# Patient Record
Sex: Male | Born: 1940
Health system: Southern US, Community
[De-identification: ages and names within clinical notes are randomized; demographics above are authoritative.]

## PROBLEM LIST (undated history)

## (undated) DIAGNOSIS — E1151 Type 2 diabetes mellitus with diabetic peripheral angiopathy without gangrene: Secondary | ICD-10-CM

## (undated) DIAGNOSIS — N529 Male erectile dysfunction, unspecified: Secondary | ICD-10-CM

## (undated) DIAGNOSIS — K7581 Nonalcoholic steatohepatitis (NASH): Secondary | ICD-10-CM

## (undated) DIAGNOSIS — G4730 Sleep apnea, unspecified: Secondary | ICD-10-CM

## (undated) DIAGNOSIS — M1990 Unspecified osteoarthritis, unspecified site: Secondary | ICD-10-CM

## (undated) DIAGNOSIS — D649 Anemia, unspecified: Secondary | ICD-10-CM

## (undated) DIAGNOSIS — E11319 Type 2 diabetes mellitus with unspecified diabetic retinopathy without macular edema: Secondary | ICD-10-CM

## (undated) DIAGNOSIS — E785 Hyperlipidemia, unspecified: Secondary | ICD-10-CM

## (undated) DIAGNOSIS — D126 Benign neoplasm of colon, unspecified: Secondary | ICD-10-CM

## (undated) DIAGNOSIS — K529 Noninfective gastroenteritis and colitis, unspecified: Secondary | ICD-10-CM

## (undated) DIAGNOSIS — E042 Nontoxic multinodular goiter: Secondary | ICD-10-CM

## (undated) HISTORY — PX: APPENDECTOMY: SHX54

## (undated) HISTORY — DX: Noninfective gastroenteritis and colitis, unspecified: K52.9

## (undated) HISTORY — DX: Benign neoplasm of colon, unspecified: D12.6

## (undated) HISTORY — DX: Unspecified osteoarthritis, unspecified site: M19.90

## (undated) HISTORY — DX: Sleep apnea, unspecified: G47.30

## (undated) HISTORY — DX: Nontoxic multinodular goiter: E04.2

## (undated) HISTORY — DX: Nonalcoholic steatohepatitis (NASH): K75.81

## (undated) HISTORY — DX: Hyperlipidemia, unspecified: E78.5

## (undated) HISTORY — DX: Male erectile dysfunction, unspecified: N52.9

## (undated) HISTORY — DX: Type 2 diabetes mellitus with unspecified diabetic retinopathy without macular edema: E11.319

## (undated) HISTORY — DX: Type 2 diabetes mellitus with diabetic peripheral angiopathy without gangrene: E11.51

## (undated) HISTORY — DX: Anemia, unspecified: D64.9

---
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
## 1995-01-31 DIAGNOSIS — K529 Noninfective gastroenteritis and colitis, unspecified: Secondary | ICD-10-CM

## 1995-01-31 HISTORY — DX: Noninfective gastroenteritis and colitis, unspecified: K52.9

## 1998-07-27 ENCOUNTER — Ambulatory Visit (HOSPITAL_COMMUNITY): Admission: RE | Admit: 1998-07-27 | Discharge: 1998-07-27 | Payer: Self-pay | Admitting: Internal Medicine

## 1998-07-27 ENCOUNTER — Encounter: Payer: Self-pay | Admitting: Internal Medicine

## 1999-11-17 ENCOUNTER — Encounter (INDEPENDENT_AMBULATORY_CARE_PROVIDER_SITE_OTHER): Payer: Self-pay | Admitting: Specialist

## 1999-11-17 ENCOUNTER — Other Ambulatory Visit: Admission: RE | Admit: 1999-11-17 | Discharge: 1999-11-17 | Payer: Self-pay | Admitting: Gastroenterology

## 1999-11-17 ENCOUNTER — Encounter: Payer: Self-pay | Admitting: Gastroenterology

## 2002-08-14 ENCOUNTER — Ambulatory Visit (HOSPITAL_BASED_OUTPATIENT_CLINIC_OR_DEPARTMENT_OTHER): Admission: RE | Admit: 2002-08-14 | Discharge: 2002-08-14 | Payer: Self-pay | Admitting: Internal Medicine

## 2002-12-28 ENCOUNTER — Inpatient Hospital Stay (HOSPITAL_COMMUNITY): Admission: EM | Admit: 2002-12-28 | Discharge: 2002-12-31 | Payer: Self-pay | Admitting: Emergency Medicine

## 2002-12-30 ENCOUNTER — Encounter (INDEPENDENT_AMBULATORY_CARE_PROVIDER_SITE_OTHER): Payer: Self-pay | Admitting: *Deleted

## 2002-12-31 ENCOUNTER — Encounter (INDEPENDENT_AMBULATORY_CARE_PROVIDER_SITE_OTHER): Payer: Self-pay | Admitting: *Deleted

## 2003-01-12 ENCOUNTER — Ambulatory Visit (HOSPITAL_COMMUNITY): Admission: RE | Admit: 2003-01-12 | Discharge: 2003-01-12 | Payer: Self-pay | Admitting: Endocrinology

## 2003-01-12 ENCOUNTER — Encounter: Payer: Self-pay | Admitting: Gastroenterology

## 2003-01-27 ENCOUNTER — Encounter: Payer: Self-pay | Admitting: Gastroenterology

## 2003-01-27 ENCOUNTER — Ambulatory Visit (HOSPITAL_COMMUNITY): Admission: RE | Admit: 2003-01-27 | Discharge: 2003-01-27 | Payer: Self-pay | Admitting: Gastroenterology

## 2003-01-31 HISTORY — PX: THORACOTOMY: SUR1349

## 2003-02-04 ENCOUNTER — Encounter: Payer: Self-pay | Admitting: Gastroenterology

## 2003-02-04 HISTORY — PX: ESOPHAGOGASTRODUODENOSCOPY: SHX1529

## 2003-02-16 ENCOUNTER — Inpatient Hospital Stay (HOSPITAL_COMMUNITY): Admission: EM | Admit: 2003-02-16 | Discharge: 2003-03-09 | Payer: Self-pay | Admitting: Emergency Medicine

## 2003-02-16 ENCOUNTER — Encounter (INDEPENDENT_AMBULATORY_CARE_PROVIDER_SITE_OTHER): Payer: Self-pay | Admitting: *Deleted

## 2003-02-17 ENCOUNTER — Encounter: Payer: Self-pay | Admitting: Gastroenterology

## 2003-02-17 ENCOUNTER — Encounter: Payer: Self-pay | Admitting: Cardiology

## 2003-02-18 ENCOUNTER — Encounter (INDEPENDENT_AMBULATORY_CARE_PROVIDER_SITE_OTHER): Payer: Self-pay | Admitting: *Deleted

## 2003-02-19 ENCOUNTER — Encounter (INDEPENDENT_AMBULATORY_CARE_PROVIDER_SITE_OTHER): Payer: Self-pay | Admitting: *Deleted

## 2003-02-19 ENCOUNTER — Encounter (INDEPENDENT_AMBULATORY_CARE_PROVIDER_SITE_OTHER): Payer: Self-pay | Admitting: Specialist

## 2003-02-24 ENCOUNTER — Encounter (INDEPENDENT_AMBULATORY_CARE_PROVIDER_SITE_OTHER): Payer: Self-pay | Admitting: Specialist

## 2003-03-09 ENCOUNTER — Encounter (INDEPENDENT_AMBULATORY_CARE_PROVIDER_SITE_OTHER): Payer: Self-pay | Admitting: *Deleted

## 2003-03-17 ENCOUNTER — Encounter: Admission: RE | Admit: 2003-03-17 | Discharge: 2003-03-17 | Payer: Self-pay | Admitting: Thoracic Surgery

## 2003-04-01 ENCOUNTER — Encounter: Admission: RE | Admit: 2003-04-01 | Discharge: 2003-04-01 | Payer: Self-pay | Admitting: Thoracic Surgery

## 2003-04-29 ENCOUNTER — Encounter: Admission: RE | Admit: 2003-04-29 | Discharge: 2003-04-29 | Payer: Self-pay | Admitting: Thoracic Surgery

## 2003-06-10 ENCOUNTER — Encounter: Admission: RE | Admit: 2003-06-10 | Discharge: 2003-06-10 | Payer: Self-pay | Admitting: Thoracic Surgery

## 2003-09-09 ENCOUNTER — Encounter: Admission: RE | Admit: 2003-09-09 | Discharge: 2003-09-09 | Payer: Self-pay | Admitting: Thoracic Surgery

## 2003-12-18 ENCOUNTER — Ambulatory Visit: Payer: Self-pay | Admitting: Endocrinology

## 2004-01-15 ENCOUNTER — Ambulatory Visit: Payer: Self-pay | Admitting: Endocrinology

## 2004-01-19 ENCOUNTER — Ambulatory Visit: Payer: Self-pay | Admitting: Endocrinology

## 2004-02-24 ENCOUNTER — Ambulatory Visit: Payer: Self-pay | Admitting: Endocrinology

## 2004-04-06 ENCOUNTER — Ambulatory Visit: Payer: Self-pay | Admitting: Endocrinology

## 2004-05-19 ENCOUNTER — Ambulatory Visit: Payer: Self-pay | Admitting: Endocrinology

## 2004-05-26 ENCOUNTER — Ambulatory Visit: Payer: Self-pay | Admitting: Endocrinology

## 2004-06-01 ENCOUNTER — Ambulatory Visit (HOSPITAL_COMMUNITY): Admission: RE | Admit: 2004-06-01 | Discharge: 2004-06-01 | Payer: Self-pay | Admitting: Endocrinology

## 2004-06-22 ENCOUNTER — Ambulatory Visit: Payer: Self-pay | Admitting: Pulmonary Disease

## 2004-08-18 ENCOUNTER — Ambulatory Visit: Payer: Self-pay | Admitting: Endocrinology

## 2004-08-25 ENCOUNTER — Ambulatory Visit: Payer: Self-pay | Admitting: Endocrinology

## 2004-12-08 ENCOUNTER — Ambulatory Visit: Payer: Self-pay | Admitting: Endocrinology

## 2004-12-14 ENCOUNTER — Ambulatory Visit: Payer: Self-pay | Admitting: Endocrinology

## 2005-01-04 ENCOUNTER — Ambulatory Visit: Payer: Self-pay | Admitting: Endocrinology

## 2005-01-30 HISTORY — PX: CORONARY ARTERY BYPASS GRAFT: SHX141

## 2005-02-14 ENCOUNTER — Ambulatory Visit: Payer: Self-pay | Admitting: Endocrinology

## 2005-02-23 ENCOUNTER — Ambulatory Visit: Payer: Self-pay | Admitting: Endocrinology

## 2005-05-04 ENCOUNTER — Ambulatory Visit: Payer: Self-pay | Admitting: Endocrinology

## 2005-05-09 ENCOUNTER — Ambulatory Visit: Payer: Self-pay | Admitting: Endocrinology

## 2005-08-01 ENCOUNTER — Ambulatory Visit: Payer: Self-pay | Admitting: Endocrinology

## 2005-08-08 ENCOUNTER — Ambulatory Visit: Payer: Self-pay | Admitting: Endocrinology

## 2005-10-18 IMAGING — NM NM HEPATO W/GB/PHARM/[PERSON_NAME]
1 series · 6 of 6 positions shown · non-contrast
Comparison: No prior nuclear medicine studies.  Abdominal ultrasound of 12/30/02 from [HOSPITAL] is correlated.

CLINICAL DATA: 62 year old with right upper quadrant abdominal pain, nausea/vomiting, and weight loss.

NUCLEAR MEDICINE HEPATOBILIARY IMAGING WITH GALLBLADDER EJECTION FRACTION DETERMINATION ? 01/27/03
TECHNIQUE: 8.4 millicuries of technetium ZZm-Sholetec were administered intravenously and static imaging over the right upper quadrant of the abdomen was performed at 5 minute intervals [DATE] minutes.  Imaging was also performed at 45 minutes and 1 hour.  At this point, the patient received an infusion of 2.0 micrograms of cholecystokinin and gallbladder ejection fraction was measured at 30 minutes and 60 minutes.

[hepatobiliary · 1.20mm/px · 6 of 8 frames shown]
[frame 1/8]
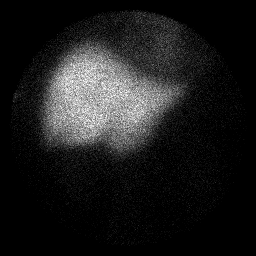
[frame 2/8]
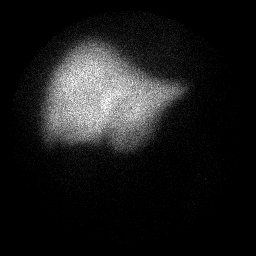
[frame 4/8]
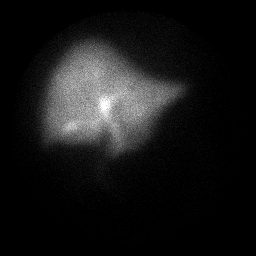
[frame 5/8]
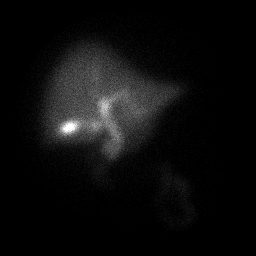
[frame 6/8]
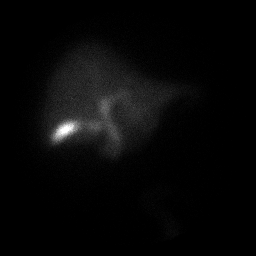
[frame 8/8]
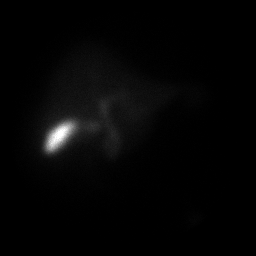

[6 of 6 positions shown; findings below may reference images not displayed]

FINDINGS: Hepatic uptake of the Choletec is normal.  Intrahepatic bile ducts are visualized by 10 minutes.  The common duct and small bowel activity is present by 20 minutes.  Gallbladder activity is noted by 20 minutes as well.  There is then progressive filling of the gallbladder throughout the remainder of the imaging sequence.

After cholecystokinin infusion, there is no excretion of the radiopharmaceutical from the gallbladder.  In fact, there continues to be progressive filling of the gallbladder, with the ejection fraction measured at -14.4 percent at one hour.

IMPRESSION
1.  Severe biliary dyskinesia, with absolutely no gallbladder response to cholecystokinin infusion.
2.  Patent common bile duct and cystic duct.

## 2005-10-31 ENCOUNTER — Ambulatory Visit: Payer: Self-pay | Admitting: Endocrinology

## 2005-11-07 ENCOUNTER — Ambulatory Visit: Payer: Self-pay | Admitting: Endocrinology

## 2006-01-09 ENCOUNTER — Ambulatory Visit: Payer: Self-pay | Admitting: Internal Medicine

## 2006-02-09 ENCOUNTER — Ambulatory Visit: Payer: Self-pay | Admitting: Endocrinology

## 2006-02-09 LAB — CONVERTED CEMR LAB
BUN: 23 mg/dL (ref 6–23)
CO2: 29 meq/L (ref 19–32)
Calcium: 8.7 mg/dL (ref 8.4–10.5)
Chloride: 108 meq/L (ref 96–112)
Creatinine, Ser: 1.1 mg/dL (ref 0.4–1.5)
Hgb A1c MFr Bld: 7.7 % — ABNORMAL HIGH (ref 4.6–6.0)
Potassium: 4.7 meq/L (ref 3.5–5.1)
Uric Acid, Serum: 10.1 mg/dL — ABNORMAL HIGH (ref 2.4–7.0)

## 2006-05-21 ENCOUNTER — Ambulatory Visit: Payer: Self-pay | Admitting: Endocrinology

## 2006-05-30 ENCOUNTER — Ambulatory Visit: Payer: Self-pay | Admitting: Pulmonary Disease

## 2006-06-05 ENCOUNTER — Ambulatory Visit: Payer: Self-pay | Admitting: Pulmonary Disease

## 2006-06-21 ENCOUNTER — Ambulatory Visit: Payer: Self-pay | Admitting: Endocrinology

## 2006-06-29 ENCOUNTER — Ambulatory Visit: Payer: Self-pay | Admitting: Internal Medicine

## 2006-06-29 LAB — CONVERTED CEMR LAB: TSH: 2.19 microintl units/mL (ref 0.35–5.50)

## 2006-07-05 ENCOUNTER — Ambulatory Visit: Payer: Self-pay

## 2006-07-11 ENCOUNTER — Ambulatory Visit: Payer: Self-pay | Admitting: Cardiology

## 2006-07-12 ENCOUNTER — Encounter (HOSPITAL_COMMUNITY): Admission: RE | Admit: 2006-07-12 | Discharge: 2006-07-13 | Payer: Self-pay | Admitting: Internal Medicine

## 2006-08-15 ENCOUNTER — Encounter: Payer: Self-pay | Admitting: Endocrinology

## 2006-08-15 DIAGNOSIS — E785 Hyperlipidemia, unspecified: Secondary | ICD-10-CM | POA: Insufficient documentation

## 2006-08-15 DIAGNOSIS — M1990 Unspecified osteoarthritis, unspecified site: Secondary | ICD-10-CM | POA: Insufficient documentation

## 2006-08-16 ENCOUNTER — Ambulatory Visit: Payer: Self-pay | Admitting: Endocrinology

## 2006-08-16 LAB — CONVERTED CEMR LAB
Basophils Relative: 1.2 % — ABNORMAL HIGH (ref 0.0–1.0)
Bilirubin Urine: NEGATIVE
Bilirubin, Direct: 0.1 mg/dL (ref 0.0–0.3)
CO2: 28 meq/L (ref 19–32)
Creatinine, Ser: 1.2 mg/dL (ref 0.4–1.5)
GFR calc Af Amer: 78 mL/min
HCT: 36.9 % — ABNORMAL LOW (ref 39.0–52.0)
HDL: 29.7 mg/dL — ABNORMAL LOW (ref >39.0)
Hemoglobin, Urine: NEGATIVE
Hemoglobin: 12.7 g/dL — ABNORMAL LOW (ref 13.0–17.0)
Hgb A1c MFr Bld: 7.9 % — ABNORMAL HIGH (ref 4.6–6.0)
Leukocytes, UA: NEGATIVE
Lymphocytes Relative: 20.8 % (ref 12.0–46.0)
MCHC: 34.4 g/dL (ref 30.0–36.0)
Monocytes Absolute: 1.1 10*3/uL — ABNORMAL HIGH (ref 0.2–0.7)
Neutro Abs: 3.7 10*3/uL (ref 1.4–7.7)
Neutrophils Relative %: 56.7 % (ref 43.0–77.0)
Potassium: 4.8 meq/L (ref 3.5–5.1)
RDW: 11.7 % (ref 11.5–14.6)
Sodium: 140 meq/L (ref 135–145)
Specific Gravity, Urine: 1.025 (ref 1.000–1.03)
Total Bilirubin: 0.7 mg/dL (ref 0.3–1.2)
Total Protein: 6.8 g/dL (ref 6.0–8.3)
Triglycerides: 169 mg/dL — ABNORMAL HIGH (ref 0–149)
Uric Acid, Serum: 11.2 mg/dL — ABNORMAL HIGH (ref 2.4–7.0)
Urobilinogen, UA: 0.2 (ref 0.0–1.0)
VLDL: 34 mg/dL (ref 0–40)
pH: 6 (ref 5.0–8.0)

## 2006-08-22 ENCOUNTER — Ambulatory Visit: Payer: Self-pay | Admitting: Endocrinology

## 2006-09-04 ENCOUNTER — Ambulatory Visit: Payer: Self-pay | Admitting: Internal Medicine

## 2006-09-04 LAB — CONVERTED CEMR LAB
BUN: 25 mg/dL — ABNORMAL HIGH (ref 6–23)
Basophils Absolute: 0 10*3/uL (ref 0.0–0.1)
Basophils Relative: 0.5 % (ref 0.0–1.0)
CO2: 28 meq/L (ref 19–32)
Eosinophils Absolute: 0.4 10*3/uL (ref 0.0–0.6)
GFR calc Af Amer: 78 mL/min
Hemoglobin: 12.9 g/dL — ABNORMAL LOW (ref 13.0–17.0)
Lymphocytes Relative: 16.6 % (ref 12.0–46.0)
MCHC: 34.1 g/dL (ref 30.0–36.0)
MCV: 90.7 fL (ref 78.0–100.0)
Monocytes Absolute: 1 10*3/uL — ABNORMAL HIGH (ref 0.2–0.7)
Monocytes Relative: 17 % — ABNORMAL HIGH (ref 3.0–11.0)
Neutro Abs: 3.5 10*3/uL (ref 1.4–7.7)
Potassium: 4.3 meq/L (ref 3.5–5.1)

## 2006-09-06 ENCOUNTER — Ambulatory Visit: Payer: Self-pay | Admitting: Cardiology

## 2006-09-06 ENCOUNTER — Inpatient Hospital Stay (HOSPITAL_BASED_OUTPATIENT_CLINIC_OR_DEPARTMENT_OTHER): Admission: RE | Admit: 2006-09-06 | Discharge: 2006-09-06 | Payer: Self-pay | Admitting: Cardiology

## 2006-09-07 ENCOUNTER — Ambulatory Visit: Payer: Self-pay | Admitting: Cardiology

## 2006-09-07 ENCOUNTER — Encounter: Payer: Self-pay | Admitting: Thoracic Surgery (Cardiothoracic Vascular Surgery)

## 2006-09-07 ENCOUNTER — Ambulatory Visit: Payer: Self-pay | Admitting: Thoracic Surgery (Cardiothoracic Vascular Surgery)

## 2006-09-07 ENCOUNTER — Ambulatory Visit (HOSPITAL_COMMUNITY): Admission: RE | Admit: 2006-09-07 | Discharge: 2006-09-08 | Payer: Self-pay | Admitting: Cardiology

## 2006-09-17 ENCOUNTER — Inpatient Hospital Stay (HOSPITAL_COMMUNITY)
Admission: RE | Admit: 2006-09-17 | Discharge: 2006-09-26 | Payer: Self-pay | Admitting: Thoracic Surgery (Cardiothoracic Vascular Surgery)

## 2006-09-24 ENCOUNTER — Ambulatory Visit: Payer: Self-pay | Admitting: Physical Medicine & Rehabilitation

## 2006-10-10 ENCOUNTER — Ambulatory Visit: Payer: Self-pay | Admitting: Internal Medicine

## 2006-10-12 ENCOUNTER — Encounter
Admission: RE | Admit: 2006-10-12 | Discharge: 2006-10-12 | Payer: Self-pay | Admitting: Thoracic Surgery (Cardiothoracic Vascular Surgery)

## 2006-10-12 ENCOUNTER — Ambulatory Visit: Payer: Self-pay | Admitting: Thoracic Surgery (Cardiothoracic Vascular Surgery)

## 2006-10-18 ENCOUNTER — Encounter (HOSPITAL_COMMUNITY): Admission: RE | Admit: 2006-10-18 | Discharge: 2007-01-16 | Payer: Self-pay | Admitting: Internal Medicine

## 2006-11-12 ENCOUNTER — Ambulatory Visit: Payer: Self-pay | Admitting: Thoracic Surgery (Cardiothoracic Vascular Surgery)

## 2006-12-10 ENCOUNTER — Telehealth (INDEPENDENT_AMBULATORY_CARE_PROVIDER_SITE_OTHER): Payer: Self-pay | Admitting: *Deleted

## 2006-12-12 ENCOUNTER — Ambulatory Visit: Payer: Self-pay | Admitting: Endocrinology

## 2006-12-12 LAB — CONVERTED CEMR LAB
AST: 23 units/L (ref 0–37)
Basophils Relative: 1.4 % — ABNORMAL HIGH (ref 0.0–1.0)
Bilirubin, Direct: 0.1 mg/dL (ref 0.0–0.3)
Chloride: 108 meq/L (ref 96–112)
Cholesterol: 124 mg/dL (ref 0–200)
Creatinine, Ser: 1.1 mg/dL (ref 0.4–1.5)
Eosinophils Relative: 3.7 % (ref 0.0–5.0)
Glucose, Bld: 183 mg/dL — ABNORMAL HIGH (ref 70–99)
HCT: 37.9 % — ABNORMAL LOW (ref 39.0–52.0)
Hgb A1c MFr Bld: 7.7 % — ABNORMAL HIGH (ref 4.6–6.0)
Ketones, ur: NEGATIVE mg/dL
LDL Cholesterol: 72 mg/dL (ref 0–99)
Neutrophils Relative %: 66.8 % (ref 43.0–77.0)
Nitrite: NEGATIVE
PSA: 0.25 ng/mL (ref 0.10–4.00)
RBC: 4.25 M/uL (ref 4.22–5.81)
RDW: 14.3 % (ref 11.5–14.6)
Sodium: 142 meq/L (ref 135–145)
Total Bilirubin: 0.7 mg/dL (ref 0.3–1.2)
Urobilinogen, UA: 0.2 (ref 0.0–1.0)
VLDL: 26 mg/dL (ref 0–40)
WBC: 7.3 10*3/uL (ref 4.5–10.5)
pH: 5.5 (ref 5.0–8.0)

## 2006-12-17 ENCOUNTER — Ambulatory Visit: Payer: Self-pay | Admitting: Endocrinology

## 2006-12-31 ENCOUNTER — Encounter: Payer: Self-pay | Admitting: Endocrinology

## 2007-01-15 ENCOUNTER — Ambulatory Visit: Payer: Self-pay | Admitting: Internal Medicine

## 2007-01-23 ENCOUNTER — Ambulatory Visit: Payer: Self-pay | Admitting: Endocrinology

## 2007-01-23 ENCOUNTER — Inpatient Hospital Stay (HOSPITAL_COMMUNITY): Admission: AD | Admit: 2007-01-23 | Discharge: 2007-01-27 | Payer: Self-pay | Admitting: Endocrinology

## 2007-01-23 DIAGNOSIS — N139 Obstructive and reflux uropathy, unspecified: Secondary | ICD-10-CM | POA: Insufficient documentation

## 2007-01-24 ENCOUNTER — Encounter (INDEPENDENT_AMBULATORY_CARE_PROVIDER_SITE_OTHER): Payer: Self-pay | Admitting: *Deleted

## 2007-02-11 ENCOUNTER — Ambulatory Visit: Payer: Self-pay | Admitting: Endocrinology

## 2007-02-11 LAB — CONVERTED CEMR LAB
Basophils Relative: 0 % (ref 0.0–1.0)
Crystals: NEGATIVE
Eosinophils Absolute: 0.3 10*3/uL (ref 0.0–0.6)
Eosinophils Relative: 4.9 % (ref 0.0–5.0)
Ferritin: 260.8 ng/mL (ref 22.0–322.0)
Folate: 10.6 ng/mL
HCT: 37.1 % — ABNORMAL LOW (ref 39.0–52.0)
Hemoglobin, Urine: NEGATIVE
Iron: 70 ug/dL (ref 42–165)
Leukocytes, UA: NEGATIVE
Lymphocytes Relative: 18.9 % (ref 12.0–46.0)
MCV: 88.9 fL (ref 78.0–100.0)
Mucus, UA: NEGATIVE
Neutrophils Relative %: 63.4 % (ref 43.0–77.0)
RBC: 4.17 M/uL — ABNORMAL LOW (ref 4.22–5.81)
Total Protein, Urine: NEGATIVE mg/dL
Vitamin B-12: 615 pg/mL (ref 211–911)
WBC: 6.3 10*3/uL (ref 4.5–10.5)

## 2007-02-12 LAB — CONVERTED CEMR LAB
Calcium, Total (PTH): 8.8 mg/dL (ref 8.4–10.5)
PTH: 53.5 pg/mL (ref 14.0–72.0)

## 2007-07-19 ENCOUNTER — Telehealth: Payer: Self-pay | Admitting: Pulmonary Disease

## 2007-07-19 ENCOUNTER — Telehealth: Payer: Self-pay | Admitting: Endocrinology

## 2007-08-05 ENCOUNTER — Ambulatory Visit: Payer: Self-pay | Admitting: Endocrinology

## 2007-08-05 LAB — CONVERTED CEMR LAB: Hgb A1c MFr Bld: 9.1 % — ABNORMAL HIGH (ref 4.6–6.0)

## 2007-08-07 ENCOUNTER — Ambulatory Visit: Payer: Self-pay | Admitting: Internal Medicine

## 2007-08-08 ENCOUNTER — Telehealth (INDEPENDENT_AMBULATORY_CARE_PROVIDER_SITE_OTHER): Payer: Self-pay | Admitting: *Deleted

## 2007-08-08 ENCOUNTER — Ambulatory Visit: Payer: Self-pay | Admitting: Endocrinology

## 2007-08-14 ENCOUNTER — Ambulatory Visit: Payer: Self-pay | Admitting: Internal Medicine

## 2007-08-30 ENCOUNTER — Encounter: Payer: Self-pay | Admitting: Endocrinology

## 2007-09-07 ENCOUNTER — Encounter: Payer: Self-pay | Admitting: Endocrinology

## 2007-09-11 ENCOUNTER — Telehealth (INDEPENDENT_AMBULATORY_CARE_PROVIDER_SITE_OTHER): Payer: Self-pay | Admitting: *Deleted

## 2007-09-30 ENCOUNTER — Ambulatory Visit: Payer: Self-pay | Admitting: Endocrinology

## 2007-09-30 LAB — CONVERTED CEMR LAB
BUN: 18 mg/dL (ref 6–23)
Creatinine, Ser: 1 mg/dL (ref 0.4–1.5)
GFR calc Af Amer: 96 mL/min
GFR calc non Af Amer: 80 mL/min
Glucose, Bld: 215 mg/dL — ABNORMAL HIGH (ref 70–99)

## 2007-10-03 ENCOUNTER — Ambulatory Visit: Payer: Self-pay | Admitting: Endocrinology

## 2007-10-20 ENCOUNTER — Encounter: Payer: Self-pay | Admitting: Pulmonary Disease

## 2007-11-07 ENCOUNTER — Ambulatory Visit: Payer: Self-pay | Admitting: Endocrinology

## 2007-12-12 ENCOUNTER — Ambulatory Visit: Payer: Self-pay | Admitting: Endocrinology

## 2007-12-12 LAB — CONVERTED CEMR LAB
Basophils Absolute: 0.1 10*3/uL (ref 0.0–0.1)
Basophils Relative: 0.8 % (ref 0.0–3.0)
CO2: 31 meq/L (ref 19–32)
Calcium: 9.2 mg/dL (ref 8.4–10.5)
Chloride: 101 meq/L (ref 96–112)
Creatinine, Ser: 1.4 mg/dL (ref 0.4–1.5)
Eosinophils Absolute: 0.4 10*3/uL (ref 0.0–0.7)
GFR calc non Af Amer: 54 mL/min
Hgb A1c MFr Bld: 8 % — ABNORMAL HIGH (ref 4.6–6.0)
Lymphocytes Relative: 19.4 % (ref 12.0–46.0)
MCHC: 34.1 g/dL (ref 30.0–36.0)
MCV: 90.6 fL (ref 78.0–100.0)
Neutro Abs: 5 10*3/uL (ref 1.4–7.7)
Neutrophils Relative %: 60.5 % (ref 43.0–77.0)
RDW: 12 % (ref 11.5–14.6)
Sodium: 139 meq/L (ref 135–145)

## 2008-01-08 ENCOUNTER — Telehealth: Payer: Self-pay | Admitting: Pulmonary Disease

## 2008-01-15 ENCOUNTER — Ambulatory Visit: Payer: Self-pay | Admitting: Internal Medicine

## 2008-01-15 ENCOUNTER — Encounter: Payer: Self-pay | Admitting: Internal Medicine

## 2008-01-15 LAB — CONVERTED CEMR LAB: Blood Glucose, Fingerstick: 212

## 2008-02-05 ENCOUNTER — Ambulatory Visit: Payer: Self-pay | Admitting: Pulmonary Disease

## 2008-02-05 DIAGNOSIS — G4733 Obstructive sleep apnea (adult) (pediatric): Secondary | ICD-10-CM | POA: Insufficient documentation

## 2008-02-12 ENCOUNTER — Ambulatory Visit: Payer: Self-pay | Admitting: Internal Medicine

## 2008-02-20 ENCOUNTER — Ambulatory Visit: Payer: Self-pay | Admitting: Internal Medicine

## 2008-02-20 ENCOUNTER — Ambulatory Visit: Payer: Self-pay | Admitting: Endocrinology

## 2008-02-20 DIAGNOSIS — E876 Hypokalemia: Secondary | ICD-10-CM | POA: Insufficient documentation

## 2008-02-20 LAB — CONVERTED CEMR LAB
BUN: 35 mg/dL — ABNORMAL HIGH
CO2: 30 meq/L
Calcium: 9 mg/dL
Chloride: 101 meq/L
Creatinine, Ser: 1.3 mg/dL
GFR calc Af Amer: 71 mL/min
GFR calc non Af Amer: 59 mL/min
Glucose, Bld: 184 mg/dL — ABNORMAL HIGH
Hgb A1c MFr Bld: 8.6 % — ABNORMAL HIGH
Potassium: 4.8 meq/L
Sodium: 139 meq/L

## 2008-04-21 ENCOUNTER — Telehealth (INDEPENDENT_AMBULATORY_CARE_PROVIDER_SITE_OTHER): Payer: Self-pay | Admitting: *Deleted

## 2008-04-23 ENCOUNTER — Encounter: Payer: Self-pay | Admitting: Endocrinology

## 2008-06-04 ENCOUNTER — Encounter: Payer: Self-pay | Admitting: Endocrinology

## 2008-06-16 ENCOUNTER — Encounter (INDEPENDENT_AMBULATORY_CARE_PROVIDER_SITE_OTHER): Payer: Self-pay | Admitting: *Deleted

## 2008-06-25 ENCOUNTER — Ambulatory Visit: Payer: Self-pay | Admitting: Endocrinology

## 2008-06-25 DIAGNOSIS — I509 Heart failure, unspecified: Secondary | ICD-10-CM | POA: Insufficient documentation

## 2008-06-25 LAB — CONVERTED CEMR LAB
BUN: 55 mg/dL — ABNORMAL HIGH (ref 6–23)
Basophils Absolute: 0 10*3/uL (ref 0.0–0.1)
Chloride: 103 meq/L (ref 96–112)
Eosinophils Absolute: 0.3 10*3/uL (ref 0.0–0.7)
GFR calc non Af Amer: 46.1 mL/min (ref >60)
Hgb A1c MFr Bld: 8.7 % — ABNORMAL HIGH (ref 4.6–6.5)
Lymphocytes Relative: 20.8 % (ref 12.0–46.0)
MCHC: 35.6 g/dL (ref 30.0–36.0)
Monocytes Relative: 14 % — ABNORMAL HIGH (ref 3.0–12.0)
Neutro Abs: 4.6 10*3/uL (ref 1.4–7.7)
Neutrophils Relative %: 60 % (ref 43.0–77.0)
PTH: 62.8 pg/mL (ref 14.0–72.0)
Platelets: 345 10*3/uL (ref 150.0–400.0)
Potassium: 5.6 meq/L — ABNORMAL HIGH (ref 3.5–5.1)
Pro B Natriuretic peptide (BNP): 21 pg/mL (ref 0.0–100.0)
RDW: 11.7 % (ref 11.5–14.6)
Sodium: 138 meq/L (ref 135–145)

## 2008-07-02 ENCOUNTER — Telehealth: Payer: Self-pay | Admitting: Gastroenterology

## 2008-07-06 ENCOUNTER — Ambulatory Visit: Payer: Self-pay | Admitting: Gastroenterology

## 2008-07-08 ENCOUNTER — Ambulatory Visit: Payer: Self-pay | Admitting: Internal Medicine

## 2008-07-09 ENCOUNTER — Telehealth: Payer: Self-pay | Admitting: Gastroenterology

## 2008-07-10 ENCOUNTER — Telehealth: Payer: Self-pay | Admitting: Gastroenterology

## 2008-07-10 DIAGNOSIS — R933 Abnormal findings on diagnostic imaging of other parts of digestive tract: Secondary | ICD-10-CM | POA: Insufficient documentation

## 2008-07-20 ENCOUNTER — Telehealth (INDEPENDENT_AMBULATORY_CARE_PROVIDER_SITE_OTHER): Payer: Self-pay | Admitting: *Deleted

## 2008-07-22 ENCOUNTER — Encounter: Payer: Self-pay | Admitting: Endocrinology

## 2008-07-29 ENCOUNTER — Encounter: Payer: Self-pay | Admitting: Gastroenterology

## 2008-07-29 ENCOUNTER — Encounter: Admission: RE | Admit: 2008-07-29 | Discharge: 2008-07-29 | Payer: Self-pay | Admitting: Urology

## 2008-08-07 DIAGNOSIS — I2510 Atherosclerotic heart disease of native coronary artery without angina pectoris: Secondary | ICD-10-CM | POA: Insufficient documentation

## 2008-08-13 ENCOUNTER — Ambulatory Visit: Payer: Self-pay | Admitting: Internal Medicine

## 2008-08-26 ENCOUNTER — Telehealth: Payer: Self-pay | Admitting: Gastroenterology

## 2008-09-07 ENCOUNTER — Encounter (HOSPITAL_COMMUNITY): Admission: RE | Admit: 2008-09-07 | Discharge: 2008-11-03 | Payer: Self-pay | Admitting: Internal Medicine

## 2008-09-07 ENCOUNTER — Ambulatory Visit: Payer: Self-pay | Admitting: Cardiology

## 2008-09-08 ENCOUNTER — Encounter: Payer: Self-pay | Admitting: Internal Medicine

## 2008-09-11 ENCOUNTER — Telehealth: Payer: Self-pay | Admitting: Internal Medicine

## 2008-10-01 ENCOUNTER — Ambulatory Visit: Payer: Self-pay | Admitting: Gastroenterology

## 2008-10-01 DIAGNOSIS — K7581 Nonalcoholic steatohepatitis (NASH): Secondary | ICD-10-CM | POA: Insufficient documentation

## 2008-10-01 DIAGNOSIS — E669 Obesity, unspecified: Secondary | ICD-10-CM | POA: Insufficient documentation

## 2008-10-02 ENCOUNTER — Encounter: Payer: Self-pay | Admitting: Gastroenterology

## 2008-10-07 ENCOUNTER — Ambulatory Visit (HOSPITAL_COMMUNITY): Admission: RE | Admit: 2008-10-07 | Discharge: 2008-10-07 | Payer: Self-pay | Admitting: Gastroenterology

## 2008-10-07 ENCOUNTER — Ambulatory Visit: Payer: Self-pay | Admitting: Gastroenterology

## 2008-10-07 ENCOUNTER — Encounter: Payer: Self-pay | Admitting: Gastroenterology

## 2008-10-07 LAB — HM COLONOSCOPY: HM Colonoscopy: NORMAL

## 2008-10-09 ENCOUNTER — Encounter: Payer: Self-pay | Admitting: Gastroenterology

## 2008-11-13 ENCOUNTER — Ambulatory Visit: Payer: Self-pay | Admitting: Endocrinology

## 2008-11-15 LAB — CONVERTED CEMR LAB
ALT: 44 units/L (ref 0–53)
BUN: 23 mg/dL (ref 6–23)
Basophils Relative: 1.4 % (ref 0.0–3.0)
Bilirubin Urine: NEGATIVE
CO2: 30 meq/L (ref 19–32)
Chloride: 102 meq/L (ref 96–112)
Cholesterol: 134 mg/dL (ref 0–200)
Eosinophils Relative: 5.7 % — ABNORMAL HIGH (ref 0.0–5.0)
HCT: 39.1 % (ref 39.0–52.0)
Hemoglobin, Urine: NEGATIVE
Hgb A1c MFr Bld: 7.1 % — ABNORMAL HIGH (ref 4.6–6.5)
Ketones, ur: NEGATIVE mg/dL
LDL Cholesterol: 64 mg/dL (ref 0–99)
Lymphs Abs: 1.6 10*3/uL (ref 0.7–4.0)
MCHC: 33.7 g/dL (ref 30.0–36.0)
MCV: 91.6 fL (ref 78.0–100.0)
Monocytes Absolute: 1 10*3/uL (ref 0.1–1.0)
Platelets: 351 10*3/uL (ref 150.0–400.0)
Potassium: 4.4 meq/L (ref 3.5–5.1)
RBC: 4.26 M/uL (ref 4.22–5.81)
TSH: 2.28 microintl units/mL (ref 0.35–5.50)
Total Protein: 7.1 g/dL (ref 6.0–8.3)
Urine Glucose: NEGATIVE mg/dL
Urobilinogen, UA: 0.2 (ref 0.0–1.0)
WBC: 7.2 10*3/uL (ref 4.5–10.5)

## 2008-11-17 ENCOUNTER — Encounter: Payer: Self-pay | Admitting: Endocrinology

## 2008-11-18 ENCOUNTER — Ambulatory Visit: Payer: Self-pay | Admitting: Endocrinology

## 2008-11-18 DIAGNOSIS — E349 Endocrine disorder, unspecified: Secondary | ICD-10-CM | POA: Insufficient documentation

## 2008-11-20 LAB — CONVERTED CEMR LAB
FSH: 5.2 milliintl units/mL (ref 1.4–18.1)
LH: 3.95 milliintl units/mL (ref 1.50–9.30)
Prolactin: 4.2 ng/mL

## 2008-11-29 ENCOUNTER — Encounter: Payer: Self-pay | Admitting: Endocrinology

## 2008-11-30 ENCOUNTER — Telehealth (INDEPENDENT_AMBULATORY_CARE_PROVIDER_SITE_OTHER): Payer: Self-pay | Admitting: *Deleted

## 2009-01-07 ENCOUNTER — Telehealth: Payer: Self-pay | Admitting: Endocrinology

## 2009-02-02 ENCOUNTER — Telehealth (INDEPENDENT_AMBULATORY_CARE_PROVIDER_SITE_OTHER): Payer: Self-pay | Admitting: *Deleted

## 2009-02-04 ENCOUNTER — Ambulatory Visit: Payer: Self-pay | Admitting: Pulmonary Disease

## 2009-03-05 ENCOUNTER — Telehealth (INDEPENDENT_AMBULATORY_CARE_PROVIDER_SITE_OTHER): Payer: Self-pay | Admitting: *Deleted

## 2009-03-06 ENCOUNTER — Encounter: Payer: Self-pay | Admitting: Endocrinology

## 2009-03-31 ENCOUNTER — Ambulatory Visit: Payer: Self-pay | Admitting: Endocrinology

## 2009-03-31 DIAGNOSIS — E291 Testicular hypofunction: Secondary | ICD-10-CM | POA: Insufficient documentation

## 2009-03-31 LAB — CONVERTED CEMR LAB
Hgb A1c MFr Bld: 7.7 % — ABNORMAL HIGH (ref 4.6–6.5)
Microalb, Ur: 1.1 mg/dL (ref 0.0–1.9)

## 2009-04-14 ENCOUNTER — Telehealth: Payer: Self-pay | Admitting: Endocrinology

## 2009-06-25 ENCOUNTER — Encounter: Payer: Self-pay | Admitting: Pulmonary Disease

## 2009-07-02 ENCOUNTER — Telehealth: Payer: Self-pay | Admitting: Internal Medicine

## 2009-07-05 ENCOUNTER — Encounter (INDEPENDENT_AMBULATORY_CARE_PROVIDER_SITE_OTHER): Payer: Self-pay | Admitting: *Deleted

## 2009-08-13 ENCOUNTER — Telehealth: Payer: Self-pay | Admitting: Endocrinology

## 2009-10-19 ENCOUNTER — Telehealth: Payer: Self-pay | Admitting: Endocrinology

## 2009-10-20 ENCOUNTER — Telehealth: Payer: Self-pay | Admitting: Endocrinology

## 2009-10-20 ENCOUNTER — Ambulatory Visit: Payer: Self-pay | Admitting: Endocrinology

## 2009-10-20 LAB — CONVERTED CEMR LAB
CO2: 27 meq/L (ref 19–32)
Calcium: 9.2 mg/dL (ref 8.4–10.5)
Glucose, Bld: 200 mg/dL — ABNORMAL HIGH (ref 70–99)
Sodium: 139 meq/L (ref 135–145)

## 2009-10-22 ENCOUNTER — Ambulatory Visit: Payer: Self-pay | Admitting: Endocrinology

## 2009-10-22 DIAGNOSIS — I10 Essential (primary) hypertension: Secondary | ICD-10-CM | POA: Insufficient documentation

## 2009-10-22 LAB — CONVERTED CEMR LAB
CO2: 25 meq/L (ref 19–32)
Calcium: 9.6 mg/dL (ref 8.4–10.5)
Glucose, Bld: 293 mg/dL — ABNORMAL HIGH (ref 70–99)
Sodium: 137 meq/L (ref 135–145)

## 2009-11-19 ENCOUNTER — Ambulatory Visit: Payer: Self-pay | Admitting: Endocrinology

## 2009-11-22 ENCOUNTER — Telehealth: Payer: Self-pay | Admitting: Endocrinology

## 2010-01-05 ENCOUNTER — Telehealth: Payer: Self-pay | Admitting: Endocrinology

## 2010-01-06 ENCOUNTER — Ambulatory Visit: Payer: Self-pay | Admitting: Endocrinology

## 2010-01-06 DIAGNOSIS — M109 Gout, unspecified: Secondary | ICD-10-CM | POA: Insufficient documentation

## 2010-01-06 LAB — CONVERTED CEMR LAB
CO2: 31 meq/L (ref 19–32)
Calcium: 9.5 mg/dL (ref 8.4–10.5)
Glucose, Bld: 57 mg/dL — ABNORMAL LOW (ref 70–99)
Potassium: 4.6 meq/L (ref 3.5–5.1)
Sodium: 139 meq/L (ref 135–145)

## 2010-01-29 ENCOUNTER — Encounter: Payer: Self-pay | Admitting: Endocrinology

## 2010-02-05 ENCOUNTER — Encounter: Payer: Self-pay | Admitting: Endocrinology

## 2010-02-14 ENCOUNTER — Telehealth: Payer: Self-pay | Admitting: Endocrinology

## 2010-02-17 ENCOUNTER — Telehealth: Payer: Self-pay | Admitting: Endocrinology

## 2010-02-19 ENCOUNTER — Encounter: Payer: Self-pay | Admitting: Internal Medicine

## 2010-02-25 ENCOUNTER — Ambulatory Visit: Admit: 2010-02-25 | Payer: Self-pay | Admitting: Endocrinology

## 2010-03-01 NOTE — Progress Notes (Signed)
  Phone Note Call from Patient Call back at [facility] Phone [phone]   Caller: Patient Call For: Dr Everardo All Summary of Call: Pt called back requesting  testosterone cream,[facility], [address][phone] Initial call taken by: Verdell Face,  February 02, 2009 12:00 PM  Follow-up for Phone Call        Please advise Follow-up by: Josph Macho CMA,  February 02, 2009 12:07 PM  Additional Follow-up for Phone Call Additional follow up Details #1::        i printed return 30 days Additional Follow-up by: Minus Breeding MD,  February 02, 2009 12:21 PM    Additional Follow-up for Phone Call Additional follow up Details #2::    Faxed RX. Informed pt to return in 30 days he states that he would call back to make an appt. Follow-up by: Josph Macho CMA,  February 02, 2009 2:37 PM  New/Updated Medications: ANDROGEL 25 MG/2.5GM GEL (TESTOSTERONE) 1 pack (2.5 grams) qd Prescriptions: ANDROGEL 25 MG/2.5GM GEL (TESTOSTERONE) 1 pack (2.5 grams) qd  #30 x 1   Entered and Authorized by:   Minus Breeding MD   Signed by:   Minus Breeding MD on 02/02/2009   Method used:   Print then Give to Patient   RxID:   [id number] ANDROGEL 25 MG/2.5GM GEL (TESTOSTERONE) 1 pack (2.5 grams) qd  #30 x 1   Entered and Authorized by:   Minus Breeding MD   Signed by:   Minus Breeding MD on 02/02/2009   Method used:   Print then Give to Patient   RxID:   [id number]

## 2010-03-01 NOTE — Progress Notes (Signed)
Summary: lab results  Phone Note Other Incoming   Caller: Lab Summary of Call: Critical lab on pt. Potassium was 6.1. Sample was taken this morning. Initial call taken by: Brenton Grills MA,  October 20, 2009 12:04 PM  Follow-up for Phone Call        please call patient: potassium is high.  stop lisinopril.  ret as scheduled. Follow-up by: Minus Breeding MD,  October 20, 2009 12:26 PM  Additional Follow-up for Phone Call Additional follow up Details #1::        pt informed Additional Follow-up by: Brenton Grills MA,  October 20, 2009 1:10 PM

## 2010-03-01 NOTE — Progress Notes (Signed)
  Phone Note Call from Patient Call back at Home Phone [phone]   Summary of Call: Patient called [facility] stating that he has a new insurance carrier and needs new rx for BD original pen needles  25G x 1/2in (#100/2boxes). Also need rx for BD short pen needles 31G x 5/6 in (#3boxes). Please advise if this is ok to send to [facility] in [place].Alvy Beal Archie CMA  August 13, 2009 2:49 PM   Follow-up for Phone Call        ok also, ov is due Follow-up by: Minus Breeding MD,  August 13, 2009 3:06 PM    New/Updated Medications: * PEN NEEDLE BD 25G x 1/2 in * BD SHORT 31G X 5/6 IN Test as directed Prescriptions: BD SHORT 31G X 5/6 IN Test as directed  #200 x 3   Entered by:   Rock Nephew CMA   Authorized by:   Minus Breeding MD   Signed by:   Rock Nephew CMA on 08/13/2009   Method used:   Faxed to ...       [facility]  [address] [phone]* (retail)       [address]       [place], [place]  [address]       Ph: [phone]       Fax: [phone]   RxID:   [id number] PEN NEEDLE BD 25G x 1/2 in  #200 x 3   Entered by:   Rock Nephew CMA   Authorized by:   Minus Breeding MD   Signed by:   Rock Nephew CMA on 08/13/2009   Method used:   Faxed to ...       [facility]  [address] [phone]* (retail)       [address]       [place], [place]  [address]       Ph: [phone]       Fax: [phone]   RxID:   [id number]

## 2010-03-01 NOTE — Progress Notes (Signed)
Summary: Insulin  Phone Note Call from Patient Call back at Home Phone [phone]   Caller: Patient Summary of Call: pt called stating that he is running out of Insulin before refill is due. pt says that he gets 2 boxes of Humalin N. pt is requesting refill to go to [facility] in EMR. Initial call taken by: Margaret Pyle, CMA,  April 14, 2009 2:26 PM  Follow-up for Phone Call        sent Follow-up by: Minus Breeding MD,  April 14, 2009 5:30 PM  Additional Follow-up for Phone Call Additional follow up Details #1::        pt informed Additional Follow-up by: Margaret Pyle, CMA,  April 15, 2009 8:29 AM    New/Updated Medications: HUMULIN N PEN 100 UNIT/ML  SUSP (INSULIN ISOPHANE HUMAN) 130 units at bedtime Prescriptions: HUMULIN N PEN 100 UNIT/ML  SUSP (INSULIN ISOPHANE HUMAN) 130 units at bedtime  #5 vials x 11   Entered and Authorized by:   Minus Breeding MD   Signed by:   Minus Breeding MD on 04/14/2009   Method used:   Electronically to        [facility]  [id number]* (retail)       [address]       [place], [place]  [address]       Ph: [phone]       Fax: [phone]   RxID:   [id number]

## 2010-03-01 NOTE — Progress Notes (Signed)
Summary: Labs prior  Phone Note Call from Patient Call back at Home Phone [phone]   Caller: Patient Summary of Call: Pt has appt schedule Friday Sept 23 @ 10:45. Pt is requesting to have labs done prior, specifically potassium, testosterone and A1C. Please advise Initial call taken by: Margaret Pyle, CMA,  October 19, 2009 3:41 PM  Follow-up for Phone Call        ok: bmet 276.8 a1c 250.01 testosterone 257.2 Follow-up by: Minus Breeding MD,  October 19, 2009 4:10 PM  Additional Follow-up for Phone Call Additional follow up Details #1::        Per pt, he will come tomorrow for labs Additional Follow-up by: Brenton Grills MA,  October 19, 2009 4:45 PM

## 2010-03-01 NOTE — Progress Notes (Signed)
Summary: Referral  Phone Note Call from Patient Call back at Home Phone [phone]   Caller: Patient Summary of Call: Pt called stating he recieved a call about referral to Optho. Pt says when he was in office he discuss with SAE bags around his eyes and he requested to be referred to a surgeon for this, no Optho. Please advise. Initial call taken by: Margaret Pyle, CMA,  November 22, 2009 8:53 AM  Follow-up for Phone Call        this is sometimes handled by plastic surg, but usually by opthal.  i am happy to refer you to plastic surgeon instead if you wish. Follow-up by: Minus Breeding MD,  November 22, 2009 9:06 AM  Additional Follow-up for Phone Call Additional follow up Details #1::        Pt advised and declied referral stating he will go to his regular opthmologist Additional Follow-up by: Margaret Pyle, CMA,  November 22, 2009 10:21 AM

## 2010-03-01 NOTE — Assessment & Plan Note (Signed)
Summary: YEARLY---STC   Vital Signs:  Patient profile:   70 year old male Height:      75 inches (190.50 cm) Weight:      391.50 pounds (177.95 kg) BMI:     49.11 O2 Sat:      92 % on Room air Temp:     97.8 degrees F (36.56 degrees C) oral Pulse rate:   84 / minute BP sitting:   138 / 74  (left arm) Cuff size:   large  Vitals Entered By: Brenton Grills MA (November 19, 2009 9:04 AM)  O2 Flow:  Room air CC: Yearly visit/aj Is Patient Diabetic? Yes   Primary Reyhan Moronta:  Lorna Few  CC:  Yearly visit/aj.  History of Present Illness: pt is here for medicare welllness visit.  he denies memory loss and depression.  he says he is able to perform activities of daily living without assistance.  he has no limitations to physical activity, except for doe.  no cbg record, but states cbg's are 170 am, and 150 at lunch.  he does not check at other times of day.  he takes only 110 units at bedtime.    Current Medications (verified): 1)  Adult Aspirin Low Strength 81 Mg  Tbdp (Aspirin) .... Take 1 Tablet By Mouth Four Times A Day 2)  Pen Needle Bd .Marland Kitchen.. 25g X 1/2 In 3)  Bd Insulin Syringe 30g X 1/2" 0.5 Ml  Misc (Insulin Syringe-Needle U-100) .... Use As Directed 4)  Humulin N Pen 100 Unit/ml  Susp (Insulin Isophane Human) .Marland KitchenMarland KitchenMarland Kitchen 130 Units At Bedtime 5)  Humalog Kwikpen 100 Unit/ml Soln (Insulin Lispro (Human)) .... Three Times A Day (Qac) 45-40-85 Units 6)  Bd Short 31g X 5/6 in .... Test As Directed 7)  Salmon Oil  Caps (Nutritional Supplements) .... 1000mg  1 Capsule Once Daily 8)  Testim 50 Mg/5gm Gel (Testosterone) .... 2x5 Grams Once Daily 9)  Simvastatin 80 Mg Tabs (Simvastatin) .... 1/2 Tab Once Daily 10)  Furosemide 80 Mg Tabs (Furosemide) .... 1/2 Tab Once Daily  Allergies (verified): 1)  Lamisil (Terbinafine Hcl)  Past History:  Past Medical History: Hyperlipidemia Osteoarthritis Low Back Pain Mutinodular Goiter DM Peripheral Sleep Apnea NASH DM  Retinopathy E.D. Hx of colitis in 1997, nonspecific UNSPECIFIED ANEMIA (ICD-285.9) OSTEOARTHRITIS (ICD-715.90) HYPERLIPIDEMIA (ICD-272.4) DIABETES MELLITUS, TYPE I (ICD-250.01)  cardiol: klein pulm: clance derm: gould  Family History: Reviewed history from 08/07/2008 and no changes required. No FH of Colon Cancer: Family History of Crohn's:father no cancer  Social History: Reviewed history from 11/18/2008 and no changes required. married laid off 2010--retired Alcohol use-rare Drug use-no Regular exercise-swimming once daily Patient is a former smoker.  Daily Caffeine Use - 1-2 cups per day. illegal drugs: none  Review of Systems  The patient denies decreased hearing.         he has little, if any, visual loss  Physical Exam  General:  morbidly obese.  no distress  Eyes:  (sees opthal) Ears:  grossly normal hearing.   Rectal:  Normal external exam. Normal sphincter tone with no palpable masses.  Prostate:  Normal size prostate without masses or tenderness.  Msk:  pt easily and quickly performs "get-up-and-go" from a sitting position  Psych:  remembers 3/3 at 5 minutes.  excellent recall.  can easily read and write a sentence.  alert and oriented x 3.   Impression & Recommendations:  Problem # 1:  ROUTINE GENERAL MEDICAL EXAM@HEALTH  CARE FACL (ICD-V70.0)  Other Orders: DRE (  [place].April) Ophthalmology Referral (Ophthalmology) Medicare -1st Annual Wellness Visit [phone])   Patient Instructions: 1)  increase nph to 130 units at bedtime. 2)  Please schedule a follow-up appointment in 3 months. 3)  refer opthalmology.  you will be called with a day and time for an appointment 4)  please consider these measures for your health:  minimize alcohol.  do not use tobacco products.  have a colonoscopy at least every 10 years from age 34.  keep firearms safely stored.  always use seat belts.  have working smoke alarms in your home.  see an eye doctor and dentist regularly.   never drive under the influence of alcohol or drugs (including prescription drugs).  those with fair skin should take precautions against the sun. 5)  please let me know what your wishes would be, if artificial life support measures should become necessary.  it is critically important to prevent falling down (keep floor areas well-lit, dry, and free of loose objects) 6)  here is a list of medicare-covered preventive services.  7)  check your blood sugar 2 times a day.  vary the time of day when you check, between before the 3 meals, and at bedtime.  also check if you have symptoms of your blood sugar being too high or too low.  please keep a record of the readings and bring it to your next appointment here.  please call us sooner if you are having low blood sugar episodes.   Orders Added: 1)  DRE [G0102] 2)  Ophthalmology Referral [Ophthalmology] 3)  Medicare -1st Annual Wellness Visit [G0438]

## 2010-03-01 NOTE — Progress Notes (Signed)
Summary: diabetic shoes  Phone Note Call from Patient Call back at Home Phone [phone]   Caller: Patient---[phone] Call For: Minus Breeding MD Summary of Call: Pt wants prescription for diabetic shoes & socks. Pt is at an appt nearby on [address], pt hoping to pick up prescription this am. I made pt aware  the MD seeing pt's at the moment but would relay message. Pt is requesting for the prescription to be mail to his home address, Thanks  Initial call taken by: Verdell Face,  January 05, 2010 10:11 AM  Follow-up for Phone Call        i printed Follow-up by: Minus Breeding MD,  January 05, 2010 12:54 PM  Additional Follow-up for Phone Call Additional follow up Details #1::        rx mailed to pt's address on file Additional Follow-up by: Brenton Grills CMA Duncan Dull),  January 06, 2010 9:14 AM    New/Updated Medications: * DIABETIC SHOES AND SOCKS 782.3 and 250.01 Prescriptions: DIABETIC SHOES AND SOCKS 782.3 and 250.01  #1 pr of each x 0   Entered and Authorized by:   Minus Breeding MD   Signed by:   Minus Breeding MD on 01/05/2010   Method used:   Print then Give to Patient   RxID:   [id number]

## 2010-03-01 NOTE — Letter (Signed)
Summary: CMN/[facility]  CMN/[facility]   Imported By: Lester [place] 07/02/2009 07:36:27  _____________________________________________________________________  External Attachment:    Type:   Image     Comment:   External Document

## 2010-03-01 NOTE — Assessment & Plan Note (Signed)
Summary: FOLLOW UP/FLU SHOT-LB   Vital Signs:  Patient profile:   70 year old male Height:      75 inches (190.50 cm) Weight:      358 pounds (162.73 kg) BMI:     44.91 O2 Sat:      97 % on Room air Temp:     97.7 degrees F (36.50 degrees C) oral Pulse rate:   73 / minute BP sitting:   142 / 72  (left arm) Cuff size:   large  Vitals Entered By: Brenton Grills MA (October 22, 2009 10:59 AM)  O2 Flow:  Room air CC: Follow-up visit/Flu shot/aj Is Patient Diabetic? Yes   Primary Provider:  Lorna Few  CC:  Follow-up visit/Flu shot/aj.  History of Present Illness: the status of at least 3 ongoing medical problems is addressed today: hyperkalemia:  was noted a few days ago.  he stopped the zestril.  no muscle weakness. htn:  no change in chronic doe. dm:  pt says he is not checking cbg's recently.  no sxs of hypoglycemia.  he is discouraged by frequency of cbg monitoring.    Current Medications (verified): 1)  Adult Aspirin Low Strength 81 Mg  Tbdp (Aspirin) .... Take 1 Tablet By Mouth Four Times A Day 2)  Simvastatin 40 Mg  Tabs (Simvastatin) .... Take 1 By Mouth Qd 3)  Pen Needle Bd .Marland Kitchen.. 25g X 1/2 In 4)  Bd Insulin Syringe 30g X 1/2" 0.5 Ml  Misc (Insulin Syringe-Needle U-100) .... Use As Directed 5)  Lisinopril 20 Mg  Tabs (Lisinopril) .... Take 1 By Mouth Qd 6)  Humulin N Pen 100 Unit/ml  Susp (Insulin Isophane Human) .Marland KitchenMarland KitchenMarland Kitchen 130 Units At Bedtime 7)  Furosemide 40 Mg Tabs (Furosemide) .... Take 1 Tablet By Mouth Once A Day 8)  Humalog Kwikpen 100 Unit/ml Soln (Insulin Lispro (Human)) .... Three Times A Day (Qac) 45-40-85 Units 9)  Androgel 50 Mg/5gm Gel (Testosterone) .Marland Kitchen.. 1 Pack (50 Mg) Once Daily 10)  Bd Short 31g X 5/6 in .... Test As Directed 11)  Salmon Oil  Caps (Nutritional Supplements) .... 1000mg  1 Capsule Once Daily  Allergies (verified): 1)  Lamisil (Terbinafine Hcl)  Review of Systems  The patient denies weight loss and weight gain.    Physical  Exam  General:  morbidly obese.  no distress  Psych:  Alert and cooperative; normal mood and affect; normal attention span and concentration.   Additional Exam:   Potassium            [H]  5.8 mEq/L      Impression & Recommendations:  Problem # 1:  DIABETES MELLITUS-TYPE II (ICD-250.00) needs increased rx  HgbA1C: 8.1 (10/20/2009)  Problem # 2:  HYPOGONADISM (ICD-257.2) needs increased rx  Problem # 3:  HYPERKALEMIA (ICD-276.7) Assessment: New prob due to zestril, in a susceptable patient  Problem # 4:  HYPERTENSION (ICD-401.9) prob needs increased rx, but will follow for now  Medications Added to Medication List This Visit: 1)  Salmon Oil Caps (Nutritional supplements) .... 1000mg  1 capsule once daily 2)  Testim 50 Mg/5gm Gel (Testosterone) .... 2x5 grams once daily 3)  Simvastatin 80 Mg Tabs (Simvastatin) .... 1/2 tab once daily 4)  Furosemide 80 Mg Tabs (Furosemide) .... 1/2 tab once daily  Other Orders: Flu Vaccine 31yrs + MEDICARE PATIENTS (Z6109) Administration Flu vaccine - MCR (G0008) TLB-BMP (Basic Metabolic Panel-BMET) (80048-METABOL) Est. Patient Level IV (60454)  Patient Instructions: 1)  blood tests are being ordered  for you today.  please call [phone] to hear your test results. 2)  pending the test results, please stay-off the lisinopril for now. 3)  also, try to avoid any potassium-containing supplements. 4)  check your blood sugar 1 time a day.  vary the time of day when you check, between before the 3 meals, and at bedtime.  also check if you have symptoms of your blood sugar being too high or too low.  please keep a record of the readings and bring it to your next appointment here.  please call us sooner if you are having low blood sugar episodes. 5)  call me and tell me what time of day the blood sugar is highest, so we can safely increase insulin.    6)  change androgel to testim 2x5 grams once daily 7)  Please schedule a follow-up appointment in 1  month. 8)  (update: i left message on phone-tree:  rx as we discussed) Prescriptions: SIMVASTATIN 80 MG TABS (SIMVASTATIN) 1/2 tab once daily  #90 x 1   Entered and Authorized by:   Minus Breeding MD   Signed by:   Minus Breeding MD on 10/22/2009   Method used:   Electronically to        [facility] (retail)       [address] [place], [place]  [address]       Ph: [phone]       Fax: [phone]   RxID:   [id number] FUROSEMIDE 80 MG TABS (FUROSEMIDE) 1/2 tab once daily  #90 x 3   Entered and Authorized by:   Minus Breeding MD   Signed by:   Minus Breeding MD on 10/22/2009   Method used:   Electronically to        [facility]* (retail)       [address] [place], [place]  [address]       Ph: [phone]       Fax: [phone]   RxID:   [id number] SIMVASTATIN 80 MG TABS (SIMVASTATIN) 1/2 tab once daily  #90 x 1   Entered and Authorized by:   Minus Breeding MD   Signed by:   Minus Breeding MD on 10/22/2009   Method used:   Print then Give to Patient   RxID:   [id number] TESTIM 50 MG/5GM GEL (TESTOSTERONE) 2x5 grams once daily  #60 x 1   Entered and Authorized by:   Minus Breeding MD   Signed by:   Minus Breeding MD on 10/22/2009   Method used:   Print then Give to Patient   RxID:   [id number]                    Flu Vaccine Consent Questions     Do you have a history of severe allergic reactions to this vaccine? no    Any prior history of allergic reactions to egg and/or gelatin? no    Do you have a sensitivity to the preservative Thimersol? no    Do you have a past history of Guillan-Barre Syndrome? no    Do you currently have an acute febrile illness? no    Have you ever had a severe reaction to latex? no    Vaccine information given and explained to patient? yes    Are you currently pregnant? no  Lot Number:[id number]   Exp Date:07/30/2010   Site Given  Right Deltoid IMlu

## 2010-03-01 NOTE — Progress Notes (Signed)
Summary: Jury Duty  Phone Note Call from Patient   Caller: Patient Reason for Call: Talk to Doctor Summary of Call: Pt left msg on vm stating recieved letter for jury duty. Pt states he can't do jury duty taking a diurectic and have to use bathroom every 15 minutes. Requesting md to write him a letter excusing him from [place] duty. called pt to let him know md is out of office until Monday. will get back in contact with him on Monday with md response Initial call taken by: Orlan Leavens,  July 02, 2009 1:53 PM  Follow-up for Phone Call        ok for jury duty excuse Follow-up by: Corwin Levins MD,  July 02, 2009 4:32 PM  Additional Follow-up for Phone Call Additional follow up Details #1::        Letter generated and mailed to home address (verified) per pt request. Additional Follow-up by: Margaret Pyle, CMA,  July 05, 2009 10:09 AM

## 2010-03-01 NOTE — Assessment & Plan Note (Signed)
Summary: rov for osa   Primary Provider/Referring Provider:  Lorna Few  CC:  Pt is here for a 1 yr f/u appt.  Pt states he is wearing his cpap machine every night. Approx 8 hours per night.  Pt denied any complaints with mask or pressure.  Marland Kitchen  History of Present Illness: the pt comes in today for his yearly f/u of osa.  He is wearing his cpap compliantly, and is having no issues with mask fit or pressure.  He feels that he sleeps well, and is having excellent daytime alertness.  He is considering gastric bypass surgery, and I would support this given his multiple medical issues.  Medications Prior to Update: 1)  Adult Aspirin Low Strength 81 Mg  Tbdp (Aspirin) .... Take 1 Tablet By Mouth Four Times A Day 2)  Simvastatin 40 Mg  Tabs (Simvastatin) .... Take 1 By Mouth Qd 3)  Bd Ultra-Fine Pen Needles 29g X 12.47mm  Misc (Insulin Pen Needle) .... Use As Directed 4)  Bd Insulin Syringe 30g X 1/2" 0.5 Ml  Misc (Insulin Syringe-Needle U-100) .... Use As Directed 5)  Lisinopril 20 Mg  Tabs (Lisinopril) .... Take 1 By Mouth Qd 6)  Humulin N Pen 100 Unit/ml  Susp (Insulin Isophane Human) .Marland Kitchen.. 110 7)  Furosemide 40 Mg Tabs (Furosemide) .... Take 1 Tablet By Mouth Once A Day 8)  Humalog Kwikpen 100 Unit/ml Soln (Insulin Lispro (Human)) .... Three Times A Day (Qac) 45-30-85 Units 9)  Androgel 25 Mg/2.5gm Gel (Testosterone) .Marland Kitchen.. 1 Pack (2.5 Grams) Qd  Allergies (verified): 1)  Lamisil (Terbinafine Hcl)  Review of Systems      See HPI  Vital Signs:  Patient profile:   70 year old male Height:      75 inches Weight:      384.50 pounds O2 Sat:      94 % on Room air Temp:     97.5 degrees F oral Pulse rate:   92 / minute BP sitting:   132 / 80  (right arm) Cuff size:   large  Vitals Entered By: Arman Filter LPN (February 04, 2009 3:54 PM)  O2 Flow:  Room air CC: Pt is here for a 1 yr f/u appt.  Pt states he is wearing his cpap machine every night. Approx 8 hours per night.  Pt denied any  complaints with mask or pressure.   Comments Medications reviewed with patient Arman Filter LPN  February 04, 2009 4:04 PM    Physical Exam  General:  obese male in nad Nose:  no skin breakdown or pressure necrosis from cpap mask Neurologic:  alert, not sleepy  moves all 4.   Impression & Recommendations:  Problem # 1:  OBSTRUCTIVE SLEEP APNEA (ICD-327.23)  the pt is doing well with cpap, and feels he sleeps well and satisfactory daytime alertness.  I have encouraged him to keep up with mask changes and supplies, as well as working on weight loss.  Followup will be in one year.  Other Orders: Est. Patient Level II ([address])  Patient Instructions: 1)  no change in cpap 2)  work on weight loss 3)  followup with me in one year.

## 2010-03-01 NOTE — Assessment & Plan Note (Signed)
Summary: FU ON TESTOSTERONE/ LABS? /NWS   Vital Signs:  Patient profile:   70 year old male Height:      75 inches (190.50 cm) Weight:      380 pounds (172.73 kg) O2 Sat:      96 % on Room air Temp:     97.9 degrees F (36.61 degrees C) oral Pulse rate:   79 / minute BP sitting:   132 / 68  (left arm) Cuff size:   large  Vitals Entered By: Josph Macho RMA (March 31, 2009 8:17 AM)  O2 Flow:  Room air CC: Follow-up visit on testosterone/ CF Is Patient Diabetic? Yes   Primary Provider:  Lorna Few  CC:  Follow-up visit on testosterone/ CF.  History of Present Illness: pt says he has redoubled his exercise effort.  he feels better on the androgel. no cbg record, but states cbg's are well-controlled.  he has increased lunch humalog to 40 units.  Current Medications (verified): 1)  Adult Aspirin Low Strength 81 Mg  Tbdp (Aspirin) .... Take 1 Tablet By Mouth Four Times A Day 2)  Simvastatin 40 Mg  Tabs (Simvastatin) .... Take 1 By Mouth Qd 3)  Bd Ultra-Fine Pen Needles 29g X 12.67mm  Misc (Insulin Pen Needle) .... Use As Directed 4)  Bd Insulin Syringe 30g X 1/2" 0.5 Ml  Misc (Insulin Syringe-Needle U-100) .... Use As Directed 5)  Lisinopril 20 Mg  Tabs (Lisinopril) .... Take 1 By Mouth Qd 6)  Humulin N Pen 100 Unit/ml  Susp (Insulin Isophane Human) .Marland Kitchen.. 110 7)  Furosemide 40 Mg Tabs (Furosemide) .... Take 1 Tablet By Mouth Once A Day 8)  Humalog Kwikpen 100 Unit/ml Soln (Insulin Lispro (Human)) .... Three Times A Day (Qac) 45-30-85 Units 9)  Androgel 25 Mg/2.5gm Gel (Testosterone) .Marland Kitchen.. 1 Pack (2.5 Grams) Qd  Allergies (verified): 1)  Lamisil (Terbinafine Hcl)  Past History:  Past Medical History: Last updated: 10/01/2008 Hyperlipidemia Osteoarthritis Low Back Pain Mutinodular Goiter DM Peripheral Sleep Apnea NASH DM Retinopathy E.D. Hx of colitis in 1997, nonspecific UNSPECIFIED ANEMIA (ICD-285.9) OSTEOARTHRITIS (ICD-715.90) HYPERLIPIDEMIA  (ICD-272.4) DIABETES MELLITUS, TYPE I (ICD-250.01)  Review of Systems  The patient denies hypoglycemia.    Physical Exam  General:  morbidly obese.   Extremities:  2+ right pedal edema and 2+ left pedal edema.   Additional Exam:  Hemoglobin A1C       [H]  7.7 %                       4.6-6.5 Microalbumin Ratio        5.6 mg/g                    0.0-30.0 Testosterone         [L]  118.68 ng/dL     Impression & Recommendations:  Problem # 1:  DIABETES MELLITUS-TYPE II (ICD-250.00) needs increased rx  Problem # 2:  HYPOGONADISM (ICD-257.2) needs increased rx  Medications Added to Medication List This Visit: 1)  Humalog Kwikpen 100 Unit/ml Soln (Insulin lispro (human)) .... Three times a day (qac) 45-40-85 units 2)  Androgel 50 Mg/5gm Gel (Testosterone) .Marland Kitchen.. 1 pack (50 mg) once daily  Other Orders: TLB-A1C / Hgb A1C (Glycohemoglobin) (83036-A1C) TLB-Microalbumin/Creat Ratio, Urine (82043-MALB) TLB-Testosterone, Total (84403-TESTO) Est. Patient Level III ([address])  [facility] Instructions: [facility] 2)  tests are being ordered for you today.  a few days after the test(s), please call  [phone] to hear your test results. 3)  pending the test results, please continue the same medications for now. 4)  (update: i left message on phone-tree:  double androgel to 5 grams once daily.  by checking cbg, find out what time of day it is highest.  then increase the prior injection starting the next day). Prescriptions: HUMALOG KWIKPEN 100 UNIT/ML SOLN (INSULIN LISPRO (HUMAN)) three times a day (qac) 45-40-85 units  #1 box x 11   Entered and Authorized by:   Minus Breeding MD   Signed by:   Minus Breeding MD on 03/31/2009   Method used:   Print then Give to Patient   RxID:   [id number]

## 2010-03-01 NOTE — Progress Notes (Signed)
Summary: [facility]  Phone Note Other Incoming   Summary of Call: Received paperwork from [place]. Put on md's desk. Initial call taken by: Josph Macho CMA,  March 05, 2009 11:30 AM     Appended Document: Felisa Bonier Faxed completed paperwork. Also sent a copy to be scanned.

## 2010-03-01 NOTE — Medication Information (Signed)
Summary: RX Folder  RX Folder   Imported By: Sherian Rein 03/09/2009 10:53:33  _____________________________________________________________________  External Attachment:    Type:   Image     Comment:   External Document

## 2010-03-01 NOTE — Letter (Signed)
Summary: Generic Letter   [facility]  [address] [place], [place] [address]   Phone: [phone]  Fax: [phone]    07/05/2009  [name] [address] [place], [place]  [address]  RE: [name]   To whom it may concern;   This letter is to excuse the above named patient from [employer]. Mr. Pickup is currently taking a Diuretic, causing him to make frequent trips the restroom.   If you have any further questions or concerns, please contact my office at [phone].           Sincerely,   Oliver Barre MD/Dahlia Marnette Burgess, CMA

## 2010-03-03 NOTE — Letter (Signed)
Summary: CMN for Supplies / [facility]  CMN for Supplies / [facility]   Imported By: Lennie Odor 02/10/2010 16:51:52  _____________________________________________________________________  External Attachment:    Type:   Image     Comment:   External Document

## 2010-03-03 NOTE — Assessment & Plan Note (Signed)
Summary: gout flare-up/lb   Vital Signs:  Patient profile:   70 year old male Height:      75 inches (190.50 cm) Weight:      386 pounds (175.45 kg) BMI:     48.42 O2 Sat:      97 % on Room air Temp:     98.0 degrees F (36.67 degrees C) oral Pulse rate:   87 / minute Pulse rhythm:   regular BP sitting:   124 / 70  (right arm) Cuff size:   large  Vitals Entered By: Brenton Grills CMA Duncan Dull) (January 06, 2010 1:45 PM)  O2 Flow:  Room air CC: Pt c/o gout flare up/aj Is Patient Diabetic? Yes   Primary Provider:  Lorna Few  CC:  Pt c/o gout flare up/aj.  History of Present Illness: pt states few days of severe pain at both ankles (left > right), but no assoc numbness.  sxs are improved with motrin.   edema is slightly worse.  he takes lasix as rx'ed.   he has not recently been checking cbg's, as he has become discouraged.   Current Medications (verified): 1)  Adult Aspirin Low Strength 81 Mg  Tbdp (Aspirin) .... Take 1 Tablet By Mouth Four Times A Day 2)  Pen Needle Bd .Marland Kitchen.. 25g X 1/2 In 3)  Bd Insulin Syringe 30g X 1/2" 0.5 Ml  Misc (Insulin Syringe-Needle U-100) .... Use As Directed 4)  Humulin N Pen 100 Unit/ml  Susp (Insulin Isophane Human) .Marland KitchenMarland KitchenMarland Kitchen 130 Units At Bedtime 5)  Humalog Kwikpen 100 Unit/ml Soln (Insulin Lispro (Human)) .... Three Times A Day (Qac) 45-40-85 Units 6)  Diabetic Shoes and Socks .Marland Kitchen.. 782.3 and 250.01 7)  Salmon Oil  Caps (Nutritional Supplements) .... 1000mg  1 Capsule Once Daily 8)  Testim 50 Mg/5gm Gel (Testosterone) .... 2x5 Grams Once Daily 9)  Simvastatin 80 Mg Tabs (Simvastatin) .... 1/2 Tab Once Daily 10)  Furosemide 80 Mg Tabs (Furosemide) .... 1/2 Tab Once Daily 11)  Vitamin D 400 Unit Tabs (Cholecalciferol) .Marland Kitchen.. 1 By Mouth Once Daily 12)  Multivitamins  Tabs (Multiple Vitamin) .Marland Kitchen.. 1 By Mouth Once Daily  Allergies (verified): 1)  Lamisil (Terbinafine Hcl)  Past History:  Past Medical History: Last updated:  11/19/2009 Hyperlipidemia Osteoarthritis Low Back Pain Mutinodular Goiter DM Peripheral Sleep Apnea NASH DM Retinopathy E.D. Hx of colitis in 1997, nonspecific UNSPECIFIED ANEMIA (ICD-285.9) OSTEOARTHRITIS (ICD-715.90) HYPERLIPIDEMIA (ICD-272.4) DIABETES MELLITUS, TYPE I (ICD-250.01)  cardiol: klein pulm: clance derm: gould  Review of Systems  The patient denies fever and dyspnea on exertion.    Physical Exam  General:  morbidly obese.  no distress  Pulses:  dorsalis pedis intact bilat.  no carotid bruit  Extremities:  no deformity.  no ulcer on the feet.  feet are of normal color and temp.   ankles are only slightly tender 3+ right pedal edema, 3+ left pedal edema, and mycotic toenails.   Neurologic:  cn 2-12 grossly intact.   readily moves all 4's.    Additional Exam:  Sed Rate             [H]  66 mm/hr                    0-22    Hemoglobin A1C       [H]  7.4 %                       4.6-6.5  Sodium                    139 mEq/L                   135-145   Potassium                 4.6 mEq/L                   3.5-5.1   Chloride                  100 mEq/L                   96-112   Carbon Dioxide            31 mEq/L                    19-32   Glucose              [L]  57 mg/dL                    16-10   BUN                  [H]  27 mg/dL                    9-60   Creatinine                1.3 mg/dL                   4.5-4.0   Calcium                   9.5 mg/dL                   9.8-11.9    Uric Acid            [H]  11.3 mg/dL                  1.4-7.8  B-Type Natriuetic Peptide           58.6 pg/mL                  0.0-100.0    Impression & Recommendations:  Problem # 1:  ANKLE PAIN, BILATERAL (ICD-719.47) prob due to #2  Problem # 2:  GOUT, ANKLE (ICD-274.9) Assessment: New  Problem # 3:  edema slightly worse  Problem # 4:  DIABETES MELLITUS-TYPE II (ICD-250.00) needs slightly increased rx  Medications Added to Medication List This Visit: 1)   Furosemide 80 Mg Tabs (Furosemide) .Marland Kitchen.. 1 tab once daily 2)  Vitamin D 400 Unit Tabs (Cholecalciferol) .Marland Kitchen.. 1 by mouth once daily 3)  Multivitamins Tabs (Multiple vitamin) .Marland Kitchen.. 1 by mouth once daily 4)  Allopurinol 300 Mg Tabs (Allopurinol) .Marland Kitchen.. 1 tab once daily 5)  Colcrys 0.6 Mg Tabs (Colchicine) .Marland Kitchen.. 1 every hr as needed for gout, nte 6/day.  Other Orders: TLB-Sedimentation Rate (ESR) (85652-ESR) TLB-A1C / Hgb A1C (Glycohemoglobin) (83036-A1C) TLB-BMP (Basic Metabolic Panel-BMET) (80048-METABOL) TLB-Uric Acid, Blood (84550-URIC) TLB-BNP (B-Natriuretic Peptide) (83880-BNPR)  Patient Instructions: 1)  increase furosemide to 80 mg once daily. 2)  blood tests are being ordered for you today.  please call [phone] to hear your test results. 3)  Please schedule a follow-up appointment in 3 months. 4)  normalization of testosterone is not known to harm you.  however, there are "theoretical" risks, including increased fertility, hair loss, prostate cancer, benign prostate enlargement, lower hdl ("good cholesterol"), sleep apnea, and behavior changes. 5)  (update: i left message on phone-tree:  i sent rxs for allopruinol (start in a few days), and and colchicine, for as needed use.  when you take colchicine, skip 2 days of zocor). Prescriptions: COLCRYS 0.6 MG TABS (COLCHICINE) 1 every hr as needed for gout, nte 6/day.  #30 x 0   Entered and Authorized by:   Minus Breeding MD   Signed by:   Minus Breeding MD on 01/06/2010   Method used:   Electronically to        [facility]  [address] [phone]* (retail)       [address]       [place], [place]  [address]       Ph: [phone]       Fax: [phone]   RxID:   [id number] ALLOPURINOL 300 MG TABS (ALLOPURINOL) 1 tab once daily  #30 x 11   Entered and Authorized by:   Minus Breeding MD   Signed by:   Minus Breeding MD on 01/06/2010   Method used:   Electronically to        [facility]  [address] [phone]* (retail)       [address]        [place], [place]  [address]       Ph: [phone]       Fax: [phone]   RxID:   [id number]    Orders Added: 1)  TLB-Sedimentation Rate (ESR) [85652-ESR] 2)  TLB-A1C / Hgb A1C (Glycohemoglobin) [83036-A1C] 3)  TLB-BMP (Basic Metabolic Panel-BMET) [80048-METABOL] 4)  TLB-Uric Acid, Blood [84550-URIC] 5)  TLB-BNP (B-Natriuretic Peptide) [83880-BNPR]

## 2010-03-03 NOTE — Letter (Signed)
Summary: [facility]  [facility]   Imported By: Sherian Rein 02/03/2010 12:21:09  _____________________________________________________________________  External Attachment:    Type:   Image     Comment:   External Document

## 2010-03-03 NOTE — Progress Notes (Signed)
Summary: 90 day on meds  Phone Note From Pharmacy   Caller: [facility]  [address] [phone]* Summary of Call: Pt is is requesting 90 day supply on his humalog pen, humulin pen, BD pen needles and allopurionol Initial call taken by: Orlan Leavens RMA,  February 14, 2010 1:00 PM    New/Updated Medications: BD PEN NEEDLE ULTRAFINE 29G X 12.7MM MISC (INSULIN PEN NEEDLE) use as directed Prescriptions: BD PEN NEEDLE ULTRAFINE 29G X 12.7MM MISC (INSULIN PEN NEEDLE) use as directed  #200 x 1   Entered by:   Orlan Leavens RMA   Authorized by:   Minus Breeding MD   Signed by:   Orlan Leavens RMA on 02/14/2010   Method used:   Electronically to        [facility]  [id number]* (retail)       [address]       [place], [place]  [address]       Ph: [phone]       Fax: [phone]   RxID:   [id number] ALLOPURINOL 300 MG TABS (ALLOPURINOL) 1 tab once daily  #90 x 2   Entered by:   Orlan Leavens RMA   Authorized by:   Minus Breeding MD   Signed by:   Orlan Leavens RMA on 02/14/2010   Method used:   Electronically to        [facility]  [address] [phone]* (retail)       [address]       [place], [place]  [address]       Ph: [phone]       Fax: [phone]   RxID:   [id number] HUMALOG KWIKPEN 100 UNIT/ML SOLN (INSULIN LISPRO (HUMAN)) three times a day (qac) 45-40-85 units  #90 day x 1   Entered by:   Orlan Leavens RMA   Authorized by:   Minus Breeding MD   Signed by:   Orlan Leavens RMA on 02/14/2010   Method used:   Electronically to        [facility]  [id number]* (retail)       [address]       [place], [place]  [address]       Ph: [phone]       Fax: [phone]   RxID:   [id number] HUMULIN N PEN 100 UNIT/ML  SUSP (INSULIN ISOPHANE HUMAN) 130 units at bedtime  #90 day x 1   Entered by:   Orlan Leavens RMA   Authorized by:   Minus Breeding MD   Signed by:   Orlan Leavens RMA on 02/14/2010   Method used:   Electronically to        [facility]  [address] [phone]* (retail)       [address]  [place], [place]  [address]       Ph: [phone]       Fax: [phone]   RxID:   [id number]

## 2010-03-03 NOTE — Progress Notes (Signed)
Summary: pharmacy change  Phone Note Refill Request Message from:  Patient on February 17, 2010 4:00 PM  Refills Requested: Medication #1:  ALLOPURINOL 300 MG TABS 1 tab once daily   Dosage confirmed as above?Dosage Confirmed   Supply Requested: 6 months Pt request Rx to [facility] [address] [facility] [place]   Method Requested: Electronic Initial call taken by: Margaret Pyle, CMA,  February 17, 2010 4:00 PM    Prescriptions: ALLOPURINOL 300 MG TABS (ALLOPURINOL) 1 tab once daily  #90 x 2   Entered by:   Margaret Pyle, CMA   Authorized by:   Minus Breeding MD   Signed by:   Margaret Pyle, CMA on 02/17/2010   Method used:   Electronically to        [facility]* (retail)       [address] [place], [place]  [address]       Ph: [phone]       Fax: [phone]   RxID:   [id number]

## 2010-03-31 ENCOUNTER — Telehealth: Payer: Self-pay | Admitting: Endocrinology

## 2010-04-01 ENCOUNTER — Encounter (INDEPENDENT_AMBULATORY_CARE_PROVIDER_SITE_OTHER): Payer: Self-pay | Admitting: *Deleted

## 2010-04-01 ENCOUNTER — Other Ambulatory Visit: Payer: Self-pay | Admitting: Endocrinology

## 2010-04-01 ENCOUNTER — Other Ambulatory Visit: Payer: Medicare Other

## 2010-04-01 DIAGNOSIS — Z0000 Encounter for general adult medical examination without abnormal findings: Secondary | ICD-10-CM

## 2010-04-01 DIAGNOSIS — E119 Type 2 diabetes mellitus without complications: Secondary | ICD-10-CM

## 2010-04-01 DIAGNOSIS — E291 Testicular hypofunction: Secondary | ICD-10-CM

## 2010-04-01 LAB — HEPATIC FUNCTION PANEL
Alkaline Phosphatase: 55 U/L (ref 39–117)
Bilirubin, Direct: 0.2 mg/dL (ref 0.0–0.3)
Total Bilirubin: 0.5 mg/dL (ref 0.3–1.2)
Total Protein: 6.5 g/dL (ref 6.0–8.3)

## 2010-04-01 LAB — CBC WITH DIFFERENTIAL/PLATELET
Basophils Relative: 0.5 % (ref 0.0–3.0)
Eosinophils Absolute: 0.5 10*3/uL (ref 0.0–0.7)
Eosinophils Relative: 4.3 % (ref 0.0–5.0)
Lymphocytes Relative: 15.6 % (ref 12.0–46.0)
MCHC: 33.6 g/dL (ref 30.0–36.0)
Monocytes Absolute: 1.2 10*3/uL — ABNORMAL HIGH (ref 0.1–1.0)
Neutrophils Relative %: 69 % (ref 43.0–77.0)
Platelets: 278 10*3/uL (ref 150.0–400.0)
RBC: 4.91 Mil/uL (ref 4.22–5.81)
WBC: 11.6 10*3/uL — ABNORMAL HIGH (ref 4.5–10.5)

## 2010-04-01 LAB — BASIC METABOLIC PANEL
Calcium: 9 mg/dL (ref 8.4–10.5)
Creatinine, Ser: 1 mg/dL (ref 0.4–1.5)
GFR: 77.97 mL/min (ref >60.00)
Glucose, Bld: 129 mg/dL — ABNORMAL HIGH (ref 70–99)
Sodium: 140 mEq/L (ref 135–145)

## 2010-04-01 LAB — LIPID PANEL
Cholesterol: 121 mg/dL (ref 0–200)
HDL: 31.2 mg/dL — ABNORMAL LOW (ref >39.00)
LDL Cholesterol: 58 mg/dL (ref 0–99)
Total CHOL/HDL Ratio: 4
Triglycerides: 161 mg/dL — ABNORMAL HIGH (ref 0.0–149.0)
VLDL: 32.2 mg/dL (ref 0.0–40.0)

## 2010-04-01 LAB — URINALYSIS
Bilirubin Urine: NEGATIVE
Hgb urine dipstick: NEGATIVE
Ketones, ur: NEGATIVE
Total Protein, Urine: NEGATIVE
Urine Glucose: NEGATIVE
Urobilinogen, UA: 0.2 (ref 0.0–1.0)

## 2010-04-01 LAB — HEMOGLOBIN A1C: Hgb A1c MFr Bld: 8.3 % — ABNORMAL HIGH (ref 4.6–6.5)

## 2010-04-04 ENCOUNTER — Ambulatory Visit (INDEPENDENT_AMBULATORY_CARE_PROVIDER_SITE_OTHER): Payer: Medicare Other | Admitting: Endocrinology

## 2010-04-04 ENCOUNTER — Encounter: Payer: Self-pay | Admitting: Endocrinology

## 2010-04-04 DIAGNOSIS — E1065 Type 1 diabetes mellitus with hyperglycemia: Secondary | ICD-10-CM

## 2010-04-07 NOTE — Progress Notes (Signed)
Summary: Labs prior?  Phone Note Call from Patient Call back at Home Phone [phone]   Caller: Patient Summary of Call: Pt called stating he has aapt scheduled 03/05 and he would like to know if MD wanted him to have A1C done prior to appt, please advise. Initial call taken by: Margaret Pyle, CMA,  March 31, 2010 9:09 AM  Follow-up for Phone Call        testosterone 257.2  a1c 250.01   cpx labs v70.0 Follow-up by: Minus Breeding MD,  March 31, 2010 9:15 AM  Additional Follow-up for Phone Call Additional follow up Details #1::        Pt advised and is requesting labs be scheduled 04/01/2010. Can you please schedule? Thanks Additional Follow-up by: Margaret Pyle, CMA,  March 31, 2010 9:34 AM    Additional Follow-up for Phone Call Additional follow up Details #2::    labs scheduled. Follow-up by: Daphane Shepherd,  March 31, 2010 4:21 PM

## 2010-04-19 NOTE — Assessment & Plan Note (Signed)
Summary: 3 MO FU #CD   Vital Signs:  Patient profile:   70 year old male Height:      75 inches (190.50 cm) Weight:      383.13 pounds (174.15 kg) BMI:     48.06 O2 Sat:      92 % on Room air Temp:     97.7 degrees F (36.50 degrees C) oral Pulse rate:   77 / minute BP sitting:   158 / 62  (left arm) Cuff size:   large  Vitals Entered By: Brenton Grills CMA Duncan Dull) (April 04, 2010 9:56 AM)  O2 Flow:  Room air CC: 3 month F/U/pt is no longer taking Vitamin D/questions about Furosemide/aj   Primary Provider:  Lorna Few  CC:  3 month F/U/pt is no longer taking Vitamin D/questions about Furosemide/aj.  History of Present Illness: pt states 3 years of severe edema of the legs, and intermittent assoc ulcers.   pt says he has not been checking cbg's.  he ran out of insulin while on a cruise recently.    Current Medications (verified): 1)  Adult Aspirin Low Strength 81 Mg  Tbdp (Aspirin) .... Take 1 Tablet By Mouth Once Daily 2)  Pen Needle Bd .Marland Kitchen.. 25g X 1/2 In 3)  Bd Insulin Syringe 30g X 1/2" 0.5 Ml  Misc (Insulin Syringe-Needle U-100) .... Use As Directed 4)  Humulin N Pen 100 Unit/ml  Susp (Insulin Isophane Human) .Marland KitchenMarland KitchenMarland Kitchen 130 Units At Bedtime 5)  Humalog Kwikpen 100 Unit/ml Soln (Insulin Lispro (Human)) .... Three Times A Day (Qac) 45-45-85 Units 6)  Diabetic Shoes and Socks .Marland Kitchen.. 782.3 and 250.01 7)  Testim 50 Mg/5gm Gel (Testosterone) .... 2x5 Grams Once Daily 8)  Simvastatin 80 Mg Tabs (Simvastatin) .... 1/2 Tab Once Daily 9)  Furosemide 80 Mg Tabs (Furosemide) .Marland Kitchen.. 1 Tab Once Daily 10)  Vitamin D 400 Unit Tabs (Cholecalciferol) .Marland Kitchen.. 1 By Mouth Once Daily 11)  Multivitamins  Tabs (Multiple Vitamin) .Marland Kitchen.. 1 By Mouth Once Daily 12)  Allopurinol 300 Mg Tabs (Allopurinol) .Marland Kitchen.. 1 Tab Once Daily 13)  Colcrys 0.6 Mg Tabs (Colchicine) .Marland Kitchen.. 1 Every Hr As Needed For Gout, Nte 6/day. 14)  Bd Pen Needle Ultrafine 29g X 12.71mm Misc (Insulin Pen Needle) .... Use As Directed  Allergies  (verified): 1)  Lamisil (Terbinafine Hcl)  Past History:  Past Medical History: Last updated: 11/19/2009 Hyperlipidemia Osteoarthritis Low Back Pain Mutinodular Goiter DM Peripheral Sleep Apnea NASH DM Retinopathy E.D. Hx of colitis in 1997, nonspecific UNSPECIFIED ANEMIA (ICD-285.9) OSTEOARTHRITIS (ICD-715.90) HYPERLIPIDEMIA (ICD-272.4) DIABETES MELLITUS, TYPE I (ICD-250.01)  cardiol: klein pulm: clance derm: gould  Review of Systems  The patient denies hypoglycemia.         no change in doe after 75 feet.  Physical Exam  General:  morbidly obese.  no distress  Pulses:  dorsalis pedis intact bilat, but decreased from normal (prob due to edema) Extremities:  no deformity.  no open ulcer on the feet, but there are healed ulcers at both legs.  feet are otherwise of normal color and temp.   ankles are only slightly tender 3+ right pedal edema, 3+ left pedal edema, and mycotic toenails.   Neurologic:  sensation is intact to touch on the feet   Impression & Recommendations:  Problem # 1:  HYPOGONADISM (ICD-257.2) Assessment [place] needs increased rx  Problem # 2:  DIABETES MELLITUS-TYPE II (ICD-250.00) HgbA1C: 8.3 (04/01/2010)  Problem # 3:  edema persistent  Medications Added to Medication  List This Visit: 1)  Adult Aspirin Low Strength 81 Mg Tbdp (Aspirin) .... Take 1 tablet by mouth once daily 2)  Humalog Kwikpen 100 Unit/ml Soln (Insulin lispro (human)) .... Three times a day (qac) 45-45-85 units 3)  Testim 50 Mg/5gm Gel (Testosterone) .... 3x5 grams once daily 4)  Compression Stockings  .... To knees 782.3  Other Orders: Est. Patient Level IV ([address])  Patient Instructions: 1)  Please schedule a follow-up appointment in 3 months, with blood tests prior 2)  normalization of testosterone is not known to harm you.  however, there are "theoretical" risks, including increased fertility, hair loss, prostate cancer, benign prostate enlargement, lower  hdl ("good cholesterol"), sleep apnea, and behavior changes. 3)  increase testim to 3x5 grams once daily. 4)  same insulin for now.  5)  keep compression stockings on your legs.  here is a prescription. 6)  check your blood sugar 2 times a day.  vary the time of day when you check, between before the 3 meals, and at bedtime.  also check if you have symptoms of your blood sugar being too high or too low.  please keep a record of the readings and bring it to your next appointment here.  please call us sooner if you are having low blood sugar episodes. Prescriptions: COMPRESSION STOCKINGS to knees 782.3  #1 pair x 0   Entered and Authorized by:   Minus Breeding MD   Signed by:   Minus Breeding MD on 04/04/2010   Method used:   Print then Give to Patient   RxID:   [id number] TESTIM 50 MG/5GM GEL (TESTOSTERONE) 3x5 grams once daily  #90 x 5   Entered and Authorized by:   Minus Breeding MD   Signed by:   Minus Breeding MD on 04/04/2010   Method used:   Print then Give to Patient   RxID:   [id number]    Orders Added: 1)  Est. Patient Level IV [[address]]

## 2010-05-06 LAB — GLUCOSE, CAPILLARY: Glucose-Capillary: 174 mg/dL — ABNORMAL HIGH (ref 70–99)

## 2010-06-14 NOTE — Op Note (Signed)
[name], [name]               ACCOUNT NO.:  [id number]   MEDICAL RECORD NO.:  [id number]          PATIENT TYPE:  INP   LOCATION:  2302                         FACILITY:  [facility]   PHYSICIAN:  Salvatore Decent. Dorris Fetch, M.D.DATE OF BIRTH:  1940/03/24   DATE OF PROCEDURE:  09/17/2006  DATE OF DISCHARGE:                               OPERATIVE REPORT   PREOPERATIVE DIAGNOSIS:  Single-vessel coronary disease with exertional  angina.   POSTOPERATIVE DIAGNOSIS:  Single-vessel coronary disease with exertional  angina.   PROCEDURE:  1. Median sternotomy.  2. Off-pump coronary bypass grafting x1 with left internal mammary      artery to the left anterior descending.   SURGEON:  Salvatore Decent. Dorris Fetch, M.D.   ASSISTANTSalvatore Decent. Cornelius Moras, M.D.   ANESTHESIA:  General.   FINDINGS:  Morbid obesity, good-quality conduit, good-quality target,  left ventricular hypertrophy.   CLINICAL NOTE:  [name] is a 70 year old gentleman with multiple  cardiac risk factors who presents with exertional anginal symptoms.  At  catheterization he had a chronically totally occluded LAD; and no  significant disease in his left circumflex or right coronary  distributions.  An attempt at angioplasty was unsuccessful as a wire would not cross the  chronically occluded vessel.  The patient was referred for coronary  artery bypass grafting.  The indications, risks, benefits, and  alternatives were discussed in detail with the patient.  He understood  and accepted the risks and agreed to proceed.  We did discuss possibly  doing off-pump grafting if technically possible; and he understood the  potential advantages of that approach.   CLINICAL NOTE:  [name] was brought to the preop holding area on  09/17/2006.  There, the Anesthesia Service placed lines for monitoring  arterial central venous and pulmonary arterial pressures.  Intravenous  antibiotics were administered.  He was taken to the operating  room,  anesthetized, and intubated.  A Foley catheter was placed; and the  chest, abdomen, and legs were prepped and draped in the usual fashion.   A median sternotomy was performed and the left internal mammary artery  was harvested using standard technique.  This was difficult due to the  patient's morbid obesity; but the mammary was a good-quality vessel.  The off-pump dose of heparin 25,000 units was administered prior to  dividing the distal end of the left mammary artery.  There was excellent  flow through the left mammary which was a 2 mm good-quality conduit.   The pericardium was opened.  The LAD was inspected.  It was epicardial  at the site chosen for the anastomosis; and there was adequate length on  the mammary artery; and the decision was made to proceed with off-pump  grafting.  The Guidant expose system was used.  The retractor was  placed.  The patient was placed in Trendelenburg position.  Deep  pericardial stay sutures were placed. Because of the marked hypertrophy  of the heart, a suction device was placed on the apex of the heart; and  the stabilizer was placed at the site chosen for anastomosis on the  distal LAD.  The left mammary artery was brought through an incision in  the pericardium; and the distal end was beveled.  Silastic tapes were  placed proximally and distally to the site chosen for the anastomosis;  and an arteriotomy was made.  Only light traction was required to  achieve good hemostasis.  There was excellent stabilization.  The  patient tolerated this well hemodynamically; and without significant  arrhythmia or ST-segment changes.  The mammary to the LAD anastomosis  then was performed with a running 8-0 Prolene suture, in an end-to-side  fashion.  At the completion of the anastomosis the bulldog clamp was  removed to inspect for hemostasis.  A single 8-0 Prolene figure-of-eight  suture was required for a small anastomotic leak.  The Silastic  tapes  were released the mammary pedicle was tacked to the epicardial surface  of the heart with 6-0 Prolene sutures.  The stabilizing devices were  released; and the heart was allowed to rest back in the normal location  of the pericardium.  The deep pericardial stay sutures were removed.  Again, the patient tolerated this well hemodynamically and without  significant arrhythmias.   Test dose of protamine was administered; and was well tolerated.  Epicardial pacing wires were placed on the right ventricle and right  atrium.  The patient was in sinus rhythm, but was atrially paced for  rate.  The remainder of the protamine was administered without incident.  Hemostasis was achieved.  The chest was irrigated with 1 liter of normal  saline containing 1 gram of vancomycin.  A 32-French Blake drain was  placed in the anterior mediastinum.  The pericardium could not be  reapproximated.  The sternum was closed with a combination of single-and-  double heavy gauge stainless steel wires.  The pectoralis fascia,  subcutaneous tissue, and skin were closed in standard fashion.  All  sponge, needle and sponge counts were correct at the end of procedure.  There no intraoperative complications; and the patient was taken from  the operating room to the surgical intensive care unit in good  condition.      Salvatore Decent Dorris Fetch, M.D.  Electronically Signed     SCH/MEDQ  D:  09/17/2006  T:  09/17/2006  Job:  [id number]   cc:   Duke Salvia, MD, [facility]  Sean A. Everardo All, MD

## 2010-06-14 NOTE — Letter (Signed)
Jun 29, 2006    [name] All, MD  [address]  [place], [place] [address]   RE:  [name]  MRN:  [id number]  /  DOB:  [birth year]   Dear [name]:   It was a pleasure to see [name] today in consultation regarding  his progressive exercise intolerance.  As you know, he is a morbidly  obese 355-pound gentleman who works for [employer], who has had progressive  exercise intolerance over the last number of months and each increment  of a couple of months he has noted a significant change.  This is  unaccompanied by chest discomfort or any discomfort between his neck and  his navel.  It is accompanied, however, by a sense that his legs are  very, very heavy.   While he thinks his weight has gone up 30 pounds in 3 months, and 100  pounds in two years looking back over his chart, his weight is actually  within 10 pounds of weights of most of last year, dating back to January  2007.   His cardiac risk factors are notable for hypertension, diabetes, and he  is also treated for dyslipidemia with a very low HDL.   His past related history is notable for an echo three years ago  demonstrating an ejection fraction of 55-65%.   He also had a complex procedure for a parapneumonic effusion requiring  some peeling a couple of years ago.   PAST MEDICAL HISTORY:  In addition to the above is notable for question  of Graves disease which is currently not an issue, although I should  mention that is TSH has gone from 1.8 or so to 2.4 between 2006 and  2007.   REVIEW OF SYSTEMS:  Otherwise broadly negative.   PAST SURGICAL HISTORY:  1. Appendectomy.  2. Gallbladder surgery.  3. Lung surgery as noted previously.   SOCIAL HISTORY:  He works as noted.  He has two children, two  grandchildren.  He does not use cigarettes, alcohol, or recreational  drugs.  He last smoked about six years ago.   MEDICATIONS:  1. Humalog insulin.  2. Aspirin 81.  3. Lisinopril 40.  4. Furosemide 40.  5. Lantus insulin.  6. Simvastatin 40.   ALLERGIES:  PENICILLIN.   PHYSICAL EXAMINATION:  VITAL Mcintyre:  He is 355 pounds.  His blood  pressure is 119/69, his pulse is 70.  HEENT:  Demonstrated no icterus or xanthomata.  NECK:  Veins were flat.  Carotids are brisk and full bilaterally without  bruits.  BACK:  Without kyphosis, scoliosis.  LUNGS:  Clear.  HEART:  Sounds were regular without murmurs, rubs, or gallops.  ABDOMEN:  Protuberant but soft.  Bowel sounds were active.  Femoral  pulses were one plus.  EXTREMITIES:  Distal pulses were one plus.  There was no clubbing,  cyanosis.  There was two plus edema.  NEUROLOGIC:  Grossly normal.  SKIN:  Warm and dry.   Electrocardiogram:  I have misplaced the previous electrocardiogram,  from 2007 was normal.   IMPRESSION:  1. Progressive exercise intolerance, manifested by dyspnea on exertion      and heavy legs.  2. Cardiac risk factors notable for hypertension, diabetes,      dyslipidemia, and the metabolic syndrome.  3. Obstructive sleep apnea, followed by Dr. Shelle Iron.  4. Multinodular goiter with a TSH that had increased some last visit.   [name], [name] has a high pretest probability of having coronary  artery disease.  His exercise intolerance is certainly concerning for an  anginal equivalent.  Based on his pre-test probability, I suggested to  him that he undergo catheterization.  He relates to me the unfortunate  story of his wife, where she underwent previous PCI that was well  tolerated and then she had a complicated PCI for which they actually  thought she had some kind of damage to one of her arteries and she went  for emergent surgery.  Because of that, he is reluctant to undergo  catheterization.   I have reviewed with him an alternative algorithm which would be  undertake Myoview scanning.  Realizing that the negative predictive  value is not all that sufficient in a guy with his pretest probability.  We had  talked about the possible role of CT angiography.  We have to see  whether his weight would preclude this.  But if his CTA were negative  without any evidence of coronary calcification, we could probably stop  there.  In the event that those were abnormal, I would again encourage  him to pursue catheterization.  He and I will have to discuss it at that  time.   Following aforementioned evaluation, though I think he would also  benefit potentially from cardiopulmonary stress testing to try to sort  out what is the major contribution to his dyspnea as this is really very  limiting, being unable to walk even as much as 100 feet.   [name], thank you very much for the consultation.    Sincerely,      Michael Salvia, MD, [facility]  Electronically Signed    SCK/MedQ  DD: 06/29/2006  DT: 06/29/2006  Job #: [id number]

## 2010-06-14 NOTE — Discharge Summary (Signed)
[name], [name]               ACCOUNT NO.:  [id number]   MEDICAL RECORD NO.:  [id number]          PATIENT TYPE:  OIB   LOCATION:  6525                         FACILITY:  [facility]   PHYSICIAN:  Doylene Canning. Ladona Ridgel, MD    DATE OF BIRTH:  July 25, 1940   DATE OF ADMISSION:  09/07/2006  DATE OF DISCHARGE:  09/08/2006                         DISCHARGE SUMMARY - REFERRING   DISCHARGE DIAGNOSES:  1. Unstable angina.  2. Coronary artery disease.  3. Failed percutaneous coronary intervention of a totally occluded      left anterior descending lesion.  History as mentioned below.   PROCEDURE:  Cardiac catheterization on September 06, 2006, by Dr. Juanda Chance and  failed percutaneous coronary intervention to the left anterior  descending on September 07, 2006, by Dr. Juanda Chance.   HOSPITAL COURSE:  [name] is a 70 year old male who has been  evaluated extensively within the last several weeks within our office  for dyspnea on exertion, progressive exercise tolerance, intermittent  chest discomfort that may occur at any time.  His history is notable for  hypertension, dyslipidemia, diabetes.  A Myoview on July 13, 2006,  showed an EF of 57%, mild anterior apical ischemia.  He was thus  referred for cardiac catheterization.  His medical history is also  notable for OSA, right VATS, drainage of empyema on February 24, 2003,  cholecystectomy, appendectomy, iron deficiency anemia.   Cardiac catheterization on August 7, showed a 90%, total proximal LAD  with EF of 60%.  Given his LAD lesion, he was brought to the holding  area in the catheterization lab.  Dr. Juanda Chance, during a long procedure,  attempted to cross the LAD lesion, however, was unable to do so, thus  CVTS, Dr. Dorris Fetch was consulted.  Dr. Dorris Fetch noted that the  patient did receive Plavix on September 06, 2006.  He needed to wait 5-7  days prior to bypass surgery with a LIMA to the LAD given the bleeding  risk.  Dr. Dorris Fetch felt that the patient  could be discharged home to  return as an outpatient.  On September 08, 2006, the patient was ambulating  without difficulty.  Catheterization site was intact.  Dr. Ladona Ridgel felt  that the patient could be discharged home prior to discharge.  Preliminary bypass Dopplers were performed and did not show any ICA  stenoses, antegrade vertebral flow, normal ABIs with pedal wave forms.   DISCHARGE LABORATORY DATA AND X-RAY FINDINGS:  On August 9, H&H was 12.2  and 35.6, normal indices, platelets 306, WBC 7.1.  Sodium 139, potassium  4, BUN 19, creatinine 1, glucose 151.  Post procedure CK-MB was 88 and  1.8.   EKGs showed normal sinus rhythm, left axis deviation, early R-wave.   DISPOSITION:  The patient is discharged home.   DIET:  He is asked to maintain low-sodium, heart-healthy, ADA diet.   WOUND CARE/ACTIVITY:  Were described per supplemental discharge sheet.   DISCHARGE MEDICATIONS:  1. Lisinopril 40 mg daily.  2. Lasix 40 mg daily.  3. Aspirin 325 daily.  4. Zocor 40 mg nightly.  5. Lantus and Humalog as  previously.  6. Allopurinol 300 mg daily.  7. Nitroglycerin 0.4 as needed.   SPECIAL INSTRUCTIONS:  Dr. Dorris Fetch will be contacting [name] as  to when to return for his bypass surgery.  He was asked to report to the  emergency room for any chest discomfort unrelieved with nitroglycerin.  He is asked to bring all medications to all appointments.   Discharge time 25 minutes.      Joellyn Rued, PA-C      Doylene Canning. Ladona Ridgel, MD  Electronically Signed    EW/MEDQ  D:  09/08/2006  T:  09/08/2006  Job:  [id number]   cc:   [name] A. Everardo All, MD  Barbaraann Share, MD,FCCP  Duke Salvia, MD, [facility]

## 2010-06-14 NOTE — Discharge Summary (Signed)
[name], ABDELAZIZ               ACCOUNT NO.:  [id number]   MEDICAL RECORD NO.:  [id number]          PATIENT TYPE:  INP   LOCATION:  5511                         FACILITY:  [facility]   PHYSICIAN:  Stacie Glaze, MD    DATE OF BIRTH:  09/24/1940   DATE OF ADMISSION:  01/23/2007  DATE OF DISCHARGE:  01/27/2007                               DISCHARGE SUMMARY   ADMITTING DIAGNOSES:  1. Urosepsis.  2. Acute prostatitis with obstructive symptoms.  3. Diabetes.  4. Hypertension.   DISCHARGE DIAGNOSES:  1. Acute prostatitis, Escherichia coli .  2. Urinary tract infection, Escherichia coli.  3. Obstructive uropathy, resolved.  4. Insulin-requiring diabetes.  5. Obesity.  6. Hypertension.   HOSPITAL COURSE:  The patient is a 70 year old white male admitted to  the hospital from his primary care physician's office after he presented  with inability to urinate.  He was admitted to the general medicine  service and was diagnosed with E. coli prostatitis and a urinary tract  infection.  An ultrasound of his abdomen revealed no hydronephrosis or  upper tract pathology.  He was placed on IV antibiotics.  A Foley catheter was placed because of  his obstructive uropathy after 48 hours, the Foley was removed and he  was able to void without difficulty.  He was converted to p.o.  antibiotics, ambulated in the hall and demonstrated that he had no  recurrence of his obstructive uropathy.  He is discharged to home in  improved condition.   DISCHARGE MEDICATIONS:  1. Ciprofloxacin 500 mg twice daily for 14 days.  2. He will continue all of his home medications as before including      his Humalog as indicated by his primary care physician.  3. Lantus at 60 units at bedtime.  4. Simvastatin 40 mg at bedtime.  5. Lasix 40 mg once a day.  6. Metoprolol succinate 25 mg one p.o. daily.  7. Lisinopril 20 mg one p.o. daily.   He will follow up with Dr. Romero Belling within one week of  discharge.      Stacie Glaze, MD  Electronically Signed     JEJ/MEDQ  D:  01/27/2007  T:  01/27/2007  Job:  [id number]   cc:   [name] A. Everardo All, MD

## 2010-06-14 NOTE — Assessment & Plan Note (Signed)
[facility]                         ELECTROPHYSIOLOGY OFFICE NOTE   NAME:[name], [name]                      MRN:          [id number]  DATE:10/10/2006                            DOB:          1940/05/22    [name] comes in following bypass.  He is doing quite well.  His  weight is up significantly.  His appetite has come back and he is  walking around some.  He is planning on going to cardiac rehab.  I  should note that his weight, while up postoperatively, is still down ten  pounds from when we first saw him in May.   His blood pressure today is 112/66, his pulse is 72.  His lungs were clear.  Heart sounds were regular.  Extremities had 2+ edema.  There is an area of sort of yellowish healing on the lower portion of  his sternum.   IMPRESSION:  1. Coronary artery disease of a totaled LAD, status post LIMA.  2. Diabetes.  3. Obstructive sleep apnea.  4. Peripheral neuropathy.  5. Peripheral edema.   [name] is doing quite well.  I have asked him to decrease his Lasix  from 40 to 80 mg on days that he is home with a target weight of about  ten pounds less than what he is taking now.   We will plan to see him again in about three months' time.   He will follow up with Dr. Everardo All, who will be managing his lipids.     Duke Salvia, MD, [facility]  Electronically Signed    SCK/MedQ  DD: 10/10/2006  DT: 10/10/2006  Job #: [id number]   cc:   Salvatore Decent. Dorris Fetch, M.D.

## 2010-06-14 NOTE — Discharge Summary (Signed)
[name], [name]               ACCOUNT NO.:  [id number]   MEDICAL RECORD NO.:  [id number]          PATIENT TYPE:  INP   LOCATION:  2006                         FACILITY:  [facility]   PHYSICIAN:  Rowe Clack, P.A.-C. DATE OF BIRTH:  [birth year]   DATE OF ADMISSION:  09/17/2006  DATE OF DISCHARGE:                               DISCHARGE SUMMARY   HISTORY OF PRESENT ILLNESS:  The patient is a 70 year old gentleman with  multiple cardiac risk factors including morbid obesity insulin-dependent  diabetes mellitus, obstructive sleep apnea, dyslipidemia and  hypertension.  He, over the past 6-7 months, has noted progressive  worsening of exercise tolerance.  He has gotten to the point where he  gets short of breath with minimal activity.  He also has had some  intermittent chest discomfort which was nonspecific and not particularly  related to activity.  He saw Dr. Shelle Iron at [facility]  for evaluation and there was no underlying pulmonary cause for his  dyspnea and he was referred to Dr. Graciela Husbands.  A Myoview study showed an  ejection fraction of 57% with some mild anterior apical ischemia.  On  the day prior to admission he underwent cardiac catheterization and was  found to have a completely occluded left anterior descending coronary  artery.  His right coronary and circumflex coronary was without  significant disease.  On the date of admission he was admitted for  attempted PCI of this lesion, but they were unable to cross the wire.  He was admitted for further evaluation and treatment including cardiac  surgical consultation.   PAST MEDICAL HISTORY:  Past medical history significant for  1. Type 2 diabetes mellitus.  2. Hypertension.  3. Dyslipidemia.  4. Obstructive sleep apnea.  5. Morbid obesity.  6. History of right pneumonia with parapneumonic effusion requiring      vas and decortication, done by Dr. Edwyna Shell.  7. Peripheral neuropathy.  8. Cholecystectomy.  9.  Appendectomy.   MEDICATIONS:  Medications on admission included  1. Lisinopril 40 mg daily.  2. Enteric-coated aspirin 81 mg daily.  3. Lasix 40 mg daily.  4,  Zocor 40 mg q.h.s.  1. Lantus insulin 80 units q.h.s.  2. Humalog insulin 30 units subcu q.a.m., 30 units subcu at lunch and      50 units subcu at supper.   ALLERGIES:  ALLERGIES INCLUDE PENICILLIN.   FAMILY HISTORY, SOCIAL HISTORY, REVIEW OF SYSTEMS AND PHYSICAL EXAM:  Please see the history and physical done at the time of admission.   HOSPITAL COURSE:  The patient was admitted and consultation was obtained  with Charlett Lango, MD, who evaluated the patient and studies and  agreed to proceed with surgical revascularization procedure on September 17, 2006.  He was taken the Operating Room and underwent the planned  procedure.   1. Off-pump coronary bypass grafting times one with left internal      mammary artery to the left anterior descending.  The procedure was      performed by Charlett Lango, MD, tolerated well and he was  taken to the Surgical Intensive Care Unit in stable condition.      Postoperative Hospital Course:  The patient has done well.  He was      extubated uneventfully.  He remained hemodynamically stable without      significant cardiac dysrhythmias with the exception of early on he      did require some AAI pacing for bradycardia but this has resolved.      He now maintains normal sinus rhythm.  Inotropic agents were      discontinued without difficulty.  His capillary blood glucoses have      been under adequate control, initially with the Glucomander      protocol and subsequent return to his preoperative regimen as his      appetite slowly improves.  His laboratory values do reveal a      moderate postoperative anemia and this has stabilized.  His most      recent hemoglobin and hematocrit dated September 19, 2006, are 11 and      33, respectively.  Electrolytes, BUN and creatinine are  within      normal limits.  He has required aggressive pulmonary toilet but      shown steady improvement.  He is requiring significant assistance      with rehabilitation due to his size and comorbidities.  Tentatively      it is felt that he will require short term nursing home placement,      hopefully and the setting of rehab, and this will be arranged prior      to discharge.  Upon some further rehabilitation it is assumed that      he can return to his home when his wife and family have the ability      to manage.  He has been seen by Physical Therapy and they have made      recommendations including assistance devices for activities of      daily living.  He is motivated to this process.  His incisions are      healing well without evidence of infection.  He is tentatively felt      to be stable for discharge from a medical viewpoint at any time      when the arrangements are finalized.   MEDICATIONS ON DISCHARGE:  Medications on discharge will be as follows:  1. Allopurinol 300 mg daily.  2. Aspirin 325 mg daily.  3. Colace and Dulcolax daily as needed.  4. Lasix 40 mg daily for additional week.  5. Potassium chloride 20 mEq daily for an additional week.  6. Toprol XL 25 mg daily.  7. Lantus insulin 80 units at bedtime.  8. NovoLog insulin 30 units t.i.d. a.c.  9. Lisinopril 40 mg daily.  10.Protonix 40 mg daily.  11.Zocor 40 mg daily.  12.Ultram 50 mg one or two q.6 hours as needed or oxycodone 5 mg one      or two q.6 hours as needed.   WOUND CARE:  The patient may shower with assistance, cleaning his  incisions daily with soap and water and patted dry.   ACTIVITIES:  He is encouraged to ambulate as much as feasible with  assistance.  If Physical Therapy and Occupational Therapy are available  this with be beneficial for general strengthening and ambulation.  Additionally, he has had some difficulty with a right-handed ulnar  neuropathy that Occupational Therapy would  assist with if available.   CONDITION ON DISCHARGE:  Stable  and improving.   FINAL DIAGNOSES:  1. Severe single-vessel coronary artery disease, now status post      revascularization as described.   OTHER DIAGNOSES:  1. Morbid obesity.  2. Insulin dependent diabetes.  3. Obstructive sleep apnea.  4. Dyslipidemia.  5. Hypertension.  6. Postoperative anemia.  7. Postoperative ulnar neuropathy versus brachial plexus stretch      injury that is improving.  8. History of peripheral neuropathy.  9. History of cholecystectomy.  10.History of appendectomy.  11.History of vas decortication for para pneumonic right-sided lung      effusion secondary pneumonia.   FOLLOW UP:  An appointment should be arranged for the patient to see Dr.  Graciela Husbands in two weeks in his office.  The phone number is [phone] to make  these arrangements.  Additionally, the patient should follow up with Dr.  Charlett Lango in three weeks.  I will have the office call with  this appointment and he should obtain a chest x-ray from [facility] at [facility], which is the same building as the  [facility] office.  This should be one hour prior to the appointment to see  Dr. Dorris Fetch.      Rowe Clack, P.A.-C.     Sherryll Burger  D:  09/21/2006  T:  09/22/2006  Job:  [id number]   cc:   Salvatore Decent. Dorris Fetch, M.D.  Sean A. Everardo All, MD  Duke Salvia, MD, [facility]

## 2010-06-14 NOTE — Cardiovascular Report (Signed)
[name], [name]               ACCOUNT NO.:  [id number]   MEDICAL RECORD NO.:  [id number]          PATIENT TYPE:  OIB   LOCATION:  1961                         FACILITY:  [facility]   PHYSICIAN:  Michael R. Juanda Chance, MD, FACCDATE OF BIRTH:  1940/02/14   DATE OF PROCEDURE:  09/06/2006  DATE OF DISCHARGE:                            CARDIAC CATHETERIZATION   CLINICAL HISTORY:  [name] is 70 years old and has had progressive  exercise intolerance and chest discomfort with exertion over the last  number of weeks.  He had a Myoview scan which showed anteroapical  ischemia.  He was seen in consultation by Dr. Graciela Husbands who arranged for him  to have the Myoview scan.  His scan was abnormal and scheduled for  catheterization.  He also has insulin-dependent diabetes, hypertension,  hyperlipidemia and obstructive sleep apnea.   PROCEDURE:  The procedure was performed via the right femoral artery and  arterial sheath and 5-French preformed coronary catheters.  A front wall  arterial puncture was performed and Omnipaque contrast was used. Right  heart catheterization was performed percutaneously via right femoral  vein using sheath and Swan-Ganz thermodilution catheter.  The patient  tolerated procedure well and left the laboratory in satisfactory  condition.   RESULTS:  Left main coronary:  Left main coronary artery is free of  significant disease.   Left anterior descending artery:  Left ascending artery gave rise to a  small diagonal branch, a small septal perforator and then had a 50-90%  stenosis and was completely occluded.  The distal LAD filled via  collaterals from the right coronary artery and there was a very short  segment between the proximal occlusion and the retrograde filling from  the collaterals.   Circumflex artery:  Circumflex artery gave rise to a marginal branch and  two posterolateral branches.  These vessels were free of significant  disease.   Right coronary artery:  The  right coronary artery was a moderate-sized  vessel that gave rise to a conus branch, a right ventricle branch, a  posterior branch and a small posterolateral branch.  This vessel was  irregular with no major obstruction.   Left ventriculogram:  The left ventriculogram from the RAO projection  showed good wall motion with no areas of hypokinesis.  The estimated  ejection fraction was 60%.   HEMODYNAMIC DATA:  Pulmonary pressure was 47/17 with a mean of 32.  Pulmonary wedge pressure was 13.  Left ventricular pressure was 134/19.  Aortic pressure was 134/67 with a mean of 89.  Cardiac output/cardiac  index was 7.0/2.5 years.  These were described by Fick.   CONCLUSIONS:  1. Coronary artery disease with total occlusion of the proximal to mid      left anterior descending artery, no major obstruction of circumflex      or right coronaries and normal LV function.  2. Moderate elevation of pulmonary artery pressure associated with      obesity and obstructive sleep apnea.   RECOMMENDATIONS:  The patient had a chronic total occlusion of the right  coronary artery and documented ischemia on Myoview  scan and I think this  is asymptomatic from this.  For a chronic total lesion, the lesion  appears reasonably favorable for percutaneous intervention and will  schedule that for tomorrow or next week.      [name] Juanda Chance, MD, [facility]  Electronically Signed     BRB/MEDQ  D:  09/06/2006  T:  09/06/2006  Job:  [id number]   cc:   [name] A. Everardo All, MD  Duke Salvia, MD, [facility]  Cardiopulmonary Lab

## 2010-06-14 NOTE — Letter (Signed)
February 12, 2008    Sean A. Everardo All, M.D.  [address]  [place], [place] [address]   RE:  [name], [name]  MRN:  [id number]  /  DOB:  10-25-1940   Dear [name]:   [name] comes in today in follow-up for coronary artery disease with  a totally occluded LAD in the setting of normal left ventricular  function.  It was unopenable despite Dr. Regino Schultze best attempts.   He continues to have problems with exercise intolerance and progressive  weight gain.  He has significant peripheral edema.  He uses furosemide  40 mg a day, having been up titrated from 20.  He had been on 80 b.i.d.  a year or so ago.  His other medications include lisinopril,  simvastatin, and insulin.   On examination, his blood pressure is 140/60.  His pulse was 82.  His  weight was 369.  NECK:  Veins were 7.  LUNGS:  Were clear.  HEART:  Sounds were irregular.  EXTREMITIES:  Had 2 to 3 plus edema with some superficial erythema.   IMPRESSION:  1. Coronary artery disease with an occluded LAD with normal left      ventricular function.  2. Morbid obesity with consideration for gastric bypass surgery.  3. Progressive peripheral edema and weight gain.  4. Bradycardia with previous evaluation demonstrating no chronotropic      incompetence.   [name], [name] is doing well from his ischemic cardiac point-of-view.  I am concerned about his edema and suspect he probably has 20 pounds of  water in his largely edematous legs.  I have asked him to double his  Lasix from 40 mg once a day to 40 twice a day.  I have asked him to go  on potassium 20 mEq a day.  He is to see you in about 2 weeks' time and  would ask that you would check his BMET at that time, and I will defer  you further management of his edema.   We will plan to see him again in 6 months' time.    Sincerely,      Michael Salvia, MD, [facility]  Electronically Signed    SCK/MedQ  DD: 02/12/2008  DT: 02/12/2008  Job #: [id number]

## 2010-06-14 NOTE — Assessment & Plan Note (Signed)
[facility]                         ELECTROPHYSIOLOGY OFFICE NOTE   NAME:Murley, ROEL DOUTHAT                      MRN:          [id number]  DATE:08/07/2007                            DOB:          Mar 12, 1940    Mr. Haak was seen in follow up for ischemic heart disease with a  totally occluded LAD that Dr. Edwyna Shell was unable to open, he had normal  left ventricular function.   He has been having problems with worsening exercise intolerance,  peripheral edema, and fatigue.   He is having no chest pain.   His medications include furosemide taking 20 mg a day somewhat  erroneously, allopurinol, metoprolol 25 b.i.d., aspirin, lisinopril 20,  simvastatin, insulin.   On examination, his blood pressure was 144/60, his pulse was 53, his  weight was 359 pounds, up 7 pounds in the last 6 months.  His HEENT exam  was normal.  His neck veins were 7-8 cm.  His lungs were clear.  Heart  sounds were regular, but slow without significant murmur.  The abdomen  was protuberant.  The extremities had 2+ peripheral edema.  Neurological  exam was grossly normal.   Peripheral electrocardiogram dated today demonstrated sinus rhythm of 53  with intervals of 0.17/0.08/0.43.  The axis was leftward to -17.   IMPRESSION:  1. Coronary artery disease with totally occluded left anterior      descending.  2. Normal left ventricular function.  3. Morbid obesity.  4. Peripheral edema.  5. Bradycardia, question of chronotropic incompetence.  6. Beta-blocker potential contributing #5.   The first issue is to what he is chronotropically incompetent and his  metoprolol contributing.  His metoprolol dose was quite low, it has been  used in the setting of his coronary artery disease, we will initially  plan to stop it with potentially reinitiating it, but after stopping it,  he will get a Holter monitor and then exercise on his recumbent bike to  see if again he can identify maximal  heart rate.   As relates to his edema will have him go back to his Lasix of 40 mg a  day.  He is to weigh himself.  In 1 weeks' time if he has not lost 5  pounds, he is to double it to 40 mg twice daily.  We will plan to check  a BMET in 2 weeks' time.   We will plan to see him probably in about 3 months obviously within the  context of the above.     Duke Salvia, MD, [facility]  Electronically Signed    SCK/MedQ  DD: 08/07/2007  DT: 08/08/2007  Job #: [id number]

## 2010-06-14 NOTE — Cardiovascular Report (Signed)
NAMELEOVARDO, [name]               ACCOUNT NO.:  [id number]   MEDICAL RECORD NO.:  [id number]          PATIENT TYPE:  OIB   LOCATION:  6525                         FACILITY:  [facility]   PHYSICIAN:  Bruce R. Juanda Chance, MD, FACCDATE OF BIRTH:  10/09/1940   DATE OF PROCEDURE:  09/07/2006  DATE OF DISCHARGE:                            CARDIAC CATHETERIZATION   HISTORY:  [name] is 70 years old and has obesity sleep apnea and  exertional dyspnea and chest tightness.  He was evaluated by Dr. Graciela Husbands  and he arranged for him to have a Myoview scan which showed anterior  ischemia.  He was studied in the catheterization lab yesterday and found  to have a total occlusion of the left anterior descending artery.  We  brought him in today for an attempt at crossing and opening the  chronically totally occluded LAD.   PROCEDURE:  The procedure was performed via right femoral artery and  arterial sheath and a XBLAD-4 guiding catheter.  The patient was given  Angiomax bolus and infusion and had been loaded with Plavix and aspirin.  We initially attempted to cross the lesion with a light support wire and  this was unsuccessful.  We advanced a 2.25 x 15-mm over the wire balloon  across the wire.  We then exchanged for multiple wires in attempt to  cross the lesion.  We used a [employer] 4, 6 and 12 and a PT-2 and  a Cross-it wire.  Finally, we attempted to cross with Confienza wire and  this passed into what we thought was the diagonal branch.  We passed the  balloon over the wire and did a distal injection and realized that we  were in the pericardium.  We then pulled the wire in the balloon out.  Repeat studies did not show any active leak at the site of the wire  perforation.  We finally took a picture of the right coronary and  documented well-developed collaterals of the LAD.   The patient tolerated the procedure well and left the laboratory in  satisfactory condition.   RESULTS:  The  stenosis in the LAD was totally occluded proximally at the  beginning of procedure and remained totally occluded at the end of the  procedure.  There were two lesions in the LAD before the total  occlusion.   CONCLUSION:  Unsuccessful attempt at PCI of the chronically, totally  occluded, left anterior descending artery due to inability to cross the  occlusion with the wire.   RECOMMENDATIONS:  Will discuss the options with the patient.  These  would include medical therapy or surgical bypass.      Bruce Elvera Lennox Juanda Chance, MD, [facility]  Electronically Signed     BRB/MEDQ  D:  09/07/2006  T:  09/08/2006  Job:  [id number]   cc:   Duke Salvia, MD, [facility]  Cardiopulmonary Lab

## 2010-06-14 NOTE — Letter (Signed)
October 12, 2006   Duke Salvia, MD, [facility]  [address]  [address]  [place]  [place] [address]   Re:  ORIEL, RUMBOLD               DOB:  March 21, 1940   Dear Brett Canales,   I saw Mr. Chew back in the office today.  As you know, he is a very  nice 70 year old gentleman who works for [employer].  He had exertional  angina with  a totally occluded LAD.  He underwent off pump coronary  bypass grafting with his mammary to his LAD on 09/17/2006.  He has done  well postoperatively.  His biggest concern presently is that he has got  some numbness in the fourth and fifth fingers on his right hand and now  a little bit of weakness in the thumb and index on that side.  He is  working with physical therapy on that.  I will plan to see him back and  if that persists I am going to have him see a hand surgeon and possibly  do some nerve conduction studies, as the weakness is a very unusual  component and has really come up over the past couple of weeks since he  was discharged from the hospital.  From a cardiac standpoint he is doing  well.  As I said, I plan to see him back in about a month.  Please do  not hesitate to contact me if there is any assistance I can be in the  meantime.   Sincerely,   Salvatore Decent. Dorris Fetch, M.D.  Electronically Signed   SCH/MEDQ  D:  10/12/2006  T:  10/13/2006  Job:  [id number]   cc:   [name] A. Everardo All, MD

## 2010-06-14 NOTE — Assessment & Plan Note (Signed)
OFFICE VISIT   RYOSUKE, ERICKSEN  DOB:  02/16/1940                                        November 12, 2006  CHART #:  [id number]   HISTORY OF PRESENT ILLNESS:  The patient is a 70 year old gentleman who  presented in August with exertional angina.  He had a chronic, totally  occluded LAD and no other significant coronary disease.  He underwent  off-pump coronary artery bypass grafting x1 on 8/18.  He was seen back  in the office on 9/12, at which time he was doing well.  He did have  some brachial plexopathy affecting the fourth and fifth fingers of his  right hand.  He states that since his last visit he still does not have  as much energy as he would like.  He has been doing cardiac rehab and  that has been going well.  He has not had any anginal type chest pain or  severe shortness of breath but just generally still feels a generalized  lack of energy.  He is not having any significant surgical incisional  pain.  He has noted improvement in the fourth finger on the right hand  in terms of sensation and motor function but has not seen significant  improvement with the little finger.   PHYSICAL EXAMINATION:  GENERAL APPEARANCE:  On physical examination the  patient is a 70 year old, morbidly obese, white male in no acute  distress.  VITAL SIGNS: His blood pressure is 117/71, pulse 98, respirations are  18.  His oxygen saturation is 93% on room air.  CHEST: His sternal incision is clean, dry and intact and healing well.  Sternum is stable.  Chest tube sites are healing well.  EXTREMITIES:  His right hand has some weakness in the fifth finger in  terms of flexion and extension.  The fourth finger is essentially  intact.  There is still some decreased sensation on the fifth finger as  well.  He has 1+ peripheral edema.   IMPRESSION:  The patient is doing well at this point in time.  He looks  and feels better, although he is not totally happy with his energy  level  yet.  I suspect that that is not going to improve dramatically until he  is able to continue with an exercise program and lose a significant  amount of weight.  He is motivated and I think he will be able to do so.  His brachial plexopathy appears to be improved.  I told him it usually  takes on the order of 6 to 9 months to resolve, so we really will not  know whether there is any final deficit on that for quite a while yet,  but he is satisfied with his progress at this point.   PLAN:  The plan is for him to return to work on 11/18.  I have given him  a note to that effect.  He needs a blood pressure cuff for home use and  needs an extra large cuff, so I have written him a prescription for  that.  He will continue to be followed by Dr. Sherryl Manges.  I would be  happy to see him back any time if I could be of any further assistance.   Salvatore Decent Dorris Fetch, M.D.  Electronically Signed   SCH/MEDQ  D:  11/12/2006  T:  11/13/2006  Job:  [id number]   cc:   Duke Salvia, MD, [facility]  Sean A. Everardo All, MD

## 2010-06-14 NOTE — Assessment & Plan Note (Signed)
OFFICE VISIT   HAITHAM, DOLINSKY  DOB:  [birth year]                                        October 12, 2006  CHART #:  [id number]   REASON FOR VISIT:  Postoperative followup.   Mr. Murty is a 70 year old gentleman, who presented back in August with  exertional angina.  At catheterization, he had a chronically totally  occluded LAD.  There was no significant disease in his other coronaries.  Attempted angioplasty was non-successful, and he underwent off-pump  coronary bypass grafting x1 on August the 18th.  Postoperatively he did  well, but a little bit slow in terms of leaving the hospital mainly due  to his size and his wife's inability to help him in and out of bed and  things of that nature but had no significant complications.  He has  continued to do well since he has been discharged home.  He is now about  three weeks postoperative, and he has had significant improvement in his  soreness, he has only taken pain pills once in the last five days, he is  walking without any anginal-type symptoms, and has noted some gradual  improvement in his walking.  He is scheduled for his cardiac rehab  orientation a week from yesterday, and then a week from Monday he will  start with the cardiac rehab.  His main complaint is that he had some  numbness in the fourth and fifth fingers of his right hand in the early  postoperative period, but now he says he has noted some normal sensation  but some weakness in the thumb, forefinger, and middle finger on that  hand.   PHYSICAL EXAMINATION:  General:  Mr. Anastacio is a 70 year old gentleman  in no acute distress.  Vital signs:  His blood pressure is 123/69, pulse  69, respirations are 18, his oxygen saturation is 93% on room air.  Lungs:  Clear with equal breath sounds bilaterally.  Cardiac:  Regular  rate and rhythm, normal S1 and S2, no rubs, murmur, or gallops.  Chest:  His sternal incision is clean, dry, and intact  and healing well with no  signs of infection.  His sternum is stable.  Right upper extremity:  He  has decreased sensation on the fourth and fifth fingers.  There is  possibly a slight weakness, although it is difficult to determine on a  gross exam on the thumb and forefinger.  Pulses are intact.   Chest x-ray shows good aeration of the lungs bilaterally.   IMPRESSION:  Mr. Criswell is a 70 year old gentleman.  He is status post  off-pump coronary bypass grafting x1.  He is doing very well at this  point in time.  He has not had any anginal symptoms.  He is increasing  his physical activities.  I think he is a little ahead of the curve in  terms of recovery in terms of how he is feeling and pain and exercise  tolerance, but I am concerned about this right hand.  It is not uncommon  to have some stretch on the ulnar component of the brachial flexus  particularly with a gentleman of his size, but the motor function in the  right hand is a little concerning.  He has been working with the  physical therapist on that, and I offered  to send him to see a hand  surgeon at this time, but he wishes to wait and I will plan to see him  back in about four weeks to check on that issue.  In terms of returning  to work, I have recommended that he wait about three months from the  time of his surgery, which would be mid November.  If he feels like he  is ready before that, we can discuss possibly letting him go back a  little bit sooner but for now I think mid November is our target.   Salvatore Decent Dorris Fetch, M.D.  Electronically Signed   SCH/MEDQ  D:  10/12/2006  T:  10/13/2006  Job:  [id number]   cc:   Duke Salvia, MD, [facility]  Sean A. Everardo All, MD

## 2010-06-14 NOTE — Assessment & Plan Note (Signed)
[facility]                         ELECTROPHYSIOLOGY OFFICE NOTE   NAME:Suderman, HADY NIEMCZYK                      MRN:          [id number]  DATE:01/15/2007                            DOB:          10-11-1940    Mr. Ackert is now four months status post CABG.  He is doing well.  Unfortunately, he has not been able to progress in weight loss.  He  continues to be dyspneic but he is exercising about an hour a day.   Dr. Everardo All is following his lipids.   MEDICATIONS:  1. Simvastatin 40.  2. Lisinopril 20.  3. Aspirin.  4. Toprol 25.  5. Allopurinol.  6. Furosemide 80 b.i.d.  7. Insulin.   PHYSICAL EXAMINATION:  VITAL SIGNS:  Blood pressure is 128/62, pulse 54,  weight 352 which is unchanged and if anything, up 7 pounds since  September.  LUNGS:  Clear.  CARDIOVASCULAR:  Regular.  EXTREMITIES:  Without edema.   Electrocardiogram dated today demonstrated sinus rhythm at 54 with  intervals of 0.17/0.08/0.44.  Electrocardiogram was notable for rapid  transition in lead V2.   IMPRESSION:  1. Ischemic heart disease.      a.     Status post coronary artery bypass grafting.      b.     Normal left ventricular function.  2. Morbid obesity.  3. Exercise intolerance, likely related to #2.   Mr. Capistran is doing quite well.  I do not know his lipid status.  I have  asked him to follow up with Dr. Everardo All about that.  We will plan to see  him again in six months' time.  We spent a long time reviewing the  importance of managing his diet and weight loss.     Duke Salvia, MD, [facility]  Electronically Signed    SCK/MedQ  DD: 01/15/2007  DT: 01/15/2007  Job #: [id number]   cc:   Salvatore Decent. Dorris Fetch, M.D.  Sean A. Everardo All, MD

## 2010-06-14 NOTE — Consult Note (Signed)
NAMEGRAHM, ETSITTY               ACCOUNT NO.:  [id number]   MEDICAL RECORD NO.:  [id number]          PATIENT TYPE:  OIB   LOCATION:  6525                         FACILITY:  [facility]   PHYSICIAN:  Salvatore Decent. Dorris Fetch, M.D.DATE OF BIRTH:  December 05, 1940   DATE OF CONSULTATION:  09/07/2006  DATE OF DISCHARGE:  09/08/2006                                 CONSULTATION   REASON FOR CONSULTATION:  This single-vessel coronary disease.   HISTORY OF PRESENT ILLNESS:  Mr. Dewoody is a 70 year old gentleman with  multiple cardiac risk factors including morbid obesity, insulin-  dependent diabetes, obstructive sleep apnea, dyslipidemia, and  hypertension.  He, over the past 6 to 7 months, has noted progressive,  really worsening exercise intolerance.  He now has gotten to the point  where he gets short of breath with any activity.  He has also had some  intermittent chest discomfort, which is nonspecific and not particularly  related to activity.  He saw Dr. Shelle Iron and had a pulmonary evaluation,  which showed no underlying pulmonary cause for his dyspnea, and was  referred to Dr. Graciela Husbands.  He had a stress Myoview study which showed an  ejection fraction of 57%, with some mild anterior apical ischemia.  Yesterday, he underwent cardiac catheterization, where he was found to  have a totally occluded LAD.  His right coronary and circumflex had no  significant disease.  Today, an attempt was made to perform PCI on this  lesion, but they were unable to cross with a wire.  He now is referred  for consideration for coronary artery bypass grafting.  The patient has  been asymptomatic, while in the hospital.   PAST MEDICAL HISTORY:  His past medical history significant for type 2  insulin-dependent diabetes, hypertension, dyslipidemia, obstructive  sleep apnea, morbid obesity, history of the right pneumonia with  parapneumonic effusion requiring VATS and decortication, peripheral  neuropathy, cholecystectomy,  appendectomy.   MEDICATIONS ON ADMISSION:  1. Lisinopril 40 mg daily.  2. Enteric-coated aspirin 81 mg a day.  3. Lasix 40 mg daily.  4. Zocor 40 mg q.h.s.  5. Lantus insulin 80 units subcu q.h.s.  6. Humalog insulin 30 units subcu q.a.m., 30 units subcu at lunch and      50 units subcu at supper.   ALLERGIES:  HE HAS AN ALLERGY TO PENICILLIN.   FAMILY HISTORY:  No known history of cardiac disease.   SOCIAL HISTORY:  He lives in [place] with his wife.  He does not  smoke or drink.  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   REVIEW OF SYSTEMS:  He does have nocturia, gets peripheral edema.  He  gets short of breath with exertion, other symptoms.  Other symptoms as  noted in HPI.  No recent change in bowel or bladder habits.  No history  of excessive bleeding.  All other systems are negative.   PHYSICAL EXAMINATION:  GENERAL:  Mr. Freiermuth is a morbidly obese 65-year-  old white male in no acute distress.  He is obese, well-developed.  VITAL SIGNS:  His blood pressure is 111/54, pulse  64, respirations 18.  He is afebrile.  His oxygen saturations is 96% on 2 liters nasal  cannula.  NEUROLOGICAL:  He is alert and oriented x3.  He is appropriate and  grossly intact.  HEENT:  Exam is unremarkable.  NECK:  Supple without thyromegaly, adenopathy or bruits.  LUNGS:  Clear.  CARDIAC:  Exam has regular rate and rhythm.  Normal S1 and S2.  No  murmurs, rubs or gallops.  ABDOMEN:  Obese.  EXTREMITIES:  Without clubbing or cyanosis.  He does have 1+ edema in  both lower extremities.  SKIN:  Warm and dry.   LABORATORY DATA:  Blood gas from today showed a pH 7.36, pCO2 of 45, pO2  67, bicarbonate is 26, sodium 134, potassium 4.3, BUN and creatinine 27  and 1.13, glucose was 202, PTT 29.2, PT is 11.4, TSH is 2.07.  His white  count was 5.9, hematocrit 38, platelets 324, hemoglobin A1c is 7.9.  LFTs are within normal limits, except his albumin is low at 3.3.  His  cholesterol was 136,  HDL was 29.7, LDL 73.   Carotid Dopplers showed no evidence of ICA stenosis bilaterally.  EKG  shows sinus bradycardia.   IMPRESSION:  Mr. Preziosi is a 70 year old gentleman with multiple cardiac  risk factors, who presents with progressive exertion and progressive  exercise intolerance.  He has not had any clear anginal symptoms, but  certainly with his diabetes he could be a potential for a silent  ischemia.  However, he has a totally occluded LAD, which does fill via  collaterals, and the LAD is chronically occluded, so I do not think we  are in an unstable situation.  He needs coronary bypass grafting for a  myocardial preservation, to allow exercise tolerance so that he will be  able to exercise and lose weight for his long-term benefit for his  overall health.  He is at increased risk, given his obesity, sleep apnea  and what appears to be rather brittle diabetes.  I have discussed in  detail with the patient the nature and extent of the operation including  need for general anesthesia, incision to be used, possibility of off-  pump grafting depending on operative findings, although I am not  optimistic given his body habitus.  He does understand that there is  risk of both early and late graft failure; however, the patency rate for  left internal mammary arteries of the LAD is 90% to 95% long-term.  He  does understand that the risk of surgery include but are not limited to  death, stroke, MI, DVT, PE, bleeding, possible need for transfusions,  infections, as well as other organ system dysfunction including  respiratory, renal or GI complications.  He understands, accepts these  risks and agrees to proceed.   Mr. Schoeneck received 300 mg of Plavix prior to each catheterization  period, which is 600 mg in the last 2 days.  I advised him of the  increased bleeding risk with Plavix in the setting, undergoing cardiac  surgery, and that the standard of care is to wait 5 to 7 days prior  to  proceeding with elective coronary bypass grafting to decrease his  bleeding risk.  The patient understands this and wishes to go home in  the interim, while he waits for surgery.  We will discuss this with  cardiology.      Salvatore Decent Dorris Fetch, M.D.  Electronically Signed     SCH/MEDQ  D:  09/07/2006  T:  09/09/2006  Job:  [id number]   cc:   Duke Salvia, MD, [facility]  Sean A. Everardo All, MD  Ines Bloomer, M.D.

## 2010-06-17 NOTE — Consult Note (Signed)
NAMEJONTA, [name]                           ACCOUNT NO.:  [id number]   MEDICAL RECORD NO.:  [id number]                   PATIENT TYPE:  INP   LOCATION:  2006                                 FACILITY:  [facility]   PHYSICIAN:  Abigail Miyamoto, M.D.              DATE OF BIRTH:  08/02/1940   DATE OF CONSULTATION:  02/16/2003  DATE OF DISCHARGE:                                   CONSULTATION   REFERRING PHYSICIAN:  Rene Paci, M.D. [facility]   CHIEF COMPLAINT:  Nausea.   HISTORY OF PRESENT ILLNESS:  This is a very pleasant 70 year old gentleman  who has been evaluated extensively for his severe nausea that has been going  on for several months now.  He recently had a Hiatus scan which did show him  to have basically no ejection fraction, which they even read as -14% and an  ultrasound showing him to have gallbladder sludge.  He has postprandial  nausea with most all fatty meals, but is able to eat some liquids and low  fat foods.  He has had no jaundice with this.  He has had minimal abdominal  pain other than severe nausea and has been loosing weight secondary to this.  He recently did have severe pneumonia and now has a parapneumonic effusion  which is being followed by Marcelyn Bruins, M.D. [facility].  He has also been  followed essentially by Judie Petit T. Russella Dar, M.D. [facility] and Teena Irani. Arlyce Dice, M.D.  Currently, he is weak-feeling.  He denies any recent melena and has had no  hematemesis.  He had a hemoglobin recently which was 10 on admission and now  it is 7.8, but he has no overt signs of bleeding.  He denies any chest pain,  fever, shortness of breath, and the rest of his review of systems is  otherwise normal.  He has also had no dysuria and again no jaundice.   PAST MEDICAL HISTORY:  Significant for the parapneumonic effusion.  He also  has adult onset diabetes mellitus, hypertension, and dyslipidemia.   MEDICATIONS:  1. Actos.  2. Lipitor.  3. Diovan.  4. Lantus.   PAST SURGICAL  HISTORY:  Appendectomy.  He has also had a negative  colonoscopy except for polypectomy approximately four years ago.   ALLERGIES:  No known drug allergies.   SOCIAL HISTORY:  He does not smoke and does not drink alcohol.   REVIEW OF SYSTEMS:  Otherwise unremarkable.   PHYSICAL EXAMINATION:  GENERAL:  Mildly obese gentleman who is weak in  appearance and slightly pale, but otherwise in no acute distress.  VITAL SIGNS:  He is afebrile.  His vital signs are stable.  Blood pressure  currently is 90/45, pulse 66, respiratory rate 60, and temperature 98.6.  HEENT:  Eyes; he is anicteric.  Pupils equal, round, and reactive  bilaterally.  Ears, nose, mouth, and throat; external ears and nose is  normal.  Hearing is normal.  Oropharynx is clear.  NECK:  Supple, there is no cervical adenopathy or thyromegaly.  LUNGS:  Clear to auscultation bilaterally except for mild decrease in the  right base with normal effort.  HEART:  Regular rate and rhythm with no murmurs.  There is no peripheral  edema.  ABDOMEN:  Soft and nontender.  There are no masses.  There is a well-healed  incision in the right lower quadrant.  There are no hernias and there is no  organomegaly.  EXTREMITIES:  Warm and well-perfused.   LABORATORY DATA:  The patient currently has a white blood cell count  elevated at 4.8, hemoglobin 7.8, hematocrit 23.9, and platelets of 745.  Neutrophils 82%.  Liver function tests are normal with a bilirubin of 0.5,  alkaline phosphatase 55, BUN and creatinine 18 and 1.2.  Again he has a -14%  ejection fraction, but the cystic duct and common bile duct are patent.  Gallbladder ultrasound shows sludge, but a normal common bile duct and  gallbladder wall and no stones.   IMPRESSION:  This is a patient with severe biliary dyskinesia and probable  chronic cholecystitis as well as anemia.   PLAN:  Proceed with laparoscopic cholecystectomy.  GI is evaluating the  patient and will evaluate the  anemia further and he is apparently going to  be transfused packed red blood cells.  They may want to proceed with a  colonoscopy or further evaluation of a GI tract prior to laparoscopic  cholecystectomy.  We will perform the cholecystectomy as soon as medically  cleared.                                               Abigail Miyamoto, M.D.    DB/MEDQ  D:  02/16/2003  T:  02/17/2003  Job:  [id number]   cc:   Rene Paci, M.D. [facility]  [address] [place], [place] [address]   Venita Lick. Russella Dar, M.D. [facility]   Teena Irani. Arlyce Dice, M.D.  [address]  [place]  [place] [address]  Fax: [phone]

## 2010-06-17 NOTE — Discharge Summary (Signed)
[name], [name]                           ACCOUNT NO.:  [id number]   MEDICAL RECORD NO.:  [id number]                   PATIENT TYPE:  INP   LOCATION:  5712                                 FACILITY:  [facility]   PHYSICIAN:  Rene Paci, M.D. [facility]          DATE OF BIRTH:  [birth year]   DATE OF ADMISSION:  02/16/2003  DATE OF DISCHARGE:                                 DISCHARGE SUMMARY   DISCHARGE DIAGNOSES:  1. Microcytic iron-deficiency anemia.  2. Chronic cholecystitis.  3. Nausea and anorexia.  4. Parapneumonic effusion and empyema.  5. Dyspnea.  6. Fever.  7. Leukocytosis.  8. Thrombocytosis.   BRIEF ADMISSION HISTORY:  [name] is a 70 year old white male who  presented with several months of nausea and dyspnea. The patient has a known  large right pleural effusion secondary that has been diagnosed as a  parapneumonic effusion. This has been treated unsuccessfully as an  outpatient. The patient recently had a GI workup that revealed a  nonfunctioning gallbladder, and the patient was admitted for both pulmonary  and GI evaluation.   PAST MEDICAL HISTORY:  1. Peripheral sensory neuropathy.  2. Multinodular goiter.  3. Osteoarthritis.  4. Hypertension.  5. Dyslipidemia.  6. History of colonoscopy in October 2001 which was unremarkable except for     polyps.  7. Adult-onset diabetes mellitus insulin requiring.   HOSPITAL COURSE:  1. Gastrointestinal. The patient presented with a microcytic iron-deficiency     anemia. His hemoglobin was 7.8 on admission. He required transfusion. The     patient was evaluated by GI. They felt that a colonoscopy was indicated     to rule out colonic neoplasm versus AVM. The patient underwent     colonoscopy on February 17, 2003. This was negative. He then underwent a     small-bowel follow through to rule out AVMs which was normal. The patient     had already been evaluated by surgery regarding his chronic     cholecystitis. The plan  was for the patient to undergo laparoscopic     cholecystectomy. The patient underwent laparoscopic cholecystectomy with     a normal cholangiogram on February 19, 2003. Shortly after, the patient's     nausea and anorexia did improve.  2. Iron-deficiency anemia. The patient had an extensive GI workup including     endoscopy and colonoscopy as well as small-bowel follow through. There     was no evidence for GI source for his iron deficiency. It was felt to be     more nutritional deficit. No further GI workup was felt to be indicated.  3. Pulmonary. As noted, the patient had a large right parapneumonic     effusion. The patient was evaluated by CVTS who recommended a VATS with     decortication. On February 24, 2003, the patient underwent right VATS with     right thoracotomy and empyema drainage and decortication.  Postoperative     diagnosis was a chronic loculated parapneumonic effusion. The patient has     slowly and steadily recovered from his VATS procedure. The patient has     been slowly weaned off any oxygen and using his CPAP at night as he was     at home. The patient also carries a diagnosis of OSA. The patient was     followed intermittently by pulmonary while in the hospital. The patient     developed lower extremity edema for which pulmonary treated the patient     aggressively with diuretics including Aldactazide and Lasix with good     results.  4. Infectious disease. The patient's wound culture did grow     Peptostreptococcus. The patient was seen in consultation by ID who     recommended a three-week course of antibiotics. After Unasyn, the patient     was changed to oral Augmentin. The patient has completed seven days of     his 21 days of antibiotics in the hospital. He is currently afebrile, and     his white count is normal.  5. Adult-onset diabetes mellitus, insulin requiring. The patient is     requiring less Lantus than during his initial hospitalization. This is      probably secondary to the healing of his empyema. Currently, the     patient's blood sugars range from 100 to 150.  6. Disposition. Initially, the plan was for the patient to discharge to a     short-term skilled nursing facility as he is quite debilitated after his     prolonged illness and lengthy hospitalization; however, the patient has     progressed to the point where it is felt he is stable for discharge home     with continued therapies at home.   LABORATORY DATA AT DISCHARGE:  Sodium 132, potassium 4.2, BUN 13, creatinine  1.1. Hemoglobin was 10.3, hematocrit 31, platelet count 681. Sed rate on  January 31 was 93. LFTs were normal. BNP on March 02, 2003 was 395. TSH  was 3.227. Total iron was low at 15, TIBC was low at 122, percent saturation  was low at 12, B12 was elevated at over 1,000, ferritin elevated over 1,600,  folate  9.8. CEA was 1.1. Blood cultures remained negative from February 18, 2003 and February 20, 2003. Right pleural fluid culture grew moderate  Peptostreptococcus species. Urine cultures remained negative from February 17, 2003 and February 19, 2003. Anaerobic cultures were negative. Cultures  for AFB were negative. Cultures for fungus were negative. Pathology of the  right pleural revealed chronic inflammation and pyrosis. No malignancy.  Pathology from the right lung upper lobe wedge again revealed inflammation  and benign without any evidence of malignancy. Right lung lower lobe wedge  biopsy revealed acute fibrinous pleuritis with benign lung parenchyma and  inflammation. The patient was transfused a total of 6 units of packed red  blood cells during this admission.   MEDICATIONS AT DISCHARGE:  1. Actos 45 mg q.d.  2. Protonix 40 mg q.d.  3. Iron sulfate 325 mg b.i.d.  4. Aldactazide 50/50 b.i.d.  5. Augmentin 875 mg b.i.d. for 14 days.  6. Lantus 40 mg q.h.s.  7. Potassium chloride 40 mEq q.d.  8. Lasix 80 mg q.d. 9. Percocet 325 one to two q.4h.  p.r.n.  10.      Ultram 50 mg q.6h. p.r.n.   FOLLOW UP:  Follow up with Dr. Everardo All  in one to two weeks. The patient is  instructed to follow up with Dr. Edwyna Shell as instructed.      Cornell Barman, P.A. [facility]                  Rene Paci, M.D. [facility]    LC/MEDQ  D:  03/09/2003  T:  03/09/2003  Job:  [id number]   cc:   [name] A. Everardo All, M.D. [facility]   Ines Bloomer, M.D.  [address]  [place]  [place] [address]   Abigail Miyamoto, M.D.  [address]  [place]  [place] [address]  Fax: [phone]

## 2010-06-17 NOTE — Consult Note (Signed)
[name], [name]                           ACCOUNT NO.:  [id number]   MEDICAL RECORD NO.:  [id number]                   PATIENT TYPE:  INP   LOCATION:  2006                                 FACILITY:  [facility]   PHYSICIAN:  Ines Bloomer, M.D.              DATE OF BIRTH:  [birth year]   DATE OF CONSULTATION:  DATE OF DISCHARGE:                                   CONSULTATION   HISTORY:  This 70 year old diabetic was treated at [facility] with right pleuritic pain and was found to have a right lower-lobe  pneumonia with pleural thickening.  He was first thought to have a pulmonary  embolus, but then was treated for right lower-lobe pneumonia.  He was then  seen in followup by Dr. Shelle Iron for a followup CT scan done in November that  showed a right pleural process of the right lower lobe and the right lateral  chest wall.  He was discussed by Dr. Shelle Iron that he probably would need to  have VATs drainage of this, but the patient was somewhat reluctant to have  it.  For the past six weeks, he has had nausea and vomiting.  He had an  upper endoscopy which was negative.  He had a PIPIDA scan which was markedly  positive.  Somewhere around two weeks ago, he had episodes and melena, and  was admitted to the hospital with a hemoglobin of 8.5.  He was seen by Dr.  Carolynne Edouard for a possible cholecystectomy.  Repeat CT scan showed an increasing  right pleural process, particularly lateral with septation involving the  right upper lobe and the right lower lobe.  He also has chronic sleep apnea.  I was asked to see him for possible decortication.   MEDICATIONS:  See the chart.   ALLERGIES:  He is allergic to LAMISIL.   FAMILY HISTORY/SOCIAL HISTORY:  Recorded in the chart.   REVIEW OF SYSTEMS:  He has had nausea and vomiting, and has been unable to  eat.  A low grade temperature.  No hemoptysis, no fever.  Continued weakness  with dyspnea on exertion that has increased recently.  No  dysuria.  No  evidence of TIA, dysesthesia or DVT.   PHYSICAL EXAMINATION:  GENERAL:  He is a well-developed, Caucasian male, in  no acute distress.  VITAL SIGNS:  Blood pressure 130/80, pulse 100, respirations 18.  Oxygen  saturation 92%.  HEENT:  Head, eyes, ears, nose and throat are really unremarkable.  CHEST:  Decreased breath sounds in the right lateral space.  There is no  wheezing.  HEART:  Regular sinus rhythm, no murmur.  ABDOMEN:  Soft with tenderness on the right upper__________.  Bowel sounds  are normal.  PULSES:  The pulses are 2+.  There is 1+ edema.  No clubbing.  NEUROLOGIC:  Intact.   IMPRESSION:  1. I feel [name] has chronic empyema.  2. Cholecystitis.  3. Anemia, questionable gastrointestinal source.  4. I feel [name] will need a VATS decortication in the near future.  I     am not sure whether he should have a cholecystectomy first, and then     followed by the VATS decortication.  That might be the best way to go,     but the first thing we need to do is to be sure there is no problem to     determine the source of anemia.                                               Ines Bloomer, M.D.    DPB/MEDQ  D:  02/17/2003  T:  02/17/2003  Job:  [id number]

## 2010-06-17 NOTE — H&P (Signed)
[name], [name]                           ACCOUNT NO.:  [id number]   MEDICAL RECORD NO.:  [id number]                   PATIENT TYPE:  INP   LOCATION:  2006                                 FACILITY:  [facility]   PHYSICIAN:  Sean A. Everardo All, M.D. [facility]           DATE OF BIRTH:  15-Nov-1940   DATE OF ADMISSION:  02/16/2003  DATE OF DISCHARGE:                                HISTORY & PHYSICAL   REASON FOR ADMISSION:  Nausea and shortness of breath.   HISTORY OF PRESENT ILLNESS:  The patient is a 70 year old man with several  months of severe nausea.  It was felt to possibly be due to his pleural  effusion, but a work-up recently found a non functional gallbladder.  He has  an upcoming appointment with Dr. Carolynne Edouard of [facility], but he  reports that he is completely unable to take solid food and is losing  weight.   The patient also states he has an increase in his chronic dyspnea on  exertion recently.  He states he is getting shortness of breath after only a  few steps and he is sometimes stumbling as he walks.   Regarding his diabetes, he currently takes Lantus 80 units a day.  He also  takes Actos 45 mg p.o. daily.  His glucoses are improved to the low 100's.  Plan has been to change him to mealtime therapy, but progress on his  diabetes has been slowed by the recurrent urgent need to address his other  medial problems.   PAST MEDICAL HISTORY:  1. Peripheral sensory neuropathy.  2. Multi-nodular goiter.  3. Low back pain which is probably due to osteoarthritis.  4. Hypertension.  5. Dyslipidemia.  6. The patient had a colonoscopy on November 17, 1999 for rectal bleeding and     it was normal except for polyps.  He had an upper endoscopy on February 04, 2003 for nausea which was normal.   MEDICATIONS:  1. Lantus.  2. Actos as noted above.  3. Diovan 40 mg daily.  4. A multivitamin one daily.  5. He should also be on Lipitor, but he discontinued this on his  own.   SOCIAL HISTORY:  The patient is married and is retired.   FAMILY HISTORY:  His wife is not ill recently.   REVIEW OF SYSTEMS:  He denies the following:  Fever, syncope, chest pain,  cough, heart burn, vomiting, diarrhea, dysuria, hematuria, and hypoglycemia.   PHYSICAL EXAMINATION:  VITAL SIGNS:  Blood pressure 120/70, heart rate is  90, temperature is 98.6, the weight is 268.  GENERAL:  He appears chronically ill.  He does not seem short of breath at  rest.  SKIN:  Pale, it is not diaphoretic.  HEENT:  Head is atraumatic.  The pharynx is clear.  NECK:  Supple.  CHEST:  Clear to auscultation except for decreased breath sounds on  the  right.  CARDIOVASCULAR:  No jugular venous distension, no edema. Regular rate and  rhythm, no murmur.  Carotid arteries - no bruit.  ABDOMEN:  Soft, nontender, no hepatosplenomegaly, no mass.  RECTAL EXAMINATION:  Not done at that time due to patient's shortness of  breath.  MUSCULOSKELETAL:  Gait is observed to be slightly unsteady.  NEUROLOGIC:  Alert, well oriented, did not appear anxious nor depressed.   Chest x-ray - a pleural effusion is noted which appears to be adherent to  the right lateral pleura.  Compared to printed reports in the medical  record, x-ray today does not appear to be any different.  Oxygen saturation  on room air is 97% at rest.   I am advised from the hospital that his hemoglobin is 7.8%.   IMPRESSION:  1. Increasing shortness of breath.  I am uncertain to what extent, if any,     this is related to his pleural effusion.  2. Worsening anemia.  3. Nausea with a non functional gallbladder.  4. Diabetes with improved home glucoses.   PLAN:  1. Admit to [facility].  2. Check electrocardiogram, echocardiogram and Cardiolite.  3. Consult pulmonary and general surgery.  4. Diet as tolerated.  5. In the hospital, will reduce Insulin for safety reasons.  6. When he is recovered from these illnesses,  attention can be turned back     to his diabetes to optimize his diabetes therapy.  7. I discussed code status with patient.  He states he wants to be full     code, but would not want to be started nor maintained on artificial life     support systems if there was not a reasonable chance of a functional     recovery.                                                Sean A. Everardo All, M.D. [facility]    SAE/MEDQ  D:  02/16/2003  T:  02/17/2003  Job:  [id number]

## 2010-06-17 NOTE — Discharge Summary (Signed)
NAMECLOYD, RAGAS                           ACCOUNT NO.:  [id number]   MEDICAL RECORD NO.:  [id number]                   PATIENT TYPE:  INP   LOCATION:  5504                                 FACILITY:  [facility]   PHYSICIAN:  Rene Paci, M.D. [facility]          DATE OF BIRTH:  May 25, 1940   DATE OF ADMISSION:  12/28/2002  DATE OF DISCHARGE:                                 DISCHARGE SUMMARY   DISCHARGE DIAGNOSES:  1. Intractable nausea and vomiting.  2. Low-grade fever.  3. Leukocytosis.  4. Right loculated parapneumonic effusion.  5. Reactive thrombocytosis.   BRIEF ADMISSION HISTORY:  Mr. [name] is a 70 year old white male who has a  history of pneumonia.  He was treated for this at [facility].  He subsequently developed a loculated parapneumonic effusion.  The patient had been seen as an outpatient by Dr. Shelle Iron.  However, on the  Wednesday prior to this admission he was seen in the office by Dr. Jonny Ruiz.  He  was found to have a persistently abnormality chest x-ray and fever.  He was  started on Levaquin.  The patient states his fever was better but he  presents with intractable nausea and vomiting.   PAST MEDICAL HISTORY:  1. Adult-onset diabetes mellitus, insulin requiring.  2. Hypertension.  3. Dyslipidemia.   HOSPITAL COURSE:  #1 - GASTROINTESTINAL.  The patient presented with  intractable nausea and vomiting.  The patient's lipase, amylase, and LFTs  were normal.  We did do an abdominal ultrasound which was also normal.  We  suspect that this was likely medication related, most likely secondary to  the Levaquin but we also did hold the Glucophage.  The patient's symptoms  have improved and he is currently tolerating a diet.  The patient will be  instructed not to resume his Levaquin and will also hold his Glucophage  until he sees Dr. Jonny Ruiz.   #2 - PULMONARY.  As noted the patient has a history of a right loculated  parapneumonic pleural effusion  following a pneumonia for which he was  treated at [facility].  The patient was seen in consultation by Dr.  Shelle Iron.  Dr. Shelle Iron noted that he had had discussions with the patient  extensively regarding the options for treatment of his effusion.  The  patient had opted for conservative surveillance to see if the effusion would  resolve on its own.  Dr. Shelle Iron made indications for when a  VATS/decortication should be pursued.  These are as follows:  1. Persistent pleuritic pain which is interfering with his quality of life.  2. Recurrent fever off of antibiotics with no obvious source identified.  3. Persistent dyspnea on exertion which interferes with his quality of life     (due to a trapped lung).   Dr. Shelle Iron agreed with our plans for discontinuing his antibiotics.  He did  note, again, if he developed fever without  any obvious source that he needs  surgery.   LABORATORY DATA AT DISCHARGE:  White count was 14.4, hemoglobin 10.5,  hematocrit 31.2.  LFTs were normal.  Amylase, lipase was normal.   MEDICATIONS AT DISCHARGE:  1. Actos 45 mg daily.  2. Lipitor 10 mg daily.  3. Diovan 160 mg daily.  4. Lantus 60 units q.h.s.   He has been instructed to hold his Glucophage and Glucotrol until he sees  Dr. Jonny Ruiz and to not take his Levaquin.  He is to follow up with Dr. Jonny Ruiz in  one to two weeks and Dr. Shelle Iron as needed.      Cornell Barman, P.A. [facility]                  Rene Paci, M.D. [facility]    LC/MEDQ  D:  12/31/2002  T:  12/31/2002  Job:  [id number]   cc:   Marcelyn Bruins, M.D. [facility]   Corwin Levins, M.D. [facility]

## 2010-06-17 NOTE — Op Note (Signed)
[name], ARMSTRONG                           ACCOUNT NO.:  [id number]   MEDICAL RECORD NO.:  [id number]                   PATIENT TYPE:  INP   LOCATION:  2399                                 FACILITY:  [facility]   PHYSICIAN:  Ines Bloomer, M.D.              DATE OF BIRTH:  1940/11/07   DATE OF PROCEDURE:  02/24/2003  DATE OF DISCHARGE:                                 OPERATIVE REPORT   PREOPERATIVE DIAGNOSIS:  Empyema, right chest.   POSTOPERATIVE DIAGNOSIS:  Empyema, right chest.   OPERATION PERFORMED:  Right video assisted thoracoscopic surgery, right  thoracotomy.  Drainage of empyema with decortication.   SURGEON:  Ines Bloomer, M.D.   ASSISTANT:  Carmin Muskrat. Eustaquio Boyden.   ANESTHESIA:  General.   INDICATIONS FOR PROCEDURE:  This 70 year old male had a chronic empyema  after having been treated for pneumonia in October of 2004.  He was noted to  have the empyema in 2005, elected not to undergo surgery at this time and  came in with right upper quadrant pain, anemia.  CT scan revealed a chronic  empyema of the right chest.   DESCRIPTION OF PROCEDURE:  He had had a previous cholecystectomy and was  brought to the operating room for drainage of the empyema.  After he was  turned to the right lateral thoracotomy position, he was prepped and draped  in the usual sterile manner.  Two trocar sites were made in the posterior  axillary line at the eighth intercostal space.  Dissection was carried down  entering the empyema cavity and evacuating 200 mL of pus.  It was obvious  that there was a marked amount of scar tissue around the empyema.  There was  a chronic empyema, so a posterolateral thoracotomy incision was made.  It  was carried down with electrocautery through the subcutaneous tissue and  fascia.  This was over the fifth intercostal space.  The latissimus was  divided and the serratus was dissected up and reflected anterior. The fifth  intercostal space was  entered.  The empyema cavity was entered posteriorly  and slowly, but surely, the empyema pleura was taken in an extrapleural  dissection off the chest wall superiorly and inferiorly and medially and  laterally.  After this had been dissected down a Finochietto retractor was  inserted and Tuffier was placed at right angle.  Dissection was then started  posteriorly.  As we went posteriorly, we slowly but surely found the lung  and the lung was dissected up superiorly and off the apex and then  inferiorly down to the costophrenic angle.  There were dense adhesions  between the diaphragm and the right lower lobe and this had to be taken down  by sharp and blunt dissection, finally freeing up the right lower lobe and  then going medially and freeing up the right middle lobe off the diaphragm  and then along  the mediastinum freeing up the anterior segment of the right  upper lobe.  When the lung had been completely freed up, all inflammatory  tissue which was very thick, was dissected off the diaphragm and off the  pleura doing an extra pleural dissection of all this inflammatory tissue.  After this had been done, attention was then turned to the visceral pleura  and this thickened pleura was dissected off first the right upper lobe and  down the major fissure.  Then next, the right lower lobe was decorticated,  dissecting it off with sharp and blunt dissection using Kitners.  Then the  rest of the major fissure was dissected free.  Attention was then turned to  the minor fissure and this was dissected free also.  There were areas of the  right middle lobe that had tears where the lung has been dissected free and  this was oversewn with 3-0 Vicryl.  Several areas of necrotic lung was  resected with EZ-45 stapler, one piece from the upper lobe and two pieces  from the lower lobe.  After the upper lobe and lower lobe had been  decorticated, attention was turned to the middle lobe and this was  slowly  dissected.  The thickened pleura was then dissected off the middle lobe.  All evidence of tears in the lung were repaired with 3-0 Vicryl.  A chest  tube was placed anteriorly through a separate stab wound and tied in place  with 0 silk.  Through the posterior trocar site which had been enlarged to  approximately 3 to 4 cm in order to help free the lower lobe from the  diaphragm.  Two chest tubes were placed, a right angle 36 chest tube and a  straight 36 chest tube.  These were sutured in place with 0 silk.  The chest  was then closed with four pericostals of 00 chromic, #1 Vicryl in the muscle  layer, 2-0 Vicryl in the subcutaneous tissue and Ethicon skin clips.  The  patient received two blood transfusions, returned to the recovery room in  serious condition.                                               Ines Bloomer, M.D.    DPB/MEDQ  D:  02/24/2003  T:  02/24/2003  Job:  [id number]   cc:   Marcelyn Bruins, M.D. [facility]

## 2010-06-17 NOTE — Op Note (Signed)
[name], SCADDEN                           ACCOUNT NO.:  [id number]   MEDICAL RECORD NO.:  [id number]                   PATIENT TYPE:  INP   LOCATION:  2006                                 FACILITY:  [facility]   PHYSICIAN:  Abigail Miyamoto, M.D.              DATE OF BIRTH:  March 31, 1940   DATE OF PROCEDURE:  02/19/2003  DATE OF DISCHARGE:                                 OPERATIVE REPORT   PREOPERATIVE DIAGNOSIS:  Biliary dyskinesia.   POSTOPERATIVE DIAGNOSIS:  Biliary dyskinesia.   PROCEDURE:  Laparoscopic cholecystectomy with interoperative cholangiogram.   SURGEON:  Douglas A. Magnus Ivan, M.D.   ANESTHESIA:  General endotracheal anesthesia.   ESTIMATED BLOOD LOSS:  Minimal.   FINDINGS:  The patient was found to have a normal cholangiogram.  The  gallbladder appeared to be chronically scarred and consistent with chronic  cholecystitis.   PROCEDURE IN DETAIL:  The patient was brought to the operating room and  identified as [name].  He was placed supine on the operating table,  general anesthesia was induced.  His abdomen was then prepped and draped in  the usual sterile fashion.  Using a 15 blade, a small transverse incision  was made below the umbilicus.  The incision was carried down through the  fascia which was opened with the scalpel.  A hemostat was then used to pass  through the peritoneal cavity.  A 0 Vicryl purse-string suture was then  placed around the fascial opening.  A Hasson port was placed through the  opening and insufflation of the abdomen was begun.  An 12 mm port was placed  in the patient's epigastrium and two 5 mm ports were placed in the patient's  right flank under direct vision.   The gallbladder was identified and retracted above the liver bed.  The  patient was noted to have multiple dense adhesions to the gallbladder which  were taken down with the electrocautery.  The gallbladder was found to be  chronically inflamed.  The cystic arteries  were found to be anterior and was  clipped twice proximally and once distally and transected.  The cystic duct  was then identified, was clipped once distally and then partly transected  with the laparoscopic scissors.  An Angiocath was inserted under direct  vision in the right upper quadrant.  A Cholangiocath was then placed through  the Angiocath and manipulated into the cystic duct.  Next, a cholangiogram  was performed under fluoroscopy. Good low of contrast was seen into the  entire biliary system and duodenum without evidence of abnormality.  The  Cholangiocath was removed and the cystic duct was clipped three times  proximally and then transected.  The posterior cystic branch of the artery  was then identified, clipped twice and transected, as well.  The gallbladder  was then slowly dissected free from the liver bed with the electrocautery.  The gallbladder was quite friable.  Once it was excised from the liver bed,  it was placed in an endosarc.  The liver bed was examined and hemostasis was  achieved with the cautery. The gallbladder was then removed through the  incision at the umbilicus.  The 0 Vicryl in the umbilicus was tied to close  the fascial defect.  The abdomen was again examined and hemostasis felt to  be achieved.  The abdomen was then irrigated with normal saline.  All ports  were removed under direct vision and the abdomen was deflated.  All  incisions were anesthetized with 0.25% Marcaine  and closed with 4-0 Vicryl subcuticular sutures.  Steri-Strips, gauze, and  tape was applied.  The patient tolerated the procedure well.  All sponge,  needle and instrument counts were correct at the end of the procedure.  The  patient was then extubated in the operating room and taken in stable  condition to the recovery room.                                               Abigail Miyamoto, M.D.    DB/MEDQ  D:  02/19/2003  T:  02/19/2003  Job:  [id number]

## 2010-06-17 NOTE — H&P (Signed)
[name], [name]                           ACCOUNT NO.:  [id number]   MEDICAL RECORD NO.:  [id number]                   PATIENT TYPE:  EMS   LOCATION:  MINO                                 FACILITY:  [facility]   PHYSICIAN:  Sean A. Everardo All, M.D. [facility]           DATE OF BIRTH:  December 21, 1940   DATE OF ADMISSION:  12/28/2002  DATE OF DISCHARGE:                                HISTORY & PHYSICAL   REASON FOR ADMISSION:  Intractable vomiting.   HISTORY OF PRESENT ILLNESS:  The patient is a 70 year old man who saw Dr.  Jonny Ruiz last week to follow up an abnormal chest x-ray and fever.  He was  treated with Levaquin.  His fever is better, but he has about two weeks of  severe nausea and vomiting with associated slight shortness of breath.  He  does not have any pain on the right side of his chest.   PAST MEDICAL HISTORY:  1. He has been followed for this abnormal chest x-ray for about six weeks.  2. Type 2 diabetes.  3. Hypertension.  4. Dyslipidemia.   MEDICATIONS:  1. Levaquin 500 mg a day.  2. Actos 45 mg a day.  3. Lipitor 10 mg a day.  4. Diovan 160 mg a day.  5. Glucophage 1000 mg twice a day.  6. Glargine 60 units daily.   SOCIAL HISTORY:  The patient is married, he is retired, his wife is here.   FAMILY HISTORY:  No one else is at home.   REVIEW OF SYSTEMS:  He denies the following:  Sore throat, ear pain, nasal  congestion, cough, abdominal pain, chest pain, rectal bleeding, hematuria,  loss of consciousness, or any change in his weight.   PHYSICAL EXAMINATION:  VITAL SIGNS:  Blood pressure 146/72, heart rate is  79, respiratory rate is 18, temperature is 98.2.  GENERAL:  No distress.  SKIN:  Not diaphoretic.  HEENT:  Head is atraumatic.  Sclerae nonicteric.  Pharynx clear.  NECK:  Supple.  CHEST:  Clear to auscultation except the patient has consolidative changes  in the upper 2/3 of the right lung field and decreased breath sounds in the  lower 1/3 of the right lung field.   The left side of the chest is completely  normal to auscultation.  CARDIOVASCULAR:  No JVD.  There is trace bilateral pretibial edema.  Regular  rate and rhythm, no murmur.  Pedal pulses are intact.  ABDOMEN:  Soft, obese, nontender, no hepatosplenomegaly, no mass.  GENITALIA:  Not done at this time due to the patient's condition.  RECTAL:  Not done at this time due to the patient's condition.  EXTREMITIES:  On the feet, there is no ulcer present and the feet are of  normal color and temperature.  NEUROLOGIC:  Alert, well oriented, cranial nerves appear to be intact.  The  patient readily moves all four's and sensation is intact to touch  in the  feet.   LABORATORY DATA:  Chest x-ray shows a right pleural effusion and above that,  an infiltrate versus atelectasis.  Glucose 215.  Remainder of BMET normal.  WBC 15,700, platelets 752.   IMPRESSION:  1. Intractable vomiting of uncertain etiology.  It is unclear if it is     related to the below.  2. History of abnormal chest x-ray, which in the context of the fever may be     pneumonia with an associated effusion.  3. Edema which is probably due to a combination of the Actos and the     Glargine.  4. The Glucophage may be exacerbating the nausea.  5. Other chronic medical problems as noted above.   PLAN:  1. A small amount of intravenous fluid.  2. Clear liquid diet.  3. Symptomatic therapy.  4. Decreased insulin for safety reason.  5. Change Levaquin to intravenous Tequin.  6. Check other laboratory studies:  Amylase, liver function tests, and TSH.  7. Hold Glucophage.  8. I discussed code status with the patient, he requests full code.                                                Sean A. Everardo All, M.D. [facility]    SAE/MEDQ  D:  12/28/2002  T:  12/28/2002  Job:  [id number]

## 2010-06-28 ENCOUNTER — Other Ambulatory Visit: Payer: Self-pay | Admitting: Endocrinology

## 2010-06-30 ENCOUNTER — Telehealth: Payer: Self-pay | Admitting: *Deleted

## 2010-06-30 NOTE — Telephone Encounter (Signed)
PA request for Testim Gel from [employer] forwarded by Dr George Hugh [facility] [06/29/10 @ 5:00p] PA form completed and faxed for Approval [06/30/10]

## 2010-07-01 NOTE — Telephone Encounter (Signed)
Approval recvd & faxed to pharmacy.

## 2010-08-08 ENCOUNTER — Other Ambulatory Visit: Payer: Self-pay | Admitting: Endocrinology

## 2010-08-26 ENCOUNTER — Telehealth: Payer: Self-pay | Admitting: *Deleted

## 2010-08-26 DIAGNOSIS — E291 Testicular hypofunction: Secondary | ICD-10-CM

## 2010-08-26 DIAGNOSIS — E1065 Type 1 diabetes mellitus with hyperglycemia: Secondary | ICD-10-CM

## 2010-08-26 NOTE — Telephone Encounter (Signed)
Pt has OV on 07/31 and wants to come in for labs prior to OV. Dx codes please?

## 2010-08-26 NOTE — Telephone Encounter (Signed)
i ordered

## 2010-08-26 NOTE — Telephone Encounter (Signed)
Per MD, pt is due for OV. Appointment scheduled 08/30/2010 at 9:45am.

## 2010-08-30 ENCOUNTER — Encounter: Payer: Self-pay | Admitting: Endocrinology

## 2010-08-30 ENCOUNTER — Other Ambulatory Visit (INDEPENDENT_AMBULATORY_CARE_PROVIDER_SITE_OTHER): Payer: Medicare Other

## 2010-08-30 ENCOUNTER — Ambulatory Visit (INDEPENDENT_AMBULATORY_CARE_PROVIDER_SITE_OTHER): Payer: Medicare Other | Admitting: Endocrinology

## 2010-08-30 DIAGNOSIS — E1065 Type 1 diabetes mellitus with hyperglycemia: Secondary | ICD-10-CM

## 2010-08-30 DIAGNOSIS — E291 Testicular hypofunction: Secondary | ICD-10-CM

## 2010-08-30 NOTE — Progress Notes (Signed)
Subjective:    Patient ID: [name], male    DOB: 1940/02/02, 70 y.o.   MRN: [id number]  HPI Pt states few years of moderate swelling of the legs, but no assoc sob. He had to reduce testim due to acne.  He missed his dose yesterday.   no cbg record, but states cbg was low early yestreday am (65).  He had a cbg of 55 at 4 am.  He says cbg is also highest in am.   Past Medical History  Diagnosis Date  . Arthritis   . Hyperlipidemia   . Multinodular goiter   . Sleep apnea   . NASH (nonalcoholic steatohepatitis)   . DM retinopathy   . ED (erectile dysfunction)   . Colitis 1997    nonspecific  . Anemia, unspecified   . Diabetes mellitus     Type I  . DM peripheral angiopathy     Past Surgical History  Procedure Date  . Appendectomy   . Coronary artery bypass graft 2007    x 1  . Thoracotomy 2005    S/P Right Throactomy for Empyema  . Esophagogastroduodenoscopy 02/04/2003    normal    History   Social History  . Marital Status: Married    Spouse Name: N/A    Number of Children: N/A  . Years of Education: N/A   Occupational History  . Retired     laid off in 2010   Social History Main Topics  . Smoking status: Former Games developer  . Smokeless tobacco: Not on file  . Alcohol Use: Yes     rare  . Drug Use: No  . Sexually Active:    Other Topics Concern  . Not on file   Social History Narrative   Regular exercise-swims once dayDaily Caffeine Use: 1-2 cups per day    Current Outpatient Prescriptions on File Prior to Visit  Medication Sig Dispense Refill  . allopurinol (ZYLOPRIM) 300 MG tablet Take 300 mg by mouth daily.        Marland Kitchen aspirin (ASPIRIN ADULT LOW STRENGTH) 81 MG EC tablet Take 81 mg by mouth 4 (four) times daily. 1 tablet by mouth four times daily       . B-D ULTRAFINE III SHORT PEN 31G X 8 MM MISC USE TO INJECT INSULIN THREE TIMES A DAY  200 each  3  . Cholecalciferol (VITAMIN D) 400 UNIT capsule Take 400 Units by mouth daily.        . colchicine  (COLCRYS) 0.6 MG tablet Take 0.6 mg by mouth every hour as needed. Not to exceed 6 per day       . furosemide (LASIX) 80 MG tablet Take 80 mg by mouth daily.        Marland Kitchen HUMALOG KWIKPEN 100 UNIT/ML injection inject 45 units before BREAKFAST 40 units before LUNCH and 85 units before SUPPER  90 mL  3  . insulin NPH (HUMULIN N PEN) 100 UNIT/ML injection Inject 130 Units into the skin at bedtime.        . Insulin Pen Needle (BD ULTRA-FINE PEN NEEDLES) 29G X 12.7MM MISC Use as directed       . Insulin Syringe-Needle U-100 (B-D INS SYRINGE 0.5CC/30GX1/2") 30G X 1/2" 0.5 ML MISC by Does not apply route. Use as directed       . Multiple Vitamin (MULTIVITAMIN) tablet Take 1 tablet by mouth daily.        . Omega-3 Fatty Acids (SALMON OIL-1000) 200 MG CAPS  Take 1 capsule by mouth daily.        . simvastatin (ZOCOR) 80 MG tablet Take 40 mg by mouth daily. 1/2 tablet by mouth once daily       . testosterone (TESTIM) 50 MG/5GM GEL Place 10 g onto the skin daily.          No Known Allergies  Family History  Problem Relation Age of Onset  . Crohn's disease Father   . Cancer Neg Hx     BP 132/78  Pulse 60  Temp(Src) 98.2 F (36.8 C) (Oral)  Ht 6\' 4"  (1.93 m)  Wt 383 lb 3.2 oz (173.818 kg)  BMI 46.64 kg/m2  SpO2 92%    Review of Systems Denies decreased urination and weight change    Objective:   Physical Exam Pulses:  dorsalis pedis intact bilat, but decreased from normal (prob due to edema) Extremities:  no deformity.  no open ulcer on the feet, but there are healed ulcers at both legs.  feet are otherwise of normal color and temp.   ankles are only slightly tender 2+ right pedal edema, 2+ left pedal edema, and mycotic toenails.   Neurologic:  sensation is intact to touch on the feet, but decreased from normal.      Lab Results  Component Value Date   TESTOSTERONE 558.68 08/30/2010   Lab Results  Component Value Date   HGBA1C 8.3* 08/30/2010   Assessment & Plan:  Dm, needs increased  rx Hypogonadism, well-controlled on testim 10 g qd Acne, due to testim, better on reduced dosage. Edema, persistent

## 2010-08-30 NOTE — Patient Instructions (Addendum)
please call [phone] to hear your test results.  You will be prompted to enter the 9-digit "MRN" number that appears at the top left of this page, followed by #.  Then you will hear the message. pending the test results, please reduce nph insulin to 120 units at bedtime.  Based on the results, i may advise increasing the humalog.   Please make a regular physical appointment in 3 months.  (update: i left message on phone-tree:  Increase humalog to tid (qac) 55-50-95 units.  Take only 10 g qd of testim).

## 2010-09-20 ENCOUNTER — Ambulatory Visit (INDEPENDENT_AMBULATORY_CARE_PROVIDER_SITE_OTHER): Payer: Medicare Other | Admitting: Endocrinology

## 2010-09-20 ENCOUNTER — Encounter: Payer: Self-pay | Admitting: Endocrinology

## 2010-09-20 VITALS — BP 138/64 | HR 79 | Temp 97.6°F | Ht 76.0 in | Wt 364.0 lb

## 2010-09-20 DIAGNOSIS — M6530 Trigger finger, unspecified finger: Secondary | ICD-10-CM | POA: Insufficient documentation

## 2010-09-20 MED ORDER — HYDROCODONE-ACETAMINOPHEN 5-325 MG PO TABS: 1.0000 | ORAL_TABLET | ORAL | Status: DC | PRN

## 2010-09-20 MED ORDER — METAXALONE 800 MG PO TABS: 800.0000 mg | ORAL_TABLET | Freq: Three times a day (TID) | ORAL | Status: DC

## 2010-09-20 NOTE — Progress Notes (Signed)
Subjective:    Patient ID: [name], male    DOB: Mar 12, 1940, 70 y.o.   MRN: [id number]  HPI Pt was hospitalized at [facility] a few weeks ago, for right flank pain.  He says w/u was negative, and sxs are resolved.   Does to weight loss, he has had to reduce his humalog to approx 30-40 units tid (qac), and the nph to 60 units qhs.  he brings a record of his cbg's which i have reviewed today.  It varies from 50's (with exertion) to 100's. Pt states 1 month of moderate "knuckes locking up," of both thumbs, and assoc pain.   He was rx'ed "miralax" at hosp, but he says he no longer needs it. Past Medical History  Diagnosis Date  . Arthritis   . Hyperlipidemia   . Multinodular goiter   . Sleep apnea   . NASH (nonalcoholic steatohepatitis)   . DM retinopathy   . ED (erectile dysfunction)   . Colitis 1997    nonspecific  . Anemia, unspecified   . Diabetes mellitus     Type I  . DM peripheral angiopathy     Past Surgical History  Procedure Date  . Appendectomy   . Coronary artery bypass graft 2007    x 1  . Thoracotomy 2005    S/P Right Throactomy for Empyema  . Esophagogastroduodenoscopy 02/04/2003    normal    History   Social History  . Marital Status: Married    Spouse Name: N/A    Number of Children: N/A  . Years of Education: N/A   Occupational History  . Retired     laid off in 2010   Social History Main Topics  . Smoking status: Former Games developer  . Smokeless tobacco: Not on file  . Alcohol Use: Yes     rare  . Drug Use: No  . Sexually Active:    Other Topics Concern  . Not on file   Social History Narrative   Regular exercise-swims once dayDaily Caffeine Use: 1-2 cups per day    Current Outpatient Prescriptions on File Prior to Visit  Medication Sig Dispense Refill  . allopurinol (ZYLOPRIM) 300 MG tablet Take 300 mg by mouth daily.        Marland Kitchen aspirin (ASPIRIN ADULT LOW STRENGTH) 81 MG EC tablet Take 81 mg by mouth once.       .  B-D ULTRAFINE III SHORT PEN 31G X 8 MM MISC USE TO INJECT INSULIN THREE TIMES A DAY  200 each  3  . colchicine (COLCRYS) 0.6 MG tablet Take 0.6 mg by mouth every hour as needed. Not to exceed 6 per day       . furosemide (LASIX) 80 MG tablet Take 40 mg by mouth daily.       . insulin lispro (HUMALOG KWIKPEN) 100 UNIT/ML injection Inject 30 Units into the skin 3 (three) times daily before meals.       . insulin NPH (HUMULIN N PEN) 100 UNIT/ML injection Inject 60 Units into the skin at bedtime.       . Insulin Pen Needle (BD ULTRA-FINE PEN NEEDLES) 29G X 12.7MM MISC Use as directed       . Multiple Vitamin (MULTIVITAMIN) tablet Take 1 tablet by mouth daily.        . simvastatin (ZOCOR) 80 MG tablet Take 40 mg by mouth daily. 1/2 tablet by mouth once daily       . testosterone (TESTIM) 50  MG/5GM GEL Place 10 g onto the skin daily.         No Known Allergies  Family History  Problem Relation Age of Onset  . Crohn's disease Father   . Cancer Neg Hx    BP 138/64  Pulse 79  Temp(Src) 97.6 F (36.4 C) (Oral)  Ht 6\' 4"  (1.93 m)  Wt 364 lb (165.109 kg)  BMI 44.31 kg/m2  SpO2 94%  Review of Systems He denies loc and diarrhea.    Objective:   Physical Exam GENERAL: no distress.  Obese Hands: there are "trigger fingers" of both thumb ip joints.     Assessment & Plan:  Flank pain and n/v, uncertain etiology.  Resolved bilat thumb trigger fingers, new Dm.  Insulin requirement will change as he weight stabilizes Constip, resolved.

## 2010-09-20 NOTE — Patient Instructions (Addendum)
Please sign release of information for your stay in the [facility] hospital. You can go-off the "miralax" powder.   Please make a regular physical appointment in 2 months.  take humalog 30 units 3x a day (just before each meal).  If you are going to be active, take only 15 units.  Take nph, 60 units at bedtime. check your blood sugar 2 times a day.  vary the time of day when you check, between before the 3 meals, and at bedtime.  also check if you have symptoms of your blood sugar being too high or too low.  please keep a record of the readings and bring it to your next appointment here.  please call us sooner if you are having low blood sugar episodes.   refer to a hand specialist.  you will receive a phone call, about a day and time for an appointment.  Here is a refill of your pain mediation.

## 2010-10-06 IMAGING — CR DG CHEST 2V
2 series · 2 of 2 positions shown · non-contrast
Comparison: 10/12/2006

CLINICAL DATA: Shortness of breath, cough and fever.

CHEST - 2 VIEW

[view not recorded (1 of 2)]
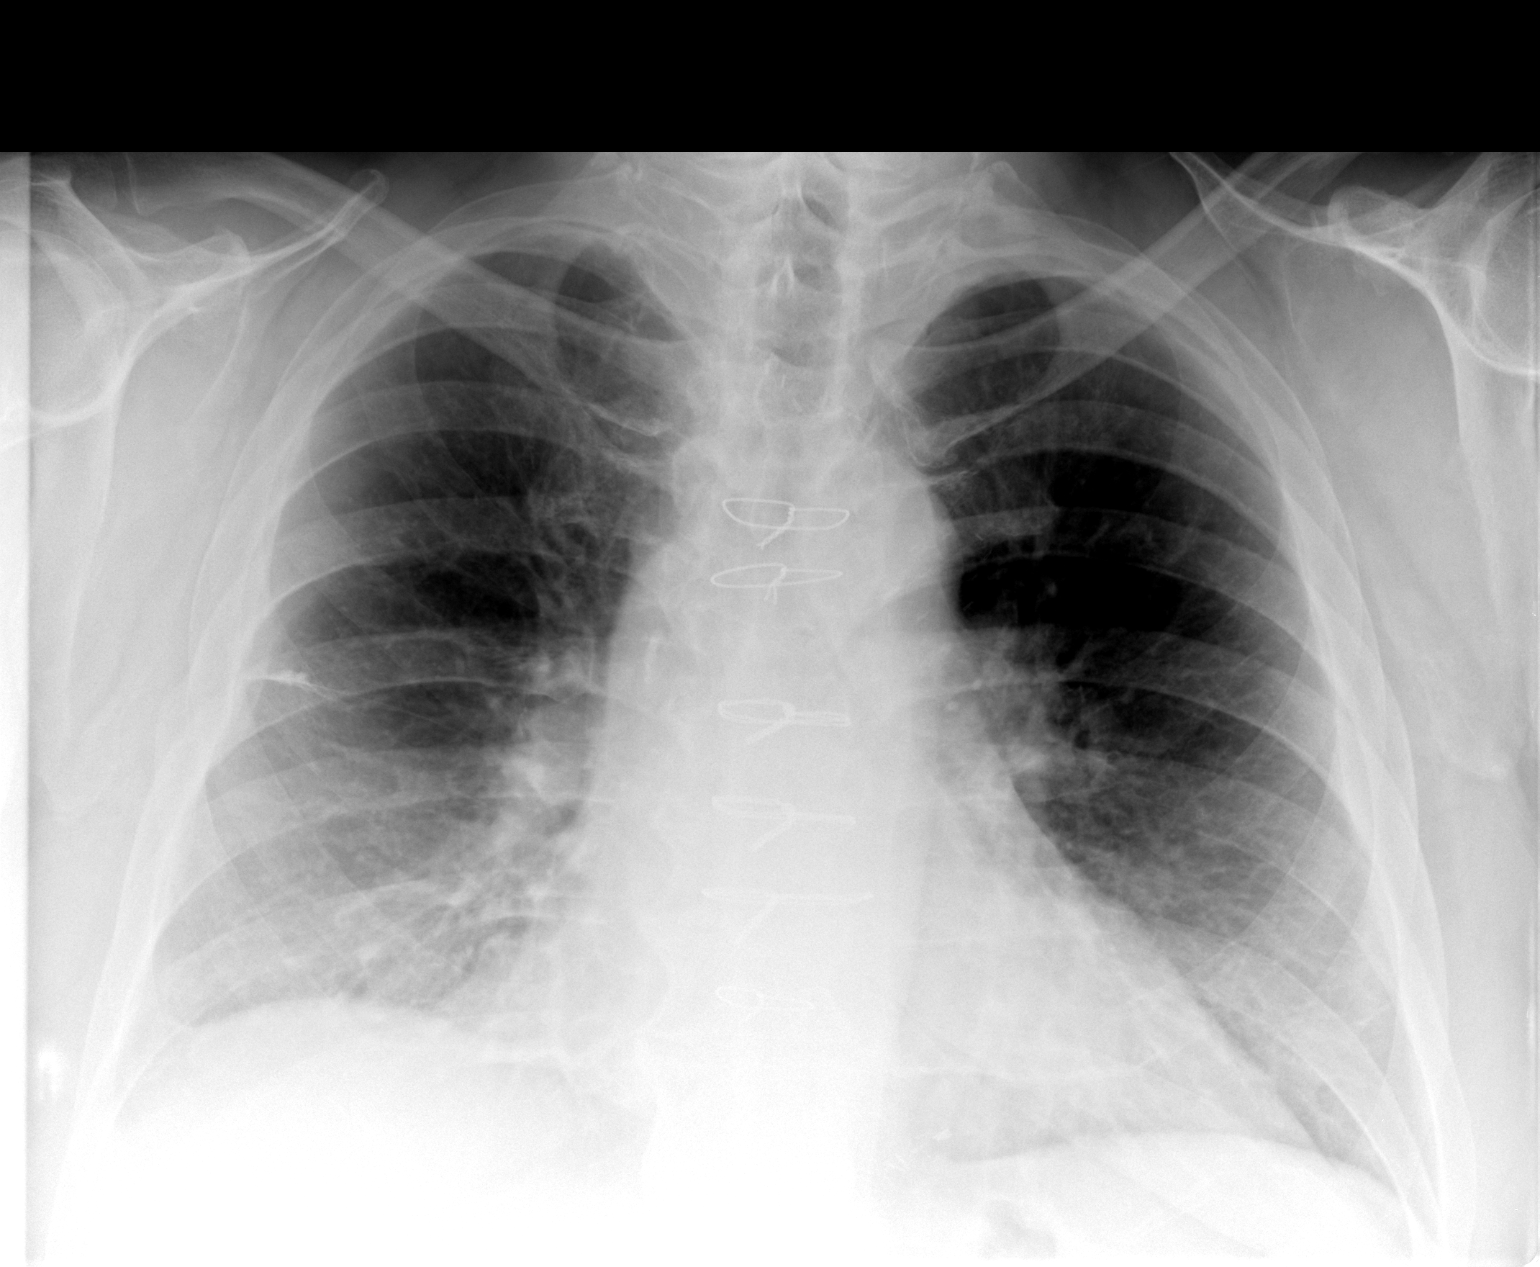

[view not recorded (2 of 2)]
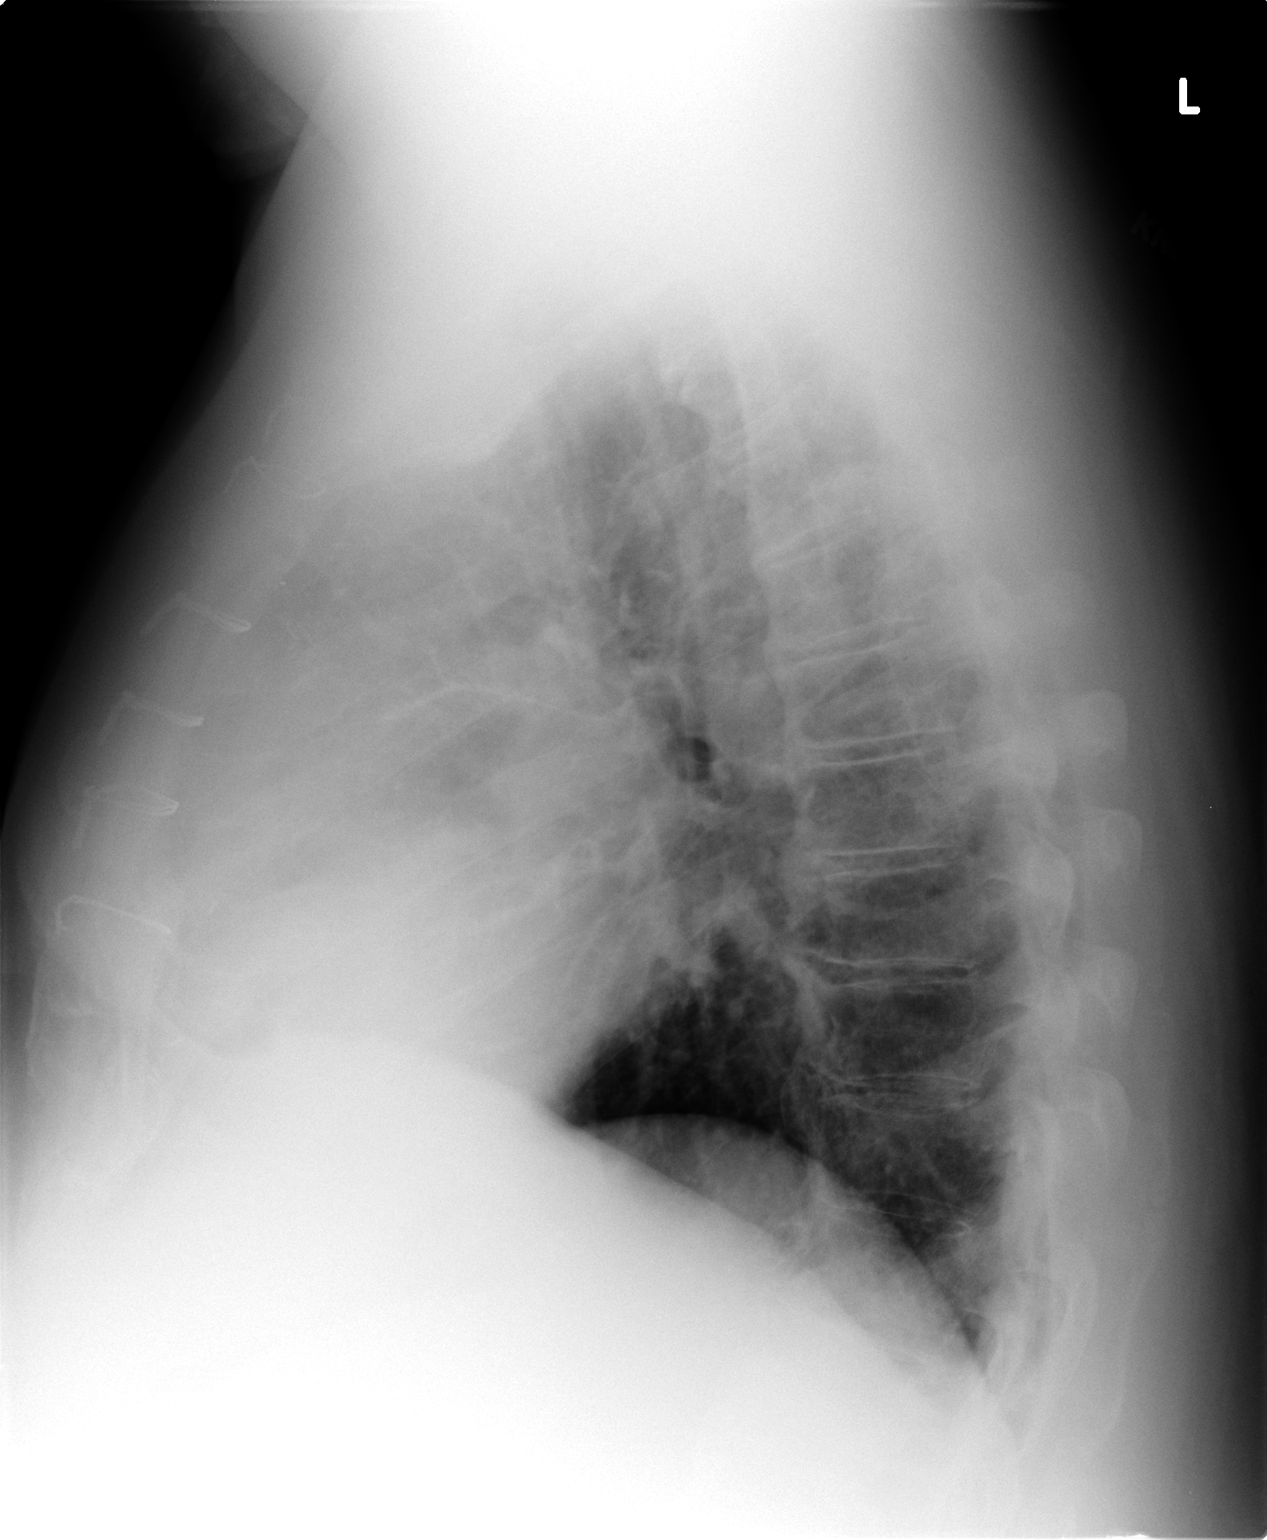

[2 of 2 positions shown; findings below may reference images not displayed]

FINDINGS: Trachea is midline.  Heart is enlarged, stable.  Pleural
parenchymal scarring is seen along the minor fissure, adjacent to
old rib fractures.  There may be mild bibasilar opacities.  No
pleural fluid.
IMPRESSION: Bibasilar opacities may represent mild airspace disease,
atelectasis or scarring.

## 2010-10-10 ENCOUNTER — Other Ambulatory Visit: Payer: Self-pay | Admitting: Endocrinology

## 2010-10-10 NOTE — Telephone Encounter (Signed)
i printed 

## 2010-10-10 NOTE — Telephone Encounter (Signed)
Rx faxed to [facility] pharmacy.  

## 2010-10-10 NOTE — Telephone Encounter (Signed)
Please advise-last written 04/04/2010 #90 with 5 refills

## 2010-10-18 ENCOUNTER — Telehealth: Payer: Self-pay | Admitting: *Deleted

## 2010-10-18 NOTE — Telephone Encounter (Signed)
It was reduced to 10 mg qd.

## 2010-10-18 NOTE — Telephone Encounter (Signed)
[facility] pharmacy called regarding pt's Testim rx. Rx is for 10 grams topically daily. Pt states that he previously was using 15 grams topically daily-please advise

## 2010-10-19 NOTE — Telephone Encounter (Signed)
Pharmacist at [facility] informed.

## 2010-11-04 LAB — COMPREHENSIVE METABOLIC PANEL WITH GFR
ALT: 24
AST: 23
Albumin: 3.5
Alkaline Phosphatase: 55
BUN: 22
CO2: 24
Calcium: 8.8
Chloride: 103
Creatinine, Ser: 1.19
GFR calc non Af Amer: >60
Glucose, Bld: 249 — ABNORMAL HIGH
Potassium: 4.4
Sodium: 134 — ABNORMAL LOW
Total Bilirubin: 1.1
Total Protein: 7.2

## 2010-11-04 LAB — BASIC METABOLIC PANEL
BUN: 22
BUN: 29 — ABNORMAL HIGH
Calcium: 7.6 — ABNORMAL LOW
Creatinine, Ser: 1.66 — ABNORMAL HIGH
GFR calc Af Amer: >60
GFR calc non Af Amer: 42 — ABNORMAL LOW
GFR calc non Af Amer: 50 — ABNORMAL LOW
Glucose, Bld: 203 — ABNORMAL HIGH
Potassium: 4.1
Sodium: 136

## 2010-11-04 LAB — CULTURE, BLOOD (ROUTINE X 2): Culture: NO GROWTH

## 2010-11-04 LAB — CBC
HCT: 33.8 — ABNORMAL LOW
HCT: 34.3 — ABNORMAL LOW
MCHC: 33.2
MCV: 88.9
Platelets: 222
Platelets: 249
Platelets: 252
RDW: 15.1
RDW: 15.3
WBC: 6.9

## 2010-11-04 LAB — TSH: TSH: 0.805

## 2010-11-04 LAB — DIFFERENTIAL
Eosinophils Relative: 7 — ABNORMAL HIGH
Lymphs Abs: 1.1
Monocytes Relative: 25 — ABNORMAL HIGH

## 2010-11-04 LAB — URINE CULTURE

## 2010-11-11 LAB — BASIC METABOLIC PANEL
BUN: 24 — ABNORMAL HIGH
Calcium: 7.9 — ABNORMAL LOW
Calcium: 8.4
Chloride: 107
Creatinine, Ser: 1.14
Creatinine, Ser: 1.17
GFR calc Af Amer: >60
GFR calc Af Amer: >60
GFR calc non Af Amer: >60
GFR calc non Af Amer: >60
Potassium: 3.9
Potassium: 4
Sodium: 135
Sodium: 140

## 2010-11-11 LAB — POCT I-STAT 3, ART BLOOD GAS (G3+)
Bicarbonate: 19.1 — ABNORMAL LOW
Bicarbonate: 24.6 — ABNORMAL HIGH
Operator id: 113031
Patient temperature: 36.7
TCO2: 20
pCO2 arterial: 34.2 — ABNORMAL LOW
pH, Arterial: 7.348 — ABNORMAL LOW
pH, Arterial: 7.358
pO2, Arterial: 82

## 2010-11-11 LAB — CK TOTAL AND CKMB (NOT AT ARMC)
CK, MB: 21.7 — ABNORMAL HIGH
Relative Index: 0.9
Total CK: 2380 — ABNORMAL HIGH

## 2010-11-11 LAB — POCT I-STAT 4, (NA,K, GLUC, HGB,HCT)
Glucose, Bld: 116 — ABNORMAL HIGH
Glucose, Bld: 142 — ABNORMAL HIGH
Glucose, Bld: 163 — ABNORMAL HIGH
HCT: 33 — ABNORMAL LOW
HCT: 33 — ABNORMAL LOW
Hemoglobin: 11.2 — ABNORMAL LOW
Hemoglobin: 11.2 — ABNORMAL LOW
Operator id: 113031
Operator id: 3402
Potassium: 3.9
Potassium: 4.1
Potassium: 5

## 2010-11-11 LAB — I-STAT EC8
Acid-base deficit: 2
Bicarbonate: 23.5
Glucose, Bld: 135 — ABNORMAL HIGH
Potassium: 5.1
TCO2: 25
pCO2 arterial: 44
pH, Arterial: 7.335 — ABNORMAL LOW

## 2010-11-11 LAB — CBC
HCT: 31.3 — ABNORMAL LOW
HCT: 32.5 — ABNORMAL LOW
HCT: 32.8 — ABNORMAL LOW
HCT: 33.6 — ABNORMAL LOW
Hemoglobin: 10.6 — ABNORMAL LOW
Hemoglobin: 10.6 — ABNORMAL LOW
Hemoglobin: 11.3 — ABNORMAL LOW
MCHC: 34.3
MCV: 89.2
MCV: 91.9
Platelets: 391
Platelets: 394
RBC: 3.41 — ABNORMAL LOW
RBC: 3.45 — ABNORMAL LOW
RBC: 3.68 — ABNORMAL LOW
WBC: 10.1
WBC: 10.6 — ABNORMAL HIGH
WBC: 11.7 — ABNORMAL HIGH
WBC: 9.4

## 2010-11-11 LAB — CREATININE, SERUM
Creatinine, Ser: 1.21
GFR calc non Af Amer: >60

## 2010-11-11 LAB — MAGNESIUM: Magnesium: 1.9

## 2010-11-11 LAB — POCT I-STAT GLUCOSE: Operator id: 3402

## 2010-11-14 LAB — APTT: aPTT: 33

## 2010-11-14 LAB — TYPE AND SCREEN: ABO/RH(D): O POS

## 2010-11-14 LAB — BLOOD GAS, ARTERIAL
Bicarbonate: 25.3 — ABNORMAL HIGH
Drawn by: 274481
FIO2: 0.21
O2 Saturation: 95.6
pCO2 arterial: 41.9
pH, Arterial: 7.398
pO2, Arterial: 78.4 — ABNORMAL LOW

## 2010-11-14 LAB — BASIC METABOLIC PANEL
BUN: 27 — ABNORMAL HIGH
CO2: 24
CO2: 26
Chloride: 107
Creatinine, Ser: 1
GFR calc Af Amer: >60
GFR calc non Af Amer: >60
Glucose, Bld: 202 — ABNORMAL HIGH
Potassium: 4
Potassium: 4.3

## 2010-11-14 LAB — POCT I-STAT 3, ART BLOOD GAS (G3+)
Bicarbonate: 25.9 — ABNORMAL HIGH
TCO2: 27
pH, Arterial: 7.365
pO2, Arterial: 67 — ABNORMAL LOW

## 2010-11-14 LAB — URINALYSIS, ROUTINE W REFLEX MICROSCOPIC
Nitrite: NEGATIVE
Specific Gravity, Urine: 1.016
Urobilinogen, UA: 0.2
pH: 5

## 2010-11-14 LAB — COMPREHENSIVE METABOLIC PANEL
ALT: 53
AST: 41 — ABNORMAL HIGH
Alkaline Phosphatase: 51
CO2: 25
Calcium: 9.4
GFR calc Af Amer: >60
GFR calc non Af Amer: >60
Glucose, Bld: 156 — ABNORMAL HIGH
Potassium: 4.3
Sodium: 134 — ABNORMAL LOW
Total Protein: 7.3

## 2010-11-14 LAB — POCT I-STAT 3, VENOUS BLOOD GAS (G3P V)
Acid-base deficit: 3 — ABNORMAL HIGH
Bicarbonate: 23.1
O2 Saturation: 66
pCO2, Ven: 47
pO2, Ven: 38

## 2010-11-14 LAB — CBC
HCT: 35.6 — ABNORMAL LOW
Hemoglobin: 12.2 — ABNORMAL LOW
Hemoglobin: 13.2
MCHC: 34.4
MCHC: 34.4
MCV: 89.5
RBC: 3.98 — ABNORMAL LOW
RBC: 4.32
WBC: 7.1
WBC: 8.8

## 2010-11-14 LAB — CK TOTAL AND CKMB (NOT AT ARMC): CK, MB: 1.8

## 2010-11-14 LAB — POCT I-STAT GLUCOSE: Operator id: 194801

## 2010-11-14 LAB — PROTIME-INR: INR: 1

## 2010-11-14 LAB — ABO/RH: ABO/RH(D): O POS

## 2010-11-14 LAB — HEMOGLOBIN A1C: Hgb A1c MFr Bld: 7.4 — ABNORMAL HIGH

## 2010-11-22 ENCOUNTER — Other Ambulatory Visit: Payer: Self-pay | Admitting: *Deleted

## 2010-11-22 MED ORDER — INSULIN NPH (HUMAN) (ISOPHANE) 100 UNIT/ML ~~LOC~~ SUSP: 60.0000 [IU] | Freq: Every day | SUBCUTANEOUS | Status: DC

## 2010-11-22 NOTE — Telephone Encounter (Signed)
R'cd fax from [facility] for refill of Humulin N  Last filed-09/28/2010  Last OV-08/2010

## 2010-12-19 ENCOUNTER — Other Ambulatory Visit: Payer: Self-pay | Admitting: *Deleted

## 2010-12-19 MED ORDER — FUROSEMIDE 80 MG PO TABS: 40.0000 mg | ORAL_TABLET | Freq: Every day | ORAL | Status: DC

## 2010-12-19 NOTE — Telephone Encounter (Signed)
R'cd fax from [facility] for refill of Simvastatin  Last OV-08/2010  Last filled-02/17/2010

## 2010-12-23 ENCOUNTER — Other Ambulatory Visit: Payer: Self-pay

## 2010-12-23 MED ORDER — SIMVASTATIN 80 MG PO TABS: 40.0000 mg | ORAL_TABLET | Freq: Every day | ORAL | Status: DC

## 2010-12-25 ENCOUNTER — Other Ambulatory Visit: Payer: Self-pay | Admitting: Endocrinology

## 2010-12-27 ENCOUNTER — Ambulatory Visit (INDEPENDENT_AMBULATORY_CARE_PROVIDER_SITE_OTHER): Payer: Medicare Other | Admitting: Endocrinology

## 2010-12-27 ENCOUNTER — Other Ambulatory Visit: Payer: Self-pay | Admitting: Endocrinology

## 2010-12-27 ENCOUNTER — Encounter: Payer: Self-pay | Admitting: Endocrinology

## 2010-12-27 ENCOUNTER — Other Ambulatory Visit (INDEPENDENT_AMBULATORY_CARE_PROVIDER_SITE_OTHER): Payer: Medicare Other

## 2010-12-27 DIAGNOSIS — E1065 Type 1 diabetes mellitus with hyperglycemia: Secondary | ICD-10-CM

## 2010-12-27 DIAGNOSIS — R197 Diarrhea, unspecified: Secondary | ICD-10-CM

## 2010-12-27 LAB — BASIC METABOLIC PANEL
Calcium: 8.9 mg/dL (ref 8.4–10.5)
GFR: 69.77 mL/min (ref >60.00)
Potassium: 3.8 mEq/L (ref 3.5–5.1)
Sodium: 142 mEq/L (ref 135–145)

## 2010-12-27 LAB — HEMOGLOBIN A1C: Hgb A1c MFr Bld: 8.9 % — ABNORMAL HIGH (ref 4.6–6.5)

## 2010-12-27 MED ORDER — CIPROFLOXACIN HCL 500 MG PO TABS: 500.0000 mg | ORAL_TABLET | Freq: Two times a day (BID) | ORAL | Status: AC

## 2010-12-27 NOTE — Progress Notes (Signed)
Subjective:    Patient ID: [name], male    DOB: 08/30/1940, 70 y.o.   MRN: [id number]  HPI Pt states 9 days of moderate watery-quality diarrhea, but no assoc abd cramps.  No recent abx or colchicine.   He has slight dizziness upon arising.  no cbg record, but states cbg's are well-controlled, except he had to increase the lunch insulin to 40 units.  Past Medical History  Diagnosis Date  . Arthritis   . Hyperlipidemia   . Multinodular goiter   . Sleep apnea   . NASH (nonalcoholic steatohepatitis)   . DM retinopathy   . ED (erectile dysfunction)   . Colitis 1997    nonspecific  . Anemia, unspecified   . Diabetes mellitus     Type I  . DM peripheral angiopathy     Past Surgical History  Procedure Date  . Appendectomy   . Coronary artery bypass graft 2007    x 1  . Thoracotomy 2005    S/P Right Throactomy for Empyema  . Esophagogastroduodenoscopy 02/04/2003    normal    History   Social History  . Marital Status: Married    Spouse Name: N/A    Number of Children: N/A  . Years of Education: N/A   Occupational History  . Retired     laid off in 2010   Social History Main Topics  . Smoking status: Former Games developer  . Smokeless tobacco: Not on file  . Alcohol Use: Yes     rare  . Drug Use: No  . Sexually Active:    Other Topics Concern  . Not on file   Social History Narrative   Regular exercise-swims once dayDaily Caffeine Use: 1-2 cups per day    Current Outpatient Prescriptions on File Prior to Visit  Medication Sig Dispense Refill  . allopurinol (ZYLOPRIM) 300 MG tablet Take 300 mg by mouth daily.        Marland Kitchen aspirin (ASPIRIN ADULT LOW STRENGTH) 81 MG EC tablet Take 81 mg by mouth once.       . B-D ULTRAFINE III SHORT PEN 31G X 8 MM MISC USE TO INJECT INSULIN THREE TIMES A DAY  200 each  3  . colchicine (COLCRYS) 0.6 MG tablet Take 0.6 mg by mouth every hour as needed. Not to exceed 6 per day       . furosemide (LASIX) 80 MG tablet Take 0.5 tablets  (40 mg total) by mouth daily.  45 tablet  2  . insulin NPH (HUMULIN N PEN) 100 UNIT/ML injection Inject 60 Units into the skin at bedtime.  20 mL  5  . Multiple Vitamin (MULTIVITAMIN) tablet Take 1 tablet by mouth daily.        . simvastatin (ZOCOR) 80 MG tablet Take 0.5 tablets (40 mg total) by mouth daily. 1/2 tablet by mouth once daily  90 tablet  1  . testosterone (TESTIM) 50 MG/5GM GEL Apply 10 g topically daily.  300 g  5  . HYDROcodone-acetaminophen (NORCO) 5-325 MG per tablet Take 1 tablet by mouth as needed. For pain  50 tablet  0  . metaxalone (SKELAXIN) 800 MG tablet Take 1 tablet (800 mg total) by mouth 3 (three) times daily.  90 tablet  5    No Known Allergies  Family History  Problem Relation Age of Onset  . Crohn's disease Father   . Cancer Neg Hx    BP 136/78  Pulse 80  Temp(Src) 97.6 F (36.4  C) (Oral)  Ht 6\' 4"  (1.93 m)  Wt 365 lb (165.563 kg)  BMI 44.43 kg/m2  SpO2 94%  Review of Systems Denies brbpr/n/v/hypoglycemia.  He has lost weight, due to his efforts.    Objective:   Physical Exam VITAL SIGNS:  See vs page GENERAL: no distress.  Obese ABDOMEN: abdomen is soft, nontender.  no hepatosplenomegaly.   not distended.  no hernia Ext: trace bilat leg edema  Lab Results  Component Value Date   HGBA1C 8.9* 12/27/2010      Assessment & Plan:  Gastroenteritis, new DM, needs increased rx Edema/chf.  Lasix could cause dizziness in this situation

## 2010-12-27 NOTE — Patient Instructions (Addendum)
blood tests are being requested for you today.  please call [phone] to hear your test results.  You will be prompted to enter the 9-digit "MRN" number that appears at the top left of this page, followed by #.  Then you will hear the message.  Skip furosemide until the diarrhea is resolved.   i have sent a prescription to your pharmacy, for an antibiotic, on a trial basis.   I hope you feel better soon.  If you don't feel better by next week, please call your doctor. Please come back for a "medicare wellness" appointment in 4 months. (update: i left message on phone-tree:  Increase insulin to these dosages).

## 2010-12-28 ENCOUNTER — Other Ambulatory Visit: Payer: Self-pay | Admitting: *Deleted

## 2010-12-28 MED ORDER — ALLOPURINOL 300 MG PO TABS: 300.0000 mg | ORAL_TABLET | Freq: Every day | ORAL | Status: DC

## 2010-12-28 NOTE — Telephone Encounter (Signed)
R'cd fax from [facility] for refill of Allopurinol  Last OV-12/27/2010  Last filled-10/13/2010

## 2011-01-05 ENCOUNTER — Telehealth: Payer: Self-pay | Admitting: *Deleted

## 2011-01-05 NOTE — Telephone Encounter (Signed)
From those sxs, i would say to give it more time to get better.

## 2011-01-05 NOTE — Telephone Encounter (Signed)
Called pt, no answer, unable to leave message.

## 2011-01-05 NOTE — Telephone Encounter (Signed)
Pt states md rx antibiotic last week was told to call back to let him know how he was doing. Pt states he doing much better but still having intermitting diarrhea, but do have semi form stool now. Want to know should he take another round of antibiotic....01/05/11@4 :44pm/LMB

## 2011-01-06 NOTE — Telephone Encounter (Signed)
Pt informed of MD's advisement. 

## 2011-01-20 ENCOUNTER — Other Ambulatory Visit: Payer: Self-pay | Admitting: Endocrinology

## 2011-07-04 ENCOUNTER — Telehealth: Payer: Self-pay

## 2011-07-04 ENCOUNTER — Other Ambulatory Visit: Payer: Self-pay | Admitting: Endocrinology

## 2011-07-04 MED ORDER — INSULIN NPH (HUMAN) (ISOPHANE) 100 UNIT/ML ~~LOC~~ SUSP: 90.0000 [IU] | Freq: Every day | SUBCUTANEOUS | Status: DC

## 2011-07-04 MED ORDER — INSULIN LISPRO 100 UNIT/ML ~~LOC~~ SOLN: 50.0000 [IU] | Freq: Three times a day (TID) | SUBCUTANEOUS | Status: DC

## 2011-07-04 NOTE — Telephone Encounter (Signed)
Rx sent to [facility] in [place] for increased insulin dosages, pt's spouse informed and also informed of MD's advisement to scheduled appointment. Pt's spouse states that is currently in [place] for a family matter and will have him schedule appointment when he returns.

## 2011-07-04 NOTE — Telephone Encounter (Signed)
Pt's spouse called stating that after OV with [facility]E in 12/2010, pt was advised to increase Humulin N from 60 units to 90 units at bedtime and Humalog from 45 units TID to 50 units TID. Please advise on this increases.

## 2011-07-04 NOTE — Telephone Encounter (Signed)
Please increase Ov is due

## 2011-07-17 ENCOUNTER — Other Ambulatory Visit: Payer: Self-pay | Admitting: *Deleted

## 2011-07-17 MED ORDER — FUROSEMIDE 40 MG PO TABS: 40.0000 mg | ORAL_TABLET | Freq: Every day | ORAL | Status: DC

## 2011-07-17 MED ORDER — SIMVASTATIN 40 MG PO TABS: 40.0000 mg | ORAL_TABLET | Freq: Every day | ORAL | Status: DC

## 2011-07-17 NOTE — Telephone Encounter (Signed)
Pt's spouse called requesting refills of Simvastatin and Furosemide to [facility] in [place]. Pt also request that rx for 40mg  dosage be sent in for both rx's since pt is taking 1/2 tablet of 80mg  and pt doesn't want to cut tablets in half anymore. Pt's spouse informed rx sent to pharmacy as requested via VM and to callback office with any questions/concerns.

## 2011-07-17 NOTE — Telephone Encounter (Signed)
Pt needs rx for 90 day supply for Furosemide and Simvastatin.

## 2011-09-08 DIAGNOSIS — E1149 Type 2 diabetes mellitus with other diabetic neurological complication: Secondary | ICD-10-CM | POA: Diagnosis not present

## 2011-09-08 DIAGNOSIS — B351 Tinea unguium: Secondary | ICD-10-CM | POA: Diagnosis not present

## 2011-09-08 DIAGNOSIS — M79609 Pain in unspecified limb: Secondary | ICD-10-CM | POA: Diagnosis not present

## 2011-09-08 DIAGNOSIS — B353 Tinea pedis: Secondary | ICD-10-CM | POA: Diagnosis not present

## 2011-10-10 ENCOUNTER — Telehealth: Payer: Self-pay | Admitting: *Deleted

## 2011-10-10 DIAGNOSIS — E785 Hyperlipidemia, unspecified: Secondary | ICD-10-CM

## 2011-10-10 DIAGNOSIS — E291 Testicular hypofunction: Secondary | ICD-10-CM

## 2011-10-10 DIAGNOSIS — D649 Anemia, unspecified: Secondary | ICD-10-CM

## 2011-10-10 DIAGNOSIS — K7689 Other specified diseases of liver: Secondary | ICD-10-CM

## 2011-10-10 DIAGNOSIS — M109 Gout, unspecified: Secondary | ICD-10-CM

## 2011-10-10 DIAGNOSIS — E876 Hypokalemia: Secondary | ICD-10-CM

## 2011-10-10 DIAGNOSIS — Z125 Encounter for screening for malignant neoplasm of prostate: Secondary | ICD-10-CM | POA: Insufficient documentation

## 2011-10-10 DIAGNOSIS — I10 Essential (primary) hypertension: Secondary | ICD-10-CM

## 2011-10-10 DIAGNOSIS — E119 Type 2 diabetes mellitus without complications: Secondary | ICD-10-CM

## 2011-10-10 NOTE — Telephone Encounter (Signed)
Pt informed lab orders placed into system-pt will have Medicare Wellness visit on that day.

## 2011-10-10 NOTE — Telephone Encounter (Signed)
Message copied by Carin Primrose on Tue Oct 10, 2011  9:06 AM ------      Message from: Newell Coral      Created: Mon Oct 09, 2011  2:19 PM      Regarding: labs?       The pt has scheduled a follow up for the week after next. He is wondering if he needs labs before the apt?

## 2011-10-10 NOTE — Telephone Encounter (Signed)
i ordered Why don't you do "medicare wellness" visit that day?

## 2011-10-13 ENCOUNTER — Other Ambulatory Visit (INDEPENDENT_AMBULATORY_CARE_PROVIDER_SITE_OTHER): Payer: Medicare Other

## 2011-10-13 ENCOUNTER — Telehealth: Payer: Self-pay | Admitting: Endocrinology

## 2011-10-13 DIAGNOSIS — E291 Testicular hypofunction: Secondary | ICD-10-CM | POA: Diagnosis not present

## 2011-10-13 DIAGNOSIS — D649 Anemia, unspecified: Secondary | ICD-10-CM

## 2011-10-13 DIAGNOSIS — I10 Essential (primary) hypertension: Secondary | ICD-10-CM | POA: Diagnosis not present

## 2011-10-13 DIAGNOSIS — M109 Gout, unspecified: Secondary | ICD-10-CM | POA: Diagnosis not present

## 2011-10-13 DIAGNOSIS — E119 Type 2 diabetes mellitus without complications: Secondary | ICD-10-CM

## 2011-10-13 DIAGNOSIS — E785 Hyperlipidemia, unspecified: Secondary | ICD-10-CM

## 2011-10-13 DIAGNOSIS — K7689 Other specified diseases of liver: Secondary | ICD-10-CM | POA: Diagnosis not present

## 2011-10-13 DIAGNOSIS — Z125 Encounter for screening for malignant neoplasm of prostate: Secondary | ICD-10-CM

## 2011-10-13 LAB — CBC WITH DIFFERENTIAL/PLATELET
Basophils Absolute: 0.1 10*3/uL (ref 0.0–0.1)
Eosinophils Absolute: 0.3 10*3/uL (ref 0.0–0.7)
HCT: 45.4 % (ref 39.0–52.0)
Hemoglobin: 14.8 g/dL (ref 13.0–17.0)
Lymphocytes Relative: 21.4 % (ref 12.0–46.0)
Lymphs Abs: 1.6 10*3/uL (ref 0.7–4.0)
MCHC: 32.6 g/dL (ref 30.0–36.0)
Neutro Abs: 4.6 10*3/uL (ref 1.4–7.7)
Platelets: 251 10*3/uL (ref 150.0–400.0)
RDW: 13.6 % (ref 11.5–14.6)

## 2011-10-13 LAB — URINALYSIS, ROUTINE W REFLEX MICROSCOPIC
Bilirubin Urine: NEGATIVE
Ketones, ur: NEGATIVE
Leukocytes, UA: NEGATIVE
Urobilinogen, UA: 0.2 (ref 0.0–1.0)

## 2011-10-13 LAB — PSA, MEDICARE: PSA: 0.34 ng/ml (ref 0.10–4.00)

## 2011-10-13 LAB — HEPATIC FUNCTION PANEL
Alkaline Phosphatase: 63 U/L (ref 39–117)
Bilirubin, Direct: 0.1 mg/dL (ref 0.0–0.3)

## 2011-10-13 LAB — LIPID PANEL
HDL: 40.1 mg/dL (ref >39.00)
LDL Cholesterol: 54 mg/dL (ref 0–99)
Total CHOL/HDL Ratio: 3
VLDL: 31.2 mg/dL (ref 0.0–40.0)

## 2011-10-13 LAB — BASIC METABOLIC PANEL
BUN: 24 mg/dL — ABNORMAL HIGH (ref 6–23)
Calcium: 9.1 mg/dL (ref 8.4–10.5)
Creatinine, Ser: 1 mg/dL (ref 0.4–1.5)
GFR: 83.31 mL/min (ref >60.00)
Glucose, Bld: 178 mg/dL — ABNORMAL HIGH (ref 70–99)

## 2011-10-13 LAB — IBC PANEL: Transferrin: 244.5 mg/dL (ref 212.0–360.0)

## 2011-10-13 LAB — MICROALBUMIN / CREATININE URINE RATIO: Microalb, Ur: 6.4 mg/dL — ABNORMAL HIGH (ref 0.0–1.9)

## 2011-10-13 LAB — TSH: TSH: 2.31 u[IU]/mL (ref 0.35–5.50)

## 2011-10-13 NOTE — Telephone Encounter (Signed)
Pt came in this morning for wellness labs but recently received a notification from CMS that he is not eligible for his Medicare Wellness until after October 1st, scheduled patient for Wellness exam in October and he will still come for an insulin pump follow up on 10/17/2011

## 2011-10-17 ENCOUNTER — Ambulatory Visit: Payer: Medicare Other | Admitting: Endocrinology

## 2011-11-03 ENCOUNTER — Encounter: Payer: Self-pay | Admitting: Endocrinology

## 2011-11-03 ENCOUNTER — Other Ambulatory Visit: Payer: Self-pay | Admitting: General Practice

## 2011-11-03 ENCOUNTER — Ambulatory Visit (INDEPENDENT_AMBULATORY_CARE_PROVIDER_SITE_OTHER): Payer: Medicare Other | Admitting: Endocrinology

## 2011-11-03 VITALS — BP 124/80 | HR 60 | Temp 97.6°F | Resp 16 | Ht 74.75 in | Wt 357.3 lb

## 2011-11-03 DIAGNOSIS — E875 Hyperkalemia: Secondary | ICD-10-CM | POA: Diagnosis not present

## 2011-11-03 DIAGNOSIS — Z23 Encounter for immunization: Secondary | ICD-10-CM

## 2011-11-03 DIAGNOSIS — Z0000 Encounter for general adult medical examination without abnormal findings: Secondary | ICD-10-CM

## 2011-11-03 DIAGNOSIS — K7689 Other specified diseases of liver: Secondary | ICD-10-CM

## 2011-11-03 DIAGNOSIS — N5089 Other specified disorders of the male genital organs: Secondary | ICD-10-CM

## 2011-11-03 DIAGNOSIS — N508 Other specified disorders of male genital organs: Secondary | ICD-10-CM

## 2011-11-03 DIAGNOSIS — E119 Type 2 diabetes mellitus without complications: Secondary | ICD-10-CM | POA: Diagnosis not present

## 2011-11-03 DIAGNOSIS — E785 Hyperlipidemia, unspecified: Secondary | ICD-10-CM

## 2011-11-03 DIAGNOSIS — M109 Gout, unspecified: Secondary | ICD-10-CM

## 2011-11-03 DIAGNOSIS — I10 Essential (primary) hypertension: Secondary | ICD-10-CM

## 2011-11-03 DIAGNOSIS — Z125 Encounter for screening for malignant neoplasm of prostate: Secondary | ICD-10-CM

## 2011-11-03 DIAGNOSIS — E291 Testicular hypofunction: Secondary | ICD-10-CM

## 2011-11-03 DIAGNOSIS — D649 Anemia, unspecified: Secondary | ICD-10-CM

## 2011-11-03 MED ORDER — HYDROCODONE-ACETAMINOPHEN 5-325 MG PO TABS: 1.0000 | ORAL_TABLET | ORAL | Status: DC | PRN

## 2011-11-03 MED ORDER — SIMVASTATIN 40 MG PO TABS: 40.0000 mg | ORAL_TABLET | Freq: Every day | ORAL | Status: DC

## 2011-11-03 MED ORDER — ALLOPURINOL 300 MG PO TABS: 300.0000 mg | ORAL_TABLET | Freq: Every day | ORAL | Status: DC

## 2011-11-03 MED ORDER — INSULIN PEN NEEDLE 31G X 8 MM MISC: 1.0000 | Status: DC | PRN

## 2011-11-03 MED ORDER — CLOTRIMAZOLE-BETAMETHASONE 1-0.05 % EX CREA: TOPICAL_CREAM | Freq: Two times a day (BID) | CUTANEOUS | Status: DC

## 2011-11-03 MED ORDER — INSULIN NPH (HUMAN) (ISOPHANE) 100 UNIT/ML ~~LOC~~ SUSP: 90.0000 [IU] | Freq: Every day | SUBCUTANEOUS | Status: DC

## 2011-11-03 MED ORDER — METAXALONE 800 MG PO TABS: 800.0000 mg | ORAL_TABLET | Freq: Three times a day (TID) | ORAL | Status: DC

## 2011-11-03 MED ORDER — INSULIN LISPRO 100 UNIT/ML ~~LOC~~ SOLN: 50.0000 [IU] | Freq: Three times a day (TID) | SUBCUTANEOUS | Status: DC

## 2011-11-03 MED ORDER — INSULIN PEN NEEDLE 29G X 12.7MM MISC: 1.0000 | Status: DC | PRN

## 2011-11-03 MED ORDER — FUROSEMIDE 40 MG PO TABS: 40.0000 mg | ORAL_TABLET | Freq: Every day | ORAL | Status: DC

## 2011-11-03 NOTE — Progress Notes (Signed)
Subjective:    Patient ID: [name], male    DOB: [birth year], 71 y.o.   MRN: [id number]  HPI Pt returns for f/u if insulin-requiring DM (dx'ed 1995; complicated by CAD and peripheral sensory neuropathy).  He reports hypoglycemia approx once a month, usually in the middle of the night.  There is otherwise no trend throughout the day.   He has few years of moderate itching of the feet, and assoc rash. He does not take androgel, and does not wish to resume. Past Medical History  Diagnosis Date  . Arthritis   . Hyperlipidemia   . Multinodular goiter   . Sleep apnea   . NASH (nonalcoholic steatohepatitis)   . DM retinopathy   . ED (erectile dysfunction)   . Colitis 1997    nonspecific  . Anemia, unspecified   . Diabetes mellitus     Type I  . DM peripheral angiopathy     Past Surgical History  Procedure Date  . Appendectomy   . Coronary artery bypass graft 2007    x 1  . Thoracotomy 2005    S/P Right Throactomy for Empyema  . Esophagogastroduodenoscopy 02/04/2003    normal    History   Social History  . Marital Status: Married    Spouse Name: N/A    Number of Children: N/A  . Years of Education: N/A   Occupational History  . Retired     laid off in 2010   Social History Main Topics  . Smoking status: Former Games developer  . Smokeless tobacco: Not on file  . Alcohol Use: Yes     rare  . Drug Use: No  . Sexually Active:    Other Topics Concern  . Not on file   Social History Narrative   Regular exercise-swims once dayDaily Caffeine Use: 1-2 cups per day    Current Outpatient Prescriptions on File Prior to Visit  Medication Sig Dispense Refill  . aspirin (ASPIRIN ADULT LOW STRENGTH) 81 MG EC tablet Take 81 mg by mouth once.       . colchicine (COLCRYS) 0.6 MG tablet Take 0.6 mg by mouth every hour as needed. Not to exceed 6 per day       . Multiple Vitamin (MULTIVITAMIN) tablet Take 1 tablet by mouth daily.        Marland Kitchen testosterone (TESTIM) 50 MG/5GM GEL Apply  10 g topically daily.  300 g  5  . DISCONTD: allopurinol (ZYLOPRIM) 300 MG tablet Take 1 tablet (300 mg total) by mouth daily.  90 tablet  3  . DISCONTD: furosemide (LASIX) 40 MG tablet Take 1 tablet (40 mg total) by mouth daily.  90 tablet  0  . DISCONTD: insulin lispro (HUMALOG) 100 UNIT/ML injection Inject 50 Units into the skin 3 (three) times daily before meals.  45 mL  3  . DISCONTD: insulin NPH (HUMULIN N PEN) 100 UNIT/ML injection Inject 90 Units into the skin at bedtime.  30 mL  3  . DISCONTD: simvastatin (ZOCOR) 40 MG tablet Take 1 tablet (40 mg total) by mouth daily.  90 tablet  0    No Known Allergies  Family History  Problem Relation Age of Onset  . Crohn's disease Father   . Cancer Neg Hx     BP 124/80  Pulse 60  Temp 97.6 F (36.4 C) (Oral)  Resp 16  Ht 6' 2.75" (1.899 m)  Wt 357 lb 5 oz (162.076 kg)  BMI 44.96 kg/m2  SpO2 97%  Review of Systems Denies LOC.  He has lost weight, due to his efforts. He has a right scrotal mass.    Objective:   Physical Exam VITAL SIGNS:  See vs page GENERAL: no distress Scrotum: 3 cm right-sided mass Pulses: dorsalis pedis intact bilat.   Feet: no deformity.  no ulcer on the feet.  feet are of normal color and temp.  1+ bilat leg edema.  There is bilateral onychomycosis, and eczematous rash.  Healing 5 mm abrasion on the plantar aspect of the right great toe. Neuro: sensation is intact to touch on the feet, but decreased from normal.       Lab Results  Component Value Date   WBC 7.4 10/13/2011   HGB 14.8 10/13/2011   HCT 45.4 10/13/2011   PLT 251.0 10/13/2011   GLUCOSE 178* 10/13/2011   CHOL 125 10/13/2011   TRIG 156.0* 10/13/2011   HDL 40.10 10/13/2011   LDLCALC 54 10/13/2011   ALT 26 10/13/2011   AST 25 10/13/2011   NA 138 10/13/2011   K 4.4 10/13/2011   CL 102 10/13/2011   CREATININE 1.0 10/13/2011   BUN 24* 10/13/2011   CO2 28 10/13/2011   TSH 2.31 10/13/2011   PSA 0.34 10/13/2011   INR 1.1 09/17/2006   HGBA1C 7.4*  10/13/2011   MICROALBUR 6.4* 10/13/2011      Assessment & Plan:  Scrotal mass, new Tinea pedis, new Hypogonadism, further rx declined DM, needs increased rx  Subjective:   Patient here for Medicare annual wellness visit and management of other chronic and acute problems.      Risk factors: advanced age.    Roster of Physicians Providing Medical Care to Patient:  See "snapshot"    Activities of Daily Living: In your present state of health, do you have any difficulty performing the following activities?:  Preparing food and eating?: No  Bathing yourself: No  Getting dressed: No  Using the toilet: No  Moving around from place to place: No  In the past year have you fallen or had a near fall?: No    Home Safety: Has smoke detector and wears seat belts. No firearms. No excess sun exposure.  Diet and Exercise  Current exercise habits: pt says good Dietary issues discussed: pt reports a healthy diet   Depression Screen  Q1: Over the past two weeks, have you felt down, depressed or hopeless?no  Q2: Over the past two weeks, have you felt little interest or pleasure in doing things? no   The following portions of the patient's history were reviewed and updated as appropriate: allergies, current medications, past family history, past medical history, past social history, past surgical history and problem list.   Review of Systems  Denies hearing loss, and visual loss Objective:   Vision:  Sees opthalmologist Hearing: grossly normal Body mass index:  See vs page Msk: pt easily and quickly performs "get-up-and-go" from a sitting position Cognitive Impairment Assessment: cognition, memory and judgment appear normal.  remembers 3/3 at 5 minutes.  excellent recall.  can easily read and write a sentence.  alert and oriented x 3   Assessment:   Medicare wellness utd on preventive parameters    Plan:   During the course of the visit the patient was educated and counseled about  appropriate screening and preventive services including:        Fall prevention   Diabetes screening  Nutrition counseling   Vaccines / LABS Zostavax / Pnemonccoal Vaccine  today  PSA  Patient Instructions (the written plan) was given to the patient.

## 2011-11-03 NOTE — Patient Instructions (Addendum)
please consider these measures for your health:  minimize alcohol.  do not use tobacco products.  have a colonoscopy at least every 10 years from age 71.  keep firearms safely stored.  always use seat belts.  have working smoke alarms in your home.  see an eye doctor and dentist regularly.  never drive under the influence of alcohol or drugs (including prescription drugs).  those with fair skin should take precautions against the sun. blood tests are being requested for you today.  You will be contacted with results.  Please come back for a follow-up appointment in 3 months. Refer to a diabetes education specialist, to consider the insulin pump.  you will receive a phone call, about a day and time for an appointment.   i have sent a prescription to your pharmacy, for a cream against the athlete's foot.

## 2011-11-20 ENCOUNTER — Other Ambulatory Visit: Payer: Self-pay | Admitting: Endocrinology

## 2011-11-20 MED ORDER — CLOTRIMAZOLE-BETAMETHASONE 1-0.05 % EX CREA: TOPICAL_CREAM | Freq: Two times a day (BID) | CUTANEOUS | Status: DC

## 2011-11-20 NOTE — Telephone Encounter (Signed)
Pt is almost out of Lotrisone after a week and a half. Rx is working, but issue is still present. Can we call him in a larger quantity to be picked up? Fill at [facility] on [address] Please call once completed.

## 2011-11-20 NOTE — Telephone Encounter (Signed)
Pt.notified

## 2011-11-20 NOTE — Telephone Encounter (Signed)
done

## 2011-11-22 ENCOUNTER — Encounter: Payer: Medicare Other | Attending: Endocrinology | Admitting: *Deleted

## 2011-11-22 ENCOUNTER — Other Ambulatory Visit: Payer: Self-pay | Admitting: Endocrinology

## 2011-11-22 VITALS — Ht 74.75 in

## 2011-11-22 DIAGNOSIS — E1065 Type 1 diabetes mellitus with hyperglycemia: Secondary | ICD-10-CM

## 2011-11-22 DIAGNOSIS — Z713 Dietary counseling and surveillance: Secondary | ICD-10-CM | POA: Diagnosis not present

## 2011-11-22 DIAGNOSIS — E119 Type 2 diabetes mellitus without complications: Secondary | ICD-10-CM

## 2011-11-22 NOTE — Progress Notes (Signed)
Intensive Insulin Progress Note:  Patient states he is interested in information about an insulin pump. He states he was diagnosed with DM2 25 years ago and currently takes insulin @ 4 injections per day. He denies previous diabetes education on carb counting and his insulin doses are fixed before meals and at bedtime. He states he does SMBG daily and experiences variability between hypo and hyperglycemia. He exercises with swimming several days a week, which he enjoys. He states that with the neuropathy in his feet, he has injured his feet seriously twice in the past month.  Current intensive insulin regime is: Basal insulin of Humalin N @ 90 units @ bedtime Meal Bolus insulin of Humalog @ 50 units before each meal He states he does not have a Correction Bolus insulin   Assessment: A1c: 7.4 % Weight: 357.5 #  Intervention: Discussed difference of insulin action and delivery between injections and insulin pump delivery. Offered benefits of more accurate delivery, potential customization of basal delivery and flexibility of bolus being adjusted based on carb intake and BG correction opportunities. Demonstrated  insertion  of infusion sets and basic button pushing of Medtronic pump. (the only demo pump I have available to me). I did explain that Medicare will require proof of his insulin production and suggested he request a C-Peptide lab draw before moving forward with pump inquiry. Patient indicated he wants to pursue pump therapy so I provided him with the Marketing Packet for the [facility] and [employer] insulin pumps, including contact information for the [employer].  Patient Instructions:  Plan: Contact Dr. George Hugh office regarding a C-Peptide test required by Medicare for an insulin pump to see if you qualify Review pump materials provided and consider visiting their websites   Follow Up: PRN and for training if Medtronic pump is ordered. If [facility] pump is ordered, we  will contact the local clinical manager for training.

## 2011-11-22 NOTE — Patient Instructions (Addendum)
Plan: Contact Dr. George Hugh office regarding a C-Peptide test required by Medicare for an insulin pump Review pump materials provided and consider visiting their websites

## 2011-11-29 ENCOUNTER — Other Ambulatory Visit: Payer: Medicare Other

## 2011-11-29 ENCOUNTER — Telehealth: Payer: Self-pay | Admitting: *Deleted

## 2011-11-29 DIAGNOSIS — E1065 Type 1 diabetes mellitus with hyperglycemia: Secondary | ICD-10-CM | POA: Diagnosis not present

## 2011-11-29 NOTE — Telephone Encounter (Signed)
[facility] called and requested we change the c peptide order to [facility], test cannot be done in house.

## 2011-11-30 ENCOUNTER — Encounter: Payer: Self-pay | Admitting: *Deleted

## 2011-12-11 ENCOUNTER — Telehealth: Payer: Self-pay

## 2011-12-11 DIAGNOSIS — N5089 Other specified disorders of the male genital organs: Secondary | ICD-10-CM

## 2011-12-11 NOTE — Telephone Encounter (Signed)
Pt advised and states an understanding per dr.ellion's message

## 2011-12-11 NOTE — Telephone Encounter (Signed)
Pt wants to discuss his labs with you pt's c-peptide was normal, but pt states you mentioned to him about getting an insulin pump is this still necessary since pt's labs were normal , also pt would like to know if he needs to continue taking insulin? Please advise also pt would like to get a referral to a urologist in [place],

## 2011-12-11 NOTE — Telephone Encounter (Signed)
This insulin dosage is much too high to consider going back to [facility].

## 2011-12-11 NOTE — Telephone Encounter (Signed)
i resent urol ref, requesting [place] If pt doesn't meet lab criteria, medicare won't pay for pump or pump supplies

## 2011-12-11 NOTE — Telephone Encounter (Signed)
Pt would like to speak with [facility] regarding his recent test results.  Also, he waited over 1 1/2 hours at his urology appt and was never seen.  Does not wish to go back to them.  Is there a urology office any where near [place] he can go to?

## 2011-12-12 ENCOUNTER — Telehealth: Payer: Self-pay

## 2011-12-12 NOTE — Telephone Encounter (Signed)
No we can't, because it is not secure.  It would have to go through [place] or med records

## 2011-12-12 NOTE — Telephone Encounter (Signed)
Pt would like a copy of his C peptide results emailed to him at [email or web address]. Is this possible?

## 2011-12-12 NOTE — Telephone Encounter (Signed)
Pt left msg that we would like a copy of his C peptide emailed to him at [email or web address].

## 2011-12-15 NOTE — Telephone Encounter (Signed)
Pt advised he needs to contact medical records or access mychart

## 2012-02-12 ENCOUNTER — Ambulatory Visit (INDEPENDENT_AMBULATORY_CARE_PROVIDER_SITE_OTHER): Payer: Medicare Other | Admitting: Endocrinology

## 2012-02-12 VITALS — BP 130/74 | HR 97 | Temp 98.3°F | Wt 374.0 lb

## 2012-02-12 DIAGNOSIS — R221 Localized swelling, mass and lump, neck: Secondary | ICD-10-CM | POA: Diagnosis not present

## 2012-02-12 DIAGNOSIS — E1065 Type 1 diabetes mellitus with hyperglycemia: Secondary | ICD-10-CM

## 2012-02-12 DIAGNOSIS — N5089 Other specified disorders of the male genital organs: Secondary | ICD-10-CM

## 2012-02-12 DIAGNOSIS — R220 Localized swelling, mass and lump, head: Secondary | ICD-10-CM

## 2012-02-12 DIAGNOSIS — N508 Other specified disorders of male genital organs: Secondary | ICD-10-CM

## 2012-02-12 MED ORDER — INSULIN PEN NEEDLE 31G X 8 MM MISC: 1.0000 | Status: DC | PRN

## 2012-02-12 MED ORDER — CEPHALEXIN 500 MG PO TABS: 500.0000 mg | ORAL_TABLET | Freq: Four times a day (QID) | ORAL | Status: DC

## 2012-02-12 MED ORDER — INSULIN PEN NEEDLE 32G X 4 MM MISC: 1.0000 | Freq: Four times a day (QID) | Status: DC

## 2012-02-12 NOTE — Progress Notes (Signed)
Subjective:    Patient ID: [name], male    DOB: May 13, 1940, 72 y.o.   MRN: [id number]  HPI Pt returns for f/u if insulin-requiring DM (dx'ed 1995; complicated by CAD and peripheral sensory neuropathy). no cbg record, but states cbg's are in the mid-100's.  There is no trend throughout the day.   Pt states few days of moderate swelling at the left parotid area.  No assoc fever.   Past Medical History  Diagnosis Date  . Arthritis   . Hyperlipidemia   . Multinodular goiter   . Sleep apnea   . NASH (nonalcoholic steatohepatitis)   . DM retinopathy   . ED (erectile dysfunction)   . Colitis 1997    nonspecific  . Anemia, unspecified   . Diabetes mellitus     Type I  . DM peripheral angiopathy     Past Surgical History  Procedure Date  . Appendectomy   . Coronary artery bypass graft 2007    x 1  . Thoracotomy 2005    S/P Right Throactomy for Empyema  . Esophagogastroduodenoscopy 02/04/2003    normal    History   Social History  . Marital Status: Married    Spouse Name: N/A    Number of Children: N/A  . Years of Education: N/A   Occupational History  . Retired     laid off in 2010   Social History Main Topics  . Smoking status: Former Games developer  . Smokeless tobacco: Not on file  . Alcohol Use: Yes     Comment: rare  . Drug Use: No  . Sexually Active:    Other Topics Concern  . Not on file   Social History Narrative   Regular exercise-swims once dayDaily Caffeine Use: 1-2 cups per day    Current Outpatient Prescriptions on File Prior to Visit  Medication Sig Dispense Refill  . aspirin (ASPIRIN ADULT LOW STRENGTH) 81 MG EC tablet Take 81 mg by mouth once.       . clotrimazole-betamethasone (LOTRISONE) cream Apply topically 2 (two) times daily.  90 g  3  . colchicine (COLCRYS) 0.6 MG tablet Take 0.6 mg by mouth every hour as needed. Not to exceed 6 per day       . HYDROcodone-acetaminophen (NORCO/VICODIN) 5-325 MG per tablet Take 1 tablet by mouth every 4  (four) hours as needed for pain. For pain  50 tablet  3  . insulin lispro (HUMALOG) 100 UNIT/ML injection Inject 50 Units into the skin 3 (three) times daily before meals.  45 mL  3  . insulin NPH (HUMULIN N PEN) 100 UNIT/ML injection Inject 90 Units into the skin at bedtime.  30 mL  3  . Insulin Pen Needle (BD ULTRA-FINE PEN NEEDLES) 29G X 12.7MM MISC Inject 1 each into the skin as needed.  200 each  3  . metaxalone (SKELAXIN) 800 MG tablet Take 1 tablet (800 mg total) by mouth 3 (three) times daily.  90 tablet  5  . Multiple Vitamin (MULTIVITAMIN) tablet Take 1 tablet by mouth daily.        Marland Kitchen testosterone (TESTIM) 50 MG/5GM GEL Apply 10 g topically daily.  300 g  5  . allopurinol (ZYLOPRIM) 300 MG tablet Take 1 tablet (300 mg total) by mouth daily.  90 tablet  3  . furosemide (LASIX) 40 MG tablet Take 1 tablet (40 mg total) by mouth daily.  90 tablet  3  . simvastatin (ZOCOR) 40 MG tablet Take 1  tablet (40 mg total) by mouth daily.  90 tablet  3    No Known Allergies  Family History  Problem Relation Age of Onset  . Crohn's disease Father   . Cancer Neg Hx     BP 130/74  Pulse 97  Temp 98.3 F (36.8 C) (Oral)  Wt 374 lb (169.645 kg)  SpO2 95%  Review of Systems denies hypoglycemia.  He has nasal congestion.      Objective:   Physical Exam VITAL SIGNS:  See vs page GENERAL: no distress head: no deformity eyes: no periorbital swelling, no proptosis external nose and ears are normal mouth: no lesion seen There is slight swelling and tenderness at the left parotid/preauricaular area.  Both eac's and tm's are normal      Assessment & Plan:  Facial swelling, new, uncertain etiology DM, uncertain control

## 2012-02-12 NOTE — Patient Instructions (Addendum)
i have sent a prescription to your pharmacy, for the antibiotic pill.   I hope you feel better soon.  If you don't feel better by next week, please call back.  Please call sooner if you get worse.   Please come back for a follow-up appointment in 3 months.   check your blood sugar twice a day.  vary the time of day when you check, between before the 3 meals, and at bedtime.  also check if you have symptoms of your blood sugar being too high or too low.  please keep a record of the readings and bring it to your next appointment here.  please call us sooner if your blood sugar goes below 70, or if you have a lot of readings over 200.   blood tests are being requested for you today.  Please come in fasting.  We'll contact you with results.

## 2012-02-13 ENCOUNTER — Other Ambulatory Visit: Payer: Self-pay

## 2012-02-13 MED ORDER — SIMVASTATIN 40 MG PO TABS: 40.0000 mg | ORAL_TABLET | Freq: Every day | ORAL | Status: DC

## 2012-02-13 MED ORDER — FUROSEMIDE 40 MG PO TABS: 40.0000 mg | ORAL_TABLET | Freq: Every day | ORAL | Status: DC

## 2012-02-13 MED ORDER — ALLOPURINOL 300 MG PO TABS: 300.0000 mg | ORAL_TABLET | Freq: Every day | ORAL | Status: DC

## 2012-02-26 DIAGNOSIS — N529 Male erectile dysfunction, unspecified: Secondary | ICD-10-CM | POA: Diagnosis not present

## 2012-02-26 DIAGNOSIS — N508 Other specified disorders of male genital organs: Secondary | ICD-10-CM | POA: Diagnosis not present

## 2012-02-26 DIAGNOSIS — R350 Frequency of micturition: Secondary | ICD-10-CM | POA: Diagnosis not present

## 2012-03-27 ENCOUNTER — Telehealth: Payer: Self-pay | Admitting: Endocrinology

## 2012-03-27 DIAGNOSIS — N390 Urinary tract infection, site not specified: Secondary | ICD-10-CM | POA: Diagnosis not present

## 2012-03-27 DIAGNOSIS — R3989 Other symptoms and signs involving the genitourinary system: Secondary | ICD-10-CM | POA: Diagnosis not present

## 2012-03-27 NOTE — Telephone Encounter (Signed)
Pt's wife stated saw a urologist today and got a rx

## 2012-03-27 NOTE — Telephone Encounter (Signed)
The patient left message on office voicemail to request medication and appointment to see Dr. Everardo All today for probable bladder infection.  The patient called back to state that he will probably go to the hospital due to the infection being worse today.  The patient requests to speak with nurse regarding this prior to going to the hospital.  The patient may be reached at [phone].

## 2012-03-29 DIAGNOSIS — G4733 Obstructive sleep apnea (adult) (pediatric): Secondary | ICD-10-CM | POA: Diagnosis present

## 2012-03-29 DIAGNOSIS — R42 Dizziness and giddiness: Secondary | ICD-10-CM | POA: Diagnosis not present

## 2012-03-29 DIAGNOSIS — S199XXA Unspecified injury of neck, initial encounter: Secondary | ICD-10-CM | POA: Diagnosis not present

## 2012-03-29 DIAGNOSIS — R32 Unspecified urinary incontinence: Secondary | ICD-10-CM | POA: Diagnosis not present

## 2012-03-29 DIAGNOSIS — I2510 Atherosclerotic heart disease of native coronary artery without angina pectoris: Secondary | ICD-10-CM | POA: Diagnosis present

## 2012-03-29 DIAGNOSIS — R404 Transient alteration of awareness: Secondary | ICD-10-CM | POA: Diagnosis not present

## 2012-03-29 DIAGNOSIS — Z8701 Personal history of pneumonia (recurrent): Secondary | ICD-10-CM | POA: Diagnosis not present

## 2012-03-29 DIAGNOSIS — E869 Volume depletion, unspecified: Secondary | ICD-10-CM | POA: Diagnosis not present

## 2012-03-29 DIAGNOSIS — R0602 Shortness of breath: Secondary | ICD-10-CM | POA: Diagnosis not present

## 2012-03-29 DIAGNOSIS — N400 Enlarged prostate without lower urinary tract symptoms: Secondary | ICD-10-CM | POA: Diagnosis present

## 2012-03-29 DIAGNOSIS — N179 Acute kidney failure, unspecified: Secondary | ICD-10-CM | POA: Diagnosis not present

## 2012-03-29 DIAGNOSIS — E871 Hypo-osmolality and hyponatremia: Secondary | ICD-10-CM | POA: Diagnosis not present

## 2012-03-29 DIAGNOSIS — IMO0001 Reserved for inherently not codable concepts without codable children: Secondary | ICD-10-CM | POA: Diagnosis not present

## 2012-03-29 DIAGNOSIS — E785 Hyperlipidemia, unspecified: Secondary | ICD-10-CM | POA: Diagnosis present

## 2012-03-29 DIAGNOSIS — Z7982 Long term (current) use of aspirin: Secondary | ICD-10-CM | POA: Diagnosis not present

## 2012-03-29 DIAGNOSIS — S0993XA Unspecified injury of face, initial encounter: Secondary | ICD-10-CM | POA: Diagnosis not present

## 2012-03-29 DIAGNOSIS — M109 Gout, unspecified: Secondary | ICD-10-CM | POA: Diagnosis present

## 2012-03-29 DIAGNOSIS — M6281 Muscle weakness (generalized): Secondary | ICD-10-CM | POA: Diagnosis not present

## 2012-03-29 DIAGNOSIS — L723 Sebaceous cyst: Secondary | ICD-10-CM | POA: Diagnosis present

## 2012-03-29 DIAGNOSIS — M542 Cervicalgia: Secondary | ICD-10-CM | POA: Diagnosis present

## 2012-03-29 DIAGNOSIS — N390 Urinary tract infection, site not specified: Secondary | ICD-10-CM | POA: Diagnosis not present

## 2012-03-29 DIAGNOSIS — R5381 Other malaise: Secondary | ICD-10-CM | POA: Diagnosis not present

## 2012-03-29 DIAGNOSIS — Z951 Presence of aortocoronary bypass graft: Secondary | ICD-10-CM | POA: Diagnosis not present

## 2012-03-29 DIAGNOSIS — Z6841 Body Mass Index (BMI) 40.0 and over, adult: Secondary | ICD-10-CM | POA: Diagnosis not present

## 2012-03-29 DIAGNOSIS — E119 Type 2 diabetes mellitus without complications: Secondary | ICD-10-CM | POA: Diagnosis present

## 2012-03-29 DIAGNOSIS — Z794 Long term (current) use of insulin: Secondary | ICD-10-CM | POA: Diagnosis not present

## 2012-03-29 DIAGNOSIS — R269 Unspecified abnormalities of gait and mobility: Secondary | ICD-10-CM | POA: Diagnosis not present

## 2012-04-10 ENCOUNTER — Other Ambulatory Visit: Payer: Self-pay | Admitting: *Deleted

## 2012-04-10 ENCOUNTER — Ambulatory Visit (INDEPENDENT_AMBULATORY_CARE_PROVIDER_SITE_OTHER): Payer: Medicare Other | Admitting: Endocrinology

## 2012-04-10 ENCOUNTER — Encounter: Payer: Self-pay | Admitting: Endocrinology

## 2012-04-10 VITALS — BP 132/80 | HR 84 | Wt 382.0 lb

## 2012-04-10 DIAGNOSIS — E119 Type 2 diabetes mellitus without complications: Secondary | ICD-10-CM | POA: Diagnosis not present

## 2012-04-10 DIAGNOSIS — R21 Rash and other nonspecific skin eruption: Secondary | ICD-10-CM | POA: Diagnosis not present

## 2012-04-10 MED ORDER — INSULIN NPH (HUMAN) (ISOPHANE) 100 UNIT/ML ~~LOC~~ SUSP: 90.0000 [IU] | Freq: Every day | SUBCUTANEOUS | Status: DC

## 2012-04-10 MED ORDER — INSULIN PEN NEEDLE 31G X 8 MM MISC: 1.0000 | Status: DC | PRN

## 2012-04-10 MED ORDER — "INSULIN SYRINGE-NEEDLE U-100 31G X 5/16"" 0.5 ML MISC": Status: DC

## 2012-04-10 MED ORDER — INSULIN LISPRO 100 UNIT/ML ~~LOC~~ SOLN: 50.0000 [IU] | Freq: Three times a day (TID) | SUBCUTANEOUS | Status: DC

## 2012-04-10 NOTE — Progress Notes (Signed)
Subjective:    Patient ID: [name], male    DOB: Mar 18, 1940, 72 y.o.   MRN: [id number]  HPI Pt was hospitalized last week with UTI, at [facility].  He was rx'ed with cefinidir and flomax.  He is scheduled to f/u with urol next week.   Pt returns for f/u if insulin-requiring DM (dx'ed 1995; complicated by CAD and peripheral sensory.   neuropathy; he has never had severe hypoglycemia). no cbg record, but pt says it is well-controlled.   Pt states few years of moderate rash on the feet, but no assoc itching Past Medical History  Diagnosis Date  . Arthritis   . Hyperlipidemia   . Multinodular goiter   . Sleep apnea   . NASH (nonalcoholic steatohepatitis)   . DM retinopathy   . ED (erectile dysfunction)   . Colitis 1997    nonspecific  . Anemia, unspecified   . Diabetes mellitus     Type I  . DM peripheral angiopathy     Past Surgical History  Procedure Laterality Date  . Appendectomy    . Coronary artery bypass graft  2007    x 1  . Thoracotomy  2005    S/P Right Throactomy for Empyema  . Esophagogastroduodenoscopy  02/04/2003    normal    History   Social History  . Marital Status: Married    Spouse Name: N/A    Number of Children: N/A  . Years of Education: N/A   Occupational History  . Retired     laid off in 2010   Social History Main Topics  . Smoking status: Former Games developer  . Smokeless tobacco: Not on file  . Alcohol Use: Yes     Comment: rare  . Drug Use: No  . Sexually Active:    Other Topics Concern  . Not on file   Social History Narrative   Regular exercise-swims once day   Daily Caffeine Use: 1-2 cups per day    Current Outpatient Prescriptions on File Prior to Visit  Medication Sig Dispense Refill  . allopurinol (ZYLOPRIM) 300 MG tablet Take 1 tablet (300 mg total) by mouth daily.  90 tablet  3  . aspirin (ASPIRIN ADULT LOW STRENGTH) 81 MG EC tablet Take 81 mg by mouth once.       . Cephalexin 500 MG tablet Take 1 tablet (500  mg total) by mouth 4 (four) times daily.  40 tablet  0  . clotrimazole-betamethasone (LOTRISONE) cream Apply topically 2 (two) times daily.  90 g  3  . colchicine (COLCRYS) 0.6 MG tablet Take 0.6 mg by mouth every hour as needed. Not to exceed 6 per day       . furosemide (LASIX) 40 MG tablet Take 1 tablet (40 mg total) by mouth daily.  90 tablet  0  . furosemide (LASIX) 40 MG tablet Take 1 tablet (40 mg total) by mouth daily.  90 tablet  3  . HYDROcodone-acetaminophen (NORCO/VICODIN) 5-325 MG per tablet Take 1 tablet by mouth every 4 (four) hours as needed for pain. For pain  50 tablet  3  . Insulin Pen Needle (BD PEN NEEDLE NANO U/F) 32G X 4 MM MISC 1 Device by Does not apply route 4 (four) times daily.  100 each  3  . Insulin Pen Needle (BD ULTRA-FINE PEN NEEDLES) 29G X 12.7MM MISC Inject 1 each into the skin as needed.  200 each  3  . metaxalone (SKELAXIN) 800 MG tablet Take  1 tablet (800 mg total) by mouth 3 (three) times daily.  90 tablet  5  . Multiple Vitamin (MULTIVITAMIN) tablet Take 1 tablet by mouth daily.        . simvastatin (ZOCOR) 40 MG tablet Take 1 tablet (40 mg total) by mouth daily.  90 tablet  3  . testosterone (TESTIM) 50 MG/5GM GEL Apply 10 g topically daily.  300 g  5   No current facility-administered medications on file prior to visit.    No Known Allergies  Family History  Problem Relation Age of Onset  . Crohn's disease Father   . Cancer Neg Hx     BP 132/80  Pulse 84  Wt 382 lb (173.274 kg)  BMI 48.05 kg/m2  SpO2 93%   Review of Systems denies hypoglycemia.  He has chronic numbness of the feet.    Objective:   Physical Exam VITAL SIGNS:  See vs page GENERAL: no distress Pulses: dorsalis pedis intact bilat.   Feet: no deformity.  no ulcer on the feet.  feet are of normal color and temp.  1+ bilat leg edema.  There is bilateral onychomycosis, and eczematous rash.   Neuro: sensation is intact to touch on the feet, but decreased from normal.        Assessment & Plan:  Tinea pedis, new DM, apparently well-controlled UTI, clinically better

## 2012-04-10 NOTE — Patient Instructions (Addendum)
Please do the fasting blood tests are [facility], any day. Please resume the furosemide. Please come back for a follow-up appointment in 3 months.   Refer to 2 specialists.  you will receive a phone call, about days and times for appointments.

## 2012-04-18 ENCOUNTER — Other Ambulatory Visit: Payer: Self-pay | Admitting: *Deleted

## 2012-04-18 ENCOUNTER — Other Ambulatory Visit (INDEPENDENT_AMBULATORY_CARE_PROVIDER_SITE_OTHER): Payer: Medicare Other

## 2012-04-18 DIAGNOSIS — E119 Type 2 diabetes mellitus without complications: Secondary | ICD-10-CM | POA: Diagnosis not present

## 2012-04-18 DIAGNOSIS — N390 Urinary tract infection, site not specified: Secondary | ICD-10-CM | POA: Diagnosis not present

## 2012-04-18 DIAGNOSIS — R350 Frequency of micturition: Secondary | ICD-10-CM | POA: Diagnosis not present

## 2012-04-18 LAB — HEMOGLOBIN A1C: Hgb A1c MFr Bld: 9.3 % — ABNORMAL HIGH (ref 4.6–6.5)

## 2012-04-18 LAB — BASIC METABOLIC PANEL
Calcium: 8.8 mg/dL (ref 8.4–10.5)
GFR: 57.92 mL/min — ABNORMAL LOW (ref >60.00)
Glucose, Bld: 285 mg/dL — ABNORMAL HIGH (ref 70–99)
Potassium: 4.6 mEq/L (ref 3.5–5.1)
Sodium: 137 mEq/L (ref 135–145)

## 2012-04-18 LAB — C-PEPTIDE: C-Peptide: 2.57 ng/mL (ref 0.80–3.90)

## 2012-04-18 MED ORDER — INSULIN PEN NEEDLE 31G X 5 MM MISC: Status: DC

## 2012-04-18 NOTE — Telephone Encounter (Signed)
Wrong pen needle size ordered, re-ordering UF Mini 31gx3/16".

## 2012-04-23 DIAGNOSIS — L219 Seborrheic dermatitis, unspecified: Secondary | ICD-10-CM | POA: Diagnosis not present

## 2012-04-23 DIAGNOSIS — L570 Actinic keratosis: Secondary | ICD-10-CM | POA: Diagnosis not present

## 2012-04-24 LAB — ANTI-ISLET CELL ANTIBODY: Pancreatic Islet Cell Antibody: <5 JDF Units (ref <5)

## 2012-05-08 ENCOUNTER — Telehealth: Payer: Self-pay | Admitting: Endocrinology

## 2012-05-08 NOTE — Telephone Encounter (Signed)
[facility] faxed orders for patient.  Dr.Ellison asked that the order's be signed through the pt's pulmonary dr.  This was noted on the order sheet and faxed back to [facility] listing Dr.Clance as the the pt's pulmonary dr (last seen 2011)

## 2012-05-14 DIAGNOSIS — B353 Tinea pedis: Secondary | ICD-10-CM | POA: Diagnosis not present

## 2012-05-14 DIAGNOSIS — L820 Inflamed seborrheic keratosis: Secondary | ICD-10-CM | POA: Diagnosis not present

## 2012-06-13 DIAGNOSIS — B353 Tinea pedis: Secondary | ICD-10-CM | POA: Diagnosis not present

## 2012-06-13 DIAGNOSIS — D2370 Other benign neoplasm of skin of unspecified lower limb, including hip: Secondary | ICD-10-CM | POA: Diagnosis not present

## 2012-08-28 ENCOUNTER — Other Ambulatory Visit: Payer: Self-pay | Admitting: Endocrinology

## 2012-08-29 DIAGNOSIS — N400 Enlarged prostate without lower urinary tract symptoms: Secondary | ICD-10-CM | POA: Diagnosis not present

## 2012-09-04 ENCOUNTER — Other Ambulatory Visit: Payer: Self-pay

## 2012-09-25 ENCOUNTER — Telehealth: Payer: Self-pay | Admitting: Endocrinology

## 2012-09-25 MED ORDER — INSULIN LISPRO 100 UNIT/ML (KWIKPEN): PEN_INJECTOR | SUBCUTANEOUS | Status: DC

## 2012-10-05 DIAGNOSIS — E1149 Type 2 diabetes mellitus with other diabetic neurological complication: Secondary | ICD-10-CM | POA: Diagnosis not present

## 2012-10-05 DIAGNOSIS — M79609 Pain in unspecified limb: Secondary | ICD-10-CM | POA: Diagnosis not present

## 2012-10-05 DIAGNOSIS — B353 Tinea pedis: Secondary | ICD-10-CM | POA: Diagnosis not present

## 2012-10-05 DIAGNOSIS — B351 Tinea unguium: Secondary | ICD-10-CM | POA: Diagnosis not present

## 2012-10-16 DIAGNOSIS — M259 Joint disorder, unspecified: Secondary | ICD-10-CM | POA: Diagnosis not present

## 2012-10-16 DIAGNOSIS — S59909A Unspecified injury of unspecified elbow, initial encounter: Secondary | ICD-10-CM | POA: Diagnosis not present

## 2012-10-16 DIAGNOSIS — M25429 Effusion, unspecified elbow: Secondary | ICD-10-CM | POA: Diagnosis not present

## 2012-10-23 DIAGNOSIS — S52123A Displaced fracture of head of unspecified radius, initial encounter for closed fracture: Secondary | ICD-10-CM | POA: Diagnosis not present

## 2012-11-04 ENCOUNTER — Other Ambulatory Visit: Payer: Self-pay | Admitting: Endocrinology

## 2012-11-06 DIAGNOSIS — S52123A Displaced fracture of head of unspecified radius, initial encounter for closed fracture: Secondary | ICD-10-CM | POA: Diagnosis not present

## 2012-11-17 ENCOUNTER — Other Ambulatory Visit: Payer: Self-pay | Admitting: Endocrinology

## 2012-12-02 ENCOUNTER — Encounter: Payer: Self-pay | Admitting: Endocrinology

## 2012-12-02 ENCOUNTER — Ambulatory Visit (INDEPENDENT_AMBULATORY_CARE_PROVIDER_SITE_OTHER): Payer: Medicare Other | Admitting: Endocrinology

## 2012-12-02 VITALS — BP 130/82 | HR 78 | Temp 98.3°F | Resp 12 | Ht 75.0 in | Wt 388.5 lb

## 2012-12-02 DIAGNOSIS — Z23 Encounter for immunization: Secondary | ICD-10-CM | POA: Diagnosis not present

## 2012-12-02 DIAGNOSIS — I10 Essential (primary) hypertension: Secondary | ICD-10-CM

## 2012-12-02 DIAGNOSIS — E1049 Type 1 diabetes mellitus with other diabetic neurological complication: Secondary | ICD-10-CM

## 2012-12-02 DIAGNOSIS — I509 Heart failure, unspecified: Secondary | ICD-10-CM

## 2012-12-02 DIAGNOSIS — R0609 Other forms of dyspnea: Secondary | ICD-10-CM

## 2012-12-02 DIAGNOSIS — K7689 Other specified diseases of liver: Secondary | ICD-10-CM

## 2012-12-02 DIAGNOSIS — D649 Anemia, unspecified: Secondary | ICD-10-CM

## 2012-12-02 DIAGNOSIS — E119 Type 2 diabetes mellitus without complications: Secondary | ICD-10-CM | POA: Diagnosis not present

## 2012-12-02 DIAGNOSIS — N390 Urinary tract infection, site not specified: Secondary | ICD-10-CM

## 2012-12-02 DIAGNOSIS — M109 Gout, unspecified: Secondary | ICD-10-CM

## 2012-12-02 DIAGNOSIS — R0989 Other specified symptoms and signs involving the circulatory and respiratory systems: Secondary | ICD-10-CM

## 2012-12-02 DIAGNOSIS — E785 Hyperlipidemia, unspecified: Secondary | ICD-10-CM

## 2012-12-02 LAB — URINALYSIS, ROUTINE W REFLEX MICROSCOPIC
Bilirubin Urine: NEGATIVE
Hgb urine dipstick: NEGATIVE
Ketones, ur: NEGATIVE
Specific Gravity, Urine: 1.025 (ref 1.000–1.030)
Urine Glucose: NEGATIVE
Urobilinogen, UA: 0.2 (ref 0.0–1.0)

## 2012-12-02 LAB — CBC WITH DIFFERENTIAL/PLATELET
Basophils Absolute: 0.1 10*3/uL (ref 0.0–0.1)
Eosinophils Relative: 4.4 % (ref 0.0–5.0)
Hemoglobin: 14.7 g/dL (ref 13.0–17.0)
Lymphocytes Relative: 21.1 % (ref 12.0–46.0)
Lymphs Abs: 2 10*3/uL (ref 0.7–4.0)
Monocytes Absolute: 1.1 10*3/uL — ABNORMAL HIGH (ref 0.1–1.0)
Monocytes Relative: 11.6 % (ref 3.0–12.0)
Neutro Abs: 6 10*3/uL (ref 1.4–7.7)
RBC: 4.88 Mil/uL (ref 4.22–5.81)
RDW: 13.5 % (ref 11.5–14.6)
WBC: 9.7 10*3/uL (ref 4.5–10.5)

## 2012-12-02 LAB — MICROALBUMIN / CREATININE URINE RATIO
Creatinine,U: 115.1 mg/dL
Microalb Creat Ratio: 14.9 mg/g (ref 0.0–30.0)

## 2012-12-02 LAB — BRAIN NATRIURETIC PEPTIDE: Pro B Natriuretic peptide (BNP): 32 pg/mL (ref 0.0–100.0)

## 2012-12-02 MED ORDER — INSULIN LISPRO 100 UNIT/ML ~~LOC~~ SOLN: 50.0000 [IU] | Freq: Three times a day (TID) | SUBCUTANEOUS | Status: DC

## 2012-12-02 MED ORDER — INSULIN NPH (HUMAN) (ISOPHANE) 100 UNIT/ML ~~LOC~~ SUSP: 90.0000 [IU] | Freq: Every day | SUBCUTANEOUS | Status: DC

## 2012-12-02 MED ORDER — INSULIN LISPRO 100 UNIT/ML (KWIKPEN): 50.0000 [IU] | PEN_INJECTOR | Freq: Three times a day (TID) | SUBCUTANEOUS | Status: DC

## 2012-12-02 MED ORDER — INSULIN PEN NEEDLE 32G X 4 MM MISC: 1.0000 | Freq: Four times a day (QID) | Status: DC

## 2012-12-02 MED ORDER — TAMSULOSIN HCL 0.4 MG PO CAPS: 0.4000 mg | ORAL_CAPSULE | Freq: Every day | ORAL | Status: DC

## 2012-12-02 NOTE — Patient Instructions (Addendum)
Please come back soon for a "medicare wellness" appointment.  blood tests are being requested for you today.  We'll contact you with results.   check your blood sugar twice a day.  vary the time of day when you check, between before the 3 meals, and at bedtime.  also check if you have symptoms of your blood sugar being too high or too low.  please keep a record of the readings and bring it to your next appointment here.  You can write it on any piece of paper.  please call us sooner if your blood sugar goes below 70, or if you have a lot of readings over 200.

## 2012-12-02 NOTE — Progress Notes (Signed)
Subjective:    Patient ID: [name], male    DOB: 05-Feb-1940, 72 y.o.   MRN: [id number]  HPI Pt returns for f/u if insulin-requiring DM (dx'ed 1995; complicated by CAD and peripheral sensory.   neuropathy; he has never had severe hypoglycemia).  He says he has not recently checked cbg's.  He denies recent hypoglycemia Edema: he stopped lasix.  He has doe.   Past Medical History  Diagnosis Date  . Arthritis   . Hyperlipidemia   . Multinodular goiter   . Sleep apnea   . NASH (nonalcoholic steatohepatitis)   . DM retinopathy   . ED (erectile dysfunction)   . Colitis 1997    nonspecific  . Anemia, unspecified   . Diabetes mellitus     Type I  . DM peripheral angiopathy     Past Surgical History  Procedure Laterality Date  . Appendectomy    . Coronary artery bypass graft  2007    x 1  . Thoracotomy  2005    S/P Right Throactomy for Empyema  . Esophagogastroduodenoscopy  02/04/2003    normal    History   Social History  . Marital Status: Married    Spouse Name: N/A    Number of Children: N/A  . Years of Education: N/A   Occupational History  . Retired     laid off in 2010   Social History Main Topics  . Smoking status: Former Games developer  . Smokeless tobacco: Not on file  . Alcohol Use: Yes     Comment: rare  . Drug Use: No  . Sexual Activity:    Other Topics Concern  . Not on file   Social History Narrative   Regular exercise-swims once day   Daily Caffeine Use: 1-2 cups per day    Current Outpatient Prescriptions on File Prior to Visit  Medication Sig Dispense Refill  . allopurinol (ZYLOPRIM) 300 MG tablet Take 1 tablet (300 mg total) by mouth daily.  90 tablet  3  . aspirin (ASPIRIN ADULT LOW STRENGTH) 81 MG EC tablet Take 81 mg by mouth once.       . clotrimazole-betamethasone (LOTRISONE) cream Apply topically 2 (two) times daily.  90 g  3  . furosemide (LASIX) 40 MG tablet Take 1 tablet (40 mg total) by mouth daily.  90 tablet  0  .  HYDROcodone-acetaminophen (NORCO/VICODIN) 5-325 MG per tablet Take 1 tablet by mouth every 4 (four) hours as needed for pain. For pain  50 tablet  3  . Insulin Pen Needle 31G X 5 MM MISC Inject 1 each into the skin as needed.  600 each  1  . Multiple Vitamin (MULTIVITAMIN) tablet Take 1 tablet by mouth daily.        . simvastatin (ZOCOR) 40 MG tablet Take 1 tablet (40 mg total) by mouth daily.  90 tablet  3  . colchicine (COLCRYS) 0.6 MG tablet Take 0.6 mg by mouth every hour as needed. Not to exceed 6 per day        No current facility-administered medications on file prior to visit.    No Known Allergies  Family History  Problem Relation Age of Onset  . Crohn's disease Father   . Cancer Neg Hx     BP 130/82  Pulse 78  Temp(Src) 98.3 F (36.8 C)  Resp 12  Ht 6\' 3"  (1.905 m)  Wt 388 lb 8 oz (176.222 kg)  BMI 48.56 kg/m2  SpO2 97%  Review of Systems He has urinary frequency, but no dysuria.  He stopped flomax--did not help sxs.      Objective:   Physical Exam VITAL SIGNS:  See vs page.   GENERAL: no distress.     Lab Results  Component Value Date   WBC 9.7 12/02/2012   HGB 14.7 12/02/2012   HCT 43.7 12/02/2012   PLT 291.0 12/02/2012   GLUCOSE 106* 12/02/2012   CHOL 143 12/02/2012   TRIG 331.0* 12/02/2012   HDL 36.90* 12/02/2012   LDLDIRECT 65.4 12/02/2012   LDLCALC 54 10/13/2011   ALT 49 12/02/2012   AST 40* 12/02/2012   NA 138 12/02/2012   K 4.5 12/02/2012   CL 102 12/02/2012   CREATININE 1.0 12/02/2012   BUN 17 12/02/2012   CO2 30 12/02/2012   TSH 2.69 12/02/2012   PSA 0.34 10/13/2011   INR 1.1 09/17/2006   HGBA1C 8.8* 12/02/2012   MICROALBUR 17.2* 12/02/2012  BNP: normal    Assessment & Plan:  DM: This insulin regimen was chosen from multiple options, as it best matches his insulin to his changing requirements throughout the day.  The benefits of glycemic control must be weighed against the risks of hypoglycemia.  He may need a simpler schedule.  He needs increased  rx Edema: therapy limited by noncompliance.  i'll do the best i can.   Dyslipidemia: improved DM control will help.

## 2012-12-03 LAB — BASIC METABOLIC PANEL
BUN: 17 mg/dL (ref 6–23)
CO2: 30 mEq/L (ref 19–32)
Calcium: 9.6 mg/dL (ref 8.4–10.5)
Creatinine, Ser: 1 mg/dL (ref 0.4–1.5)
GFR: 79.18 mL/min (ref >60.00)
Glucose, Bld: 106 mg/dL — ABNORMAL HIGH (ref 70–99)
Sodium: 138 mEq/L (ref 135–145)

## 2012-12-03 LAB — TSH: TSH: 2.69 u[IU]/mL (ref 0.35–5.50)

## 2012-12-03 LAB — IBC PANEL
Iron: 100 ug/dL (ref 42–165)
Saturation Ratios: 26.6 % (ref 20.0–50.0)

## 2012-12-03 LAB — LIPID PANEL
HDL: 36.9 mg/dL — ABNORMAL LOW (ref >39.00)
Total CHOL/HDL Ratio: 4
VLDL: 66.2 mg/dL — ABNORMAL HIGH (ref 0.0–40.0)

## 2012-12-03 LAB — LDL CHOLESTEROL, DIRECT: Direct LDL: 65.4 mg/dL

## 2012-12-03 LAB — HEPATIC FUNCTION PANEL
Albumin: 3.7 g/dL (ref 3.5–5.2)
Total Bilirubin: 0.4 mg/dL (ref 0.3–1.2)

## 2012-12-03 LAB — URIC ACID: Uric Acid, Serum: 5.9 mg/dL (ref 4.0–7.8)

## 2012-12-04 DIAGNOSIS — S52123A Displaced fracture of head of unspecified radius, initial encounter for closed fracture: Secondary | ICD-10-CM | POA: Diagnosis not present

## 2012-12-05 ENCOUNTER — Other Ambulatory Visit: Payer: Self-pay | Admitting: *Deleted

## 2012-12-05 ENCOUNTER — Other Ambulatory Visit: Payer: Self-pay

## 2012-12-05 MED ORDER — INSULIN PEN NEEDLE 31G X 5 MM MISC: Status: DC

## 2012-12-05 MED ORDER — INSULIN PEN NEEDLE 31G X 8 MM MISC: Status: DC

## 2012-12-05 MED ORDER — INSULIN PEN NEEDLE 32G X 4 MM MISC: 1.0000 | Freq: Four times a day (QID) | Status: DC

## 2012-12-05 NOTE — Telephone Encounter (Signed)
Pt's rx was incorrect. Re-sending correct rx for pen needles.

## 2012-12-06 ENCOUNTER — Other Ambulatory Visit: Payer: Self-pay | Admitting: *Deleted

## 2012-12-06 ENCOUNTER — Telehealth: Payer: Self-pay | Admitting: *Deleted

## 2012-12-06 MED ORDER — INSULIN PEN NEEDLE 31G X 8 MM MISC: Status: DC

## 2012-12-06 NOTE — Telephone Encounter (Signed)
Patient left vm inquiring about one of his rx's that was not sent in correctly. Rx has been resent and patient notified. Left vm.

## 2012-12-06 NOTE — Telephone Encounter (Signed)
Directions not correct on pen needles. Re-sending.

## 2012-12-09 DIAGNOSIS — R300 Dysuria: Secondary | ICD-10-CM | POA: Diagnosis not present

## 2012-12-09 DIAGNOSIS — R350 Frequency of micturition: Secondary | ICD-10-CM | POA: Diagnosis not present

## 2012-12-17 ENCOUNTER — Encounter: Payer: Self-pay | Admitting: Endocrinology

## 2012-12-17 ENCOUNTER — Ambulatory Visit (INDEPENDENT_AMBULATORY_CARE_PROVIDER_SITE_OTHER): Payer: Medicare Other | Admitting: Endocrinology

## 2012-12-17 VITALS — BP 150/70 | HR 112 | Temp 97.7°F | Resp 28 | Ht 75.5 in | Wt 387.4 lb

## 2012-12-17 DIAGNOSIS — E119 Type 2 diabetes mellitus without complications: Secondary | ICD-10-CM

## 2012-12-17 DIAGNOSIS — Z23 Encounter for immunization: Secondary | ICD-10-CM

## 2012-12-17 DIAGNOSIS — I10 Essential (primary) hypertension: Secondary | ICD-10-CM

## 2012-12-17 DIAGNOSIS — Z0000 Encounter for general adult medical examination without abnormal findings: Secondary | ICD-10-CM | POA: Diagnosis not present

## 2012-12-17 LAB — EKG 12-LEAD

## 2012-12-17 NOTE — Patient Instructions (Addendum)
please consider these measures for your health:  minimize alcohol.  do not use tobacco products.  have a colonoscopy at least every 10 years from age 72. keep firearms safely stored.  always use seat belts.  have working smoke alarms in your home.  see an eye doctor and dentist regularly.  never drive under the influence of alcohol or drugs (including prescription drugs).  those with fair skin should take precautions against the sun. You should have a vaccine against shingles (a painful rash which results from the  chickenpox infection which most people had many years ago).  This vaccine reduces, but does not totally eliminate the risk of shingles.  Because this is a medicare part d benefit, you should get it at a pharmacy.   Refer to a weight-loss surgery specialist.  you will receive a phone call, about a day and time for an informational meeting.   Please come back for a follow-up appointment in January.

## 2012-12-17 NOTE — Progress Notes (Signed)
Subjective:    Patient ID: [name], male    DOB: 1940-09-24, 72 y.o.   MRN: [id number]  HPI Pt is here for regular wellness examination, and is feeling pretty well in general, and says chronic med probs are stable, except as noted below Past Medical History  Diagnosis Date  . Arthritis   . Hyperlipidemia   . Multinodular goiter   . Sleep apnea   . NASH (nonalcoholic steatohepatitis)   . DM retinopathy   . ED (erectile dysfunction)   . Colitis 1997    nonspecific  . Anemia, unspecified   . Diabetes mellitus     Type I  . DM peripheral angiopathy     Past Surgical History  Procedure Laterality Date  . Appendectomy    . Coronary artery bypass graft  2007    x 1  . Thoracotomy  2005    S/P Right Throactomy for Empyema  . Esophagogastroduodenoscopy  02/04/2003    normal    History   Social History  . Marital Status: Married    Spouse Name: N/A    Number of Children: N/A  . Years of Education: N/A   Occupational History  . Retired     laid off in 2010   Social History Main Topics  . Smoking status: Former Games developer  . Smokeless tobacco: Not on file  . Alcohol Use: Yes     Comment: rare  . Drug Use: No  . Sexual Activity:    Other Topics Concern  . Not on file   Social History Narrative   Regular exercise-swims once day   Daily Caffeine Use: 1-2 cups per day    Current Outpatient Prescriptions on File Prior to Visit  Medication Sig Dispense Refill  . allopurinol (ZYLOPRIM) 300 MG tablet Take 1 tablet (300 mg total) by mouth daily.  90 tablet  3  . aspirin (ASPIRIN ADULT LOW STRENGTH) 81 MG EC tablet Take 81 mg by mouth once.       . cholecalciferol (VITAMIN D) 1000 UNITS tablet Take 2,500 Units by mouth daily. Vitamin D3      . clotrimazole-betamethasone (LOTRISONE) cream Apply topically 2 (two) times daily.  90 g  3  . furosemide (LASIX) 40 MG tablet Take 1 tablet (40 mg total) by mouth daily.  90 tablet  0  . HYDROcodone-acetaminophen  (NORCO/VICODIN) 5-325 MG per tablet Take 1 tablet by mouth every 4 (four) hours as needed for pain. For pain  50 tablet  3  . insulin lispro (HUMALOG) 100 UNIT/ML injection Inject 50 Units into the skin 3 (three) times daily before meals.  45 mL  3  . insulin NPH (HUMULIN N,NOVOLIN N) 100 UNIT/ML injection Inject 90 Units into the skin at bedtime.  30 mL  11  . Insulin Pen Needle (BD PEN NEEDLE NANO U/F) 32G X 4 MM MISC 1 Device by Does not apply route 4 (four) times daily.  200 each  3  . Insulin Pen Needle 31G X 8 MM MISC Use 3 times daily as instructed.  100 each  3  . Multiple Vitamin (MULTIVITAMIN) tablet Take 1 tablet by mouth daily.        . simvastatin (ZOCOR) 40 MG tablet Take 1 tablet (40 mg total) by mouth daily.  90 tablet  3  . tamsulosin (FLOMAX) 0.4 MG CAPS capsule Take 1 capsule (0.4 mg total) by mouth daily.  90 capsule  3   No current facility-administered medications on file prior  to visit.    No Known Allergies  Family History  Problem Relation Age of Onset  . Crohn's disease Father   . Cancer Neg Hx     BP 150/70  Pulse 112  Temp(Src) 97.7 F (36.5 C) (Oral)  Resp 28  Ht 6' 3.5" (1.918 m)  Wt 387 lb 6.4 oz (175.723 kg)  BMI 47.77 kg/m2  SpO2 95%   Review of Systems  Constitutional: Negative for fever and unexpected weight change.  HENT: Negative for hearing loss.   Eyes: Negative for visual disturbance.  Respiratory:       No change in chronic doe  Cardiovascular: Negative for chest pain.  Gastrointestinal: Negative for blood in stool.  Endocrine: Negative for cold intolerance.  Genitourinary: Negative for hematuria.  Musculoskeletal: Negative for back pain.  Skin: Negative for wound.  Allergic/Immunologic: Positive for environmental allergies.  Neurological: Negative for syncope and numbness.  Hematological: Does not bruise/bleed easily.  Psychiatric/Behavioral: Negative for dysphoric mood.       Objective:   Physical Exam VS: see vs  page GEN: no distress.  Morbid obesity.   HEAD: head: no deformity eyes: no periorbital swelling, no proptosis external nose and ears are normal mouth: no lesion seen NECK: supple, thyroid is not enlarged CHEST WALL: no deformity LUNGS: clear to auscultation. BREASTS:  No gynecomastia. CV: reg rate and rhythm, no murmur. ABD: abdomen is soft, nontender.  no hepatosplenomegaly.  not distended.  no hernia GENITALIA/RECTAL/PROSTATE: sees urology.   MUSCULOSKELETAL: muscle bulk and strength are grossly normal.  no obvious joint swelling.  gait is normal and steady EXTEMITIES: declined PULSES: no carotid bruit NEURO:  cn 2-12 grossly intact.   readily moves all 4's.   SKIN:  Normal texture and temperature.  No rash or suspicious lesion is visible.   NODES:  None palpable at the neck PSYCH: alert, oriented x3.  Does not appear anxious nor depressed.       Assessment & Plan:  Wellness visit today, with problems stable, except as noted. we discussed code status.  pt requests full code, but would not want to be started or maintained on artificial life-support measures if there was not a reasonable chance of recovery

## 2012-12-19 DIAGNOSIS — Z139 Encounter for screening, unspecified: Secondary | ICD-10-CM | POA: Insufficient documentation

## 2013-02-04 ENCOUNTER — Telehealth: Payer: Self-pay

## 2013-02-04 NOTE — Telephone Encounter (Signed)
Sorry, i still cannot addend the form

## 2013-02-04 NOTE — Telephone Encounter (Signed)
Called Ginger at [employer] and informed that addendum would not be added to the note. Ginger stated that form need to be completed (addedum) need to be completed for pt insurance to cover. Addendum needs to say pt have one of the following problems.   Please advise, Thanks!

## 2013-02-05 NOTE — Telephone Encounter (Signed)
Notified Bunk Foss stating that addendum would not be done.

## 2013-02-18 ENCOUNTER — Ambulatory Visit: Payer: Medicare Other | Admitting: Endocrinology

## 2013-02-23 ENCOUNTER — Other Ambulatory Visit: Payer: Self-pay | Admitting: Endocrinology

## 2013-04-07 ENCOUNTER — Other Ambulatory Visit: Payer: Self-pay

## 2013-04-07 MED ORDER — ALLOPURINOL 300 MG PO TABS: 300.0000 mg | ORAL_TABLET | Freq: Every day | ORAL | Status: DC

## 2013-04-22 ENCOUNTER — Other Ambulatory Visit: Payer: Self-pay | Admitting: Endocrinology

## 2013-05-09 ENCOUNTER — Other Ambulatory Visit: Payer: Self-pay | Admitting: *Deleted

## 2013-05-09 MED ORDER — SIMVASTATIN 40 MG PO TABS: 40.0000 mg | ORAL_TABLET | Freq: Every day | ORAL | Status: DC

## 2013-05-15 ENCOUNTER — Telehealth: Payer: Self-pay

## 2013-05-15 ENCOUNTER — Other Ambulatory Visit: Payer: Self-pay

## 2013-05-15 MED ORDER — INSULIN PEN NEEDLE 29G X 12MM MISC: Status: DC

## 2013-05-15 NOTE — Telephone Encounter (Signed)
Pt informed that [employer] parking sticker form is completed. Per Pt's request form mailed.

## 2013-07-28 ENCOUNTER — Telehealth: Payer: Self-pay | Admitting: Endocrinology

## 2013-07-28 MED ORDER — ALLOPURINOL 300 MG PO TABS: 300.0000 mg | ORAL_TABLET | Freq: Every day | ORAL | Status: DC

## 2013-07-28 NOTE — Telephone Encounter (Signed)
Patient states he needs a refill on his allopurinol 300mg    Please send to [phone] Patient has an [facility]  He is in donut hole [facility] not [facility]!!!  Thank You :)

## 2013-07-28 NOTE — Telephone Encounter (Signed)
Rx sent to pharmacy   

## 2013-08-07 DIAGNOSIS — R3915 Urgency of urination: Secondary | ICD-10-CM | POA: Diagnosis not present

## 2013-08-07 DIAGNOSIS — R3989 Other symptoms and signs involving the genitourinary system: Secondary | ICD-10-CM | POA: Diagnosis not present

## 2013-08-07 DIAGNOSIS — N498 Inflammatory disorders of other specified male genital organs: Secondary | ICD-10-CM | POA: Diagnosis not present

## 2013-08-14 ENCOUNTER — Telehealth: Payer: Self-pay | Admitting: Endocrinology

## 2013-08-14 MED ORDER — INSULIN LISPRO 100 UNIT/ML (KWIKPEN): PEN_INJECTOR | SUBCUTANEOUS | Status: DC

## 2013-08-14 NOTE — Telephone Encounter (Signed)
Pt also wants a new [facility] for the humulin n qwik pen 90 day supply

## 2013-08-14 NOTE — Telephone Encounter (Signed)
Rx sent to pharmacy   

## 2013-08-14 NOTE — Telephone Encounter (Signed)
Pt needs one box with no refills of the Humalog qwik pen injectable

## 2013-08-15 ENCOUNTER — Encounter: Payer: Self-pay | Admitting: Gastroenterology

## 2013-08-19 ENCOUNTER — Telehealth: Payer: Self-pay | Admitting: *Deleted

## 2013-08-19 ENCOUNTER — Telehealth: Payer: Self-pay

## 2013-08-19 MED ORDER — INSULIN NPH (HUMAN) (ISOPHANE) 100 UNIT/ML ~~LOC~~ SUSP: 90.0000 [IU] | Freq: Every day | SUBCUTANEOUS | Status: DC

## 2013-08-19 MED ORDER — SIMVASTATIN 40 MG PO TABS: 40.0000 mg | ORAL_TABLET | Freq: Every day | ORAL | Status: DC

## 2013-08-19 NOTE — Telephone Encounter (Signed)
Called pt and spoke with pt. Pt advised that rx for for Simvastatin and Humulin pen were sent to pharmacy.

## 2013-08-19 NOTE — Telephone Encounter (Signed)
Error

## 2013-08-19 NOTE — Telephone Encounter (Signed)
Patient really really needs to speak to you about his prescriptions, please return his call. [phone]

## 2013-08-20 MED ORDER — INSULIN ISOPHANE HUMAN 100 UNIT/ML KWIKPEN: PEN_INJECTOR | SUBCUTANEOUS | Status: DC

## 2013-08-20 NOTE — Telephone Encounter (Signed)
Patient stated that he needed 2 boxes of 5 pins each for humilin and Kwik Pins for a month,  6 boxes for 90 days, he has question about his prescriptions, he asked if you would call.  Thank you

## 2013-08-20 NOTE — Telephone Encounter (Signed)
Pt advised that rx sent to pharmacy.

## 2013-08-28 ENCOUNTER — Telehealth: Payer: Self-pay | Admitting: Endocrinology

## 2013-08-28 NOTE — Telephone Encounter (Signed)
Patient asked if you would call him, he said it was very important that you call.

## 2013-08-28 NOTE — Telephone Encounter (Signed)
Called pt and advised that a 90 day supply of the Humalog pen had been sent into the pharmacy with 1 refill.

## 2013-09-09 ENCOUNTER — Ambulatory Visit: Payer: Medicare Other | Admitting: Endocrinology

## 2013-09-24 ENCOUNTER — Ambulatory Visit (INDEPENDENT_AMBULATORY_CARE_PROVIDER_SITE_OTHER): Payer: Medicare Other | Admitting: Endocrinology

## 2013-09-24 ENCOUNTER — Encounter: Payer: Self-pay | Admitting: Endocrinology

## 2013-09-24 VITALS — BP 132/74 | HR 87 | Temp 97.5°F | Ht 75.5 in | Wt 394.0 lb

## 2013-09-24 DIAGNOSIS — R0600 Dyspnea, unspecified: Secondary | ICD-10-CM

## 2013-09-24 DIAGNOSIS — E1049 Type 1 diabetes mellitus with other diabetic neurological complication: Secondary | ICD-10-CM

## 2013-09-24 DIAGNOSIS — R079 Chest pain, unspecified: Secondary | ICD-10-CM | POA: Diagnosis not present

## 2013-09-24 DIAGNOSIS — R0609 Other forms of dyspnea: Secondary | ICD-10-CM | POA: Diagnosis not present

## 2013-09-24 DIAGNOSIS — R0989 Other specified symptoms and signs involving the circulatory and respiratory systems: Secondary | ICD-10-CM

## 2013-09-24 LAB — BASIC METABOLIC PANEL
BUN: 23 mg/dL (ref 6–23)
CHLORIDE: 107 meq/L (ref 96–112)
CO2: 26 meq/L (ref 19–32)
CREATININE: 1.2 mg/dL (ref 0.4–1.5)
Calcium: 9.3 mg/dL (ref 8.4–10.5)
GFR: 65.79 mL/min (ref >60.00)
Glucose, Bld: 215 mg/dL — ABNORMAL HIGH (ref 70–99)
POTASSIUM: 3.9 meq/L (ref 3.5–5.1)
SODIUM: 145 meq/L (ref 135–145)

## 2013-09-24 LAB — BRAIN NATRIURETIC PEPTIDE: PRO B NATRI PEPTIDE: 15 pg/mL (ref 0.0–100.0)

## 2013-09-24 LAB — HEMOGLOBIN A1C: HEMOGLOBIN A1C: 10.5 % — AB (ref 4.6–6.5)

## 2013-09-24 NOTE — Progress Notes (Signed)
Subjective:    Patient ID: [name], male    DOB: 1940/07/20, 73 y.o.   MRN: [id number]  HPI Pt states 1 year of intermittent mild nonexertional pain at the upper left chest, lasting approx 1 minute at a time.  It is relieved by local pressure.   He resumed lasix just 1 week ago, after not taking it for many months.  He has doe after just 30 feet.   Pt returns for f/u of insulin-requiring DM (dx'ed 7829; complicated by CAD and peripheral sensory.   neuropathy; he has never had severe hypoglycemia).  no cbg record. Past Medical History  Diagnosis Date  . Arthritis   . Hyperlipidemia   . Multinodular goiter   . Sleep apnea   . NASH (nonalcoholic steatohepatitis)   . DM retinopathy   . ED (erectile dysfunction)   . Colitis 1997    nonspecific  . Anemia, unspecified   . Diabetes mellitus     Type I  . DM peripheral angiopathy     Past Surgical History  Procedure Laterality Date  . Appendectomy    . Coronary artery bypass graft  2007    x 1  . Thoracotomy  2005    S/P Right Throactomy for Empyema  . Esophagogastroduodenoscopy  02/04/2003    normal    History   Social History  . Marital Status: Married    Spouse Name: N/A    Number of Children: N/A  . Years of Education: N/A   Occupational History  . Retired     laid off in 2010   Social History Main Topics  . Smoking status: Former Research scientist (life sciences)  . Smokeless tobacco: Not on file  . Alcohol Use: Yes     Comment: rare  . Drug Use: No  . Sexual Activity:    Other Topics Concern  . Not on file   Social History Narrative   Regular exercise-swims once day   Daily Caffeine Use: 1-2 cups per day    Current Outpatient Prescriptions on File Prior to Visit  Medication Sig Dispense Refill  . allopurinol (ZYLOPRIM) 300 MG tablet Take 1 tablet (300 mg total) by mouth daily.  30 tablet  0  . aspirin (ASPIRIN ADULT LOW STRENGTH) 81 MG EC tablet Take 81 mg by mouth once.       . cholecalciferol (VITAMIN D) 1000 UNITS  tablet Take 2,500 Units by mouth daily. Vitamin D3      . ciprofloxacin (CIPRO) 500 MG tablet Take 500 mg by mouth 2 (two) times daily.      . clotrimazole-betamethasone (LOTRISONE) cream Apply topically 2 (two) times daily.  90 g  3  . HYDROcodone-acetaminophen (NORCO/VICODIN) 5-325 MG per tablet Take 1 tablet by mouth every 4 (four) hours as needed for pain. For pain  50 tablet  3  . insulin NPH Human (HUMULIN N,NOVOLIN N) 100 UNIT/ML injection Inject 0.9 mLs (90 Units total) into the skin at bedtime.  30 mL  11  . Insulin NPH, Human,, Isophane, (HUMULIN N KWIKPEN) 100 UNIT/ML Kiwkpen Inject 90 units into the skin at bedtime.  30 pen  2  . Insulin Pen Needle 29G X 12MM MISC Use 1 as directed to inject as needed.  100 each  2  . Multiple Vitamin (MULTIVITAMIN) tablet Take 1 tablet by mouth daily.        . simvastatin (ZOCOR) 40 MG tablet Take 1 tablet (40 mg total) by mouth daily.  90 tablet  1  .  tamsulosin (FLOMAX) 0.4 MG CAPS capsule Take 1 capsule (0.4 mg total) by mouth daily.  90 capsule  3  . furosemide (LASIX) 40 MG tablet Take 1 tablet (40 mg total) by mouth daily.  90 tablet  0  . simvastatin (ZOCOR) 40 MG tablet Take 1 tablet (40 mg total) by mouth daily.  90 tablet  3   No current facility-administered medications on file prior to visit.    No Known Allergies  Family History  Problem Relation Age of Onset  . Crohn's disease Father   . Cancer Neg Hx     BP 132/74  Pulse 87  Temp(Src) 97.5 F (36.4 C) (Oral)  Ht 6' 3.5" (1.918 m)  Wt 394 lb (178.717 kg)  BMI 48.58 kg/m2  SpO2 96%  Review of Systems He denies hypoglycemia and n/v.     Objective:   Physical Exam VITAL SIGNS:  See vs page GENERAL: no distress LUNGS:  Clear to auscultation Chest wall: nontender Pulses: dorsalis pedis intact bilat.  Feet: no deformity. 2+ bilat leg edema. There is bilateral onychomycosis; there is hyperpigmentation and erythema of the anterior tibial areas.  Skin: no ulcer on the  feet. feet are of normal color and temp.  Neuro: sensation is intact to touch on the feet, but decreased from normal.     BNP=normal Lab Results  Component Value Date   HGBA1C 10.5* 09/24/2013   Lab Results  Component Value Date   CREATININE 1.2 09/24/2013   BUN 23 09/24/2013   NA 145 09/24/2013   K 3.9 09/24/2013   CL 107 09/24/2013   CO2 26 09/24/2013      Assessment & Plan:  CHF: stable DM: severe exacerbation Chest pain, new, sounds noncardiogenic.     Patient is advised the following: Patient Instructions  Let's check a heart test.  you will receive a phone call, about a day and time for an appointment. check your blood sugar twice a day.  vary the time of day when you check, between before the 3 meals, and at bedtime.  also check if you have symptoms of your blood sugar being too high or too low.  please keep a record of the readings and bring it to your next appointment here.  You can write it on any piece of paper.  please call us sooner if your blood sugar goes below 70, or if you have a lot of readings over 200.   blood tests are being requested for you today.  We'll contact you with results.   Please come back for a follow-up appointment in 1 month.    increase humalog to 75 units 3 times a day (just before each meal)

## 2013-09-24 NOTE — Patient Instructions (Addendum)
Let's check a heart test.  you will receive a phone call, about a day and time for an appointment. check your blood sugar twice a day.  vary the time of day when you check, between before the 3 meals, and at bedtime.  also check if you have symptoms of your blood sugar being too high or too low.  please keep a record of the readings and bring it to your next appointment here.  You can write it on any piece of paper.  please call us sooner if your blood sugar goes below 70, or if you have a lot of readings over 200.   blood tests are being requested for you today.  We'll contact you with results.   Please come back for a follow-up appointment in 1 month.

## 2013-09-25 ENCOUNTER — Other Ambulatory Visit: Payer: Self-pay

## 2013-09-25 MED ORDER — INSULIN LISPRO 100 UNIT/ML (KWIKPEN): PEN_INJECTOR | SUBCUTANEOUS | Status: DC

## 2013-09-27 ENCOUNTER — Other Ambulatory Visit: Payer: Self-pay | Admitting: Endocrinology

## 2013-10-17 ENCOUNTER — Ambulatory Visit (HOSPITAL_COMMUNITY): Payer: Medicare Other | Attending: Endocrinology | Admitting: Radiology

## 2013-10-17 VITALS — BP 121/64 | HR 73 | Ht 76.0 in | Wt 393.0 lb

## 2013-10-17 DIAGNOSIS — E785 Hyperlipidemia, unspecified: Secondary | ICD-10-CM | POA: Insufficient documentation

## 2013-10-17 DIAGNOSIS — E119 Type 2 diabetes mellitus without complications: Secondary | ICD-10-CM | POA: Diagnosis not present

## 2013-10-17 DIAGNOSIS — I2510 Atherosclerotic heart disease of native coronary artery without angina pectoris: Secondary | ICD-10-CM | POA: Diagnosis not present

## 2013-10-17 DIAGNOSIS — Z951 Presence of aortocoronary bypass graft: Secondary | ICD-10-CM | POA: Diagnosis not present

## 2013-10-17 DIAGNOSIS — I10 Essential (primary) hypertension: Secondary | ICD-10-CM | POA: Diagnosis not present

## 2013-10-17 DIAGNOSIS — Z794 Long term (current) use of insulin: Secondary | ICD-10-CM | POA: Insufficient documentation

## 2013-10-17 DIAGNOSIS — Z7982 Long term (current) use of aspirin: Secondary | ICD-10-CM | POA: Insufficient documentation

## 2013-10-17 DIAGNOSIS — R0609 Other forms of dyspnea: Secondary | ICD-10-CM | POA: Diagnosis not present

## 2013-10-17 DIAGNOSIS — Z79899 Other long term (current) drug therapy: Secondary | ICD-10-CM | POA: Insufficient documentation

## 2013-10-17 DIAGNOSIS — I509 Heart failure, unspecified: Secondary | ICD-10-CM | POA: Diagnosis not present

## 2013-10-17 DIAGNOSIS — R0989 Other specified symptoms and signs involving the circulatory and respiratory systems: Secondary | ICD-10-CM | POA: Insufficient documentation

## 2013-10-17 DIAGNOSIS — R079 Chest pain, unspecified: Secondary | ICD-10-CM

## 2013-10-17 MED ORDER — TECHNETIUM TC 99M SESTAMIBI GENERIC - CARDIOLITE
33.0000 | Freq: Once | INTRAVENOUS | Status: AC | PRN
  Administered 2013-10-17: 33 via INTRAVENOUS

## 2013-10-17 MED ORDER — REGADENOSON 0.4 MG/5ML IV SOLN
0.4000 mg | Freq: Once | INTRAVENOUS | Status: AC
  Administered 2013-10-17: 0.4 mg via INTRAVENOUS

## 2013-10-20 ENCOUNTER — Ambulatory Visit (HOSPITAL_COMMUNITY): Payer: Medicare Other | Attending: Cardiology | Admitting: Radiology

## 2013-10-20 VITALS — BP 121/64 | HR 73 | Ht 76.0 in | Wt 373.0 lb

## 2013-10-20 DIAGNOSIS — E119 Type 2 diabetes mellitus without complications: Secondary | ICD-10-CM | POA: Insufficient documentation

## 2013-10-20 DIAGNOSIS — R079 Chest pain, unspecified: Secondary | ICD-10-CM | POA: Diagnosis not present

## 2013-10-20 DIAGNOSIS — R0989 Other specified symptoms and signs involving the circulatory and respiratory systems: Secondary | ICD-10-CM | POA: Diagnosis present

## 2013-10-20 DIAGNOSIS — E669 Obesity, unspecified: Secondary | ICD-10-CM | POA: Diagnosis not present

## 2013-10-20 DIAGNOSIS — R0609 Other forms of dyspnea: Secondary | ICD-10-CM | POA: Insufficient documentation

## 2013-10-20 MED ORDER — TECHNETIUM TC 99M SESTAMIBI GENERIC - CARDIOLITE
30.0000 | Freq: Once | INTRAVENOUS | Status: AC | PRN
  Administered 2013-10-20: 30 via INTRAVENOUS

## 2013-10-20 NOTE — Progress Notes (Signed)
[facility] [address] [place], [place] [address] [phone]    Cardiology Nuclear Med Study  [name] is a 73 y.o. male     MRN : [id number]     DOB: 07/06/1940  Procedure Date: 10/20/2013  Nuclear Med Background Indication for Stress Test:  Evaluation for Ischemia, Graft Patency  History:  CAD, MPI 2010 ([facility]) EF 71% Cardiac Risk Factors: IDDM Type 2 and Obesity  Symptoms:  Chest Pain (last date of chest discomfort was two weeks ago) and DOE   Nuclear Pre-Procedure Caffeine/Decaff Intake:  None NPO After: 6:00 pm   Lungs:  clear O2 Sat: 95% on room air. IV 0.9% NS with Angio Cath:  22g  IV Site: R Hand  IV Started by:  Annye Rusk, CNMT  Chest Size (in):  62 Cup Size: n/a  Height: 6\' 4"  (1.93 m)  Weight:  373 lb (169.192 kg)  BMI:  Body mass index is 45.42 kg/(m^2). Tech Comments:  n/a    Nuclear Med Study 1 or 2 day study: 2 day  Stress Test Type:  Lexiscan  Reading MD: Dola Argyle, MD  Order Authorizing Provider:  Renato Shin, MD  Resting Radionuclide: Technetium 63m Sestamibi  Resting Radionuclide Dose: 33.0 mCi on 10/20/13   Stress Radionuclide:  Technetium 52m Sestamibi  Stress Radionuclide Dose: 33.0 mCi on 10/17/13           Stress Protocol Rest HR: 73 Stress HR: 81  Rest BP: 121/64 Stress BP: 167/91  Exercise Time (min): n/a METS: n/a           Dose of Adenosine (mg):  n/a Dose of Lexiscan: 0.4 mg  Dose of Atropine (mg): n/a Dose of Dobutamine: n/a mcg/kg/min (at max HR)  Stress Test Technologist: Glade Lloyd, BS-ES  Nuclear Technologist:  Earl Many, CNMT     Rest Procedure:  Myocardial perfusion imaging was performed at rest 45 minutes following the intravenous administration of Technetium 4m Sestamibi. Rest ECG: Normal sinus rhythm. Diffuse mild nonspecific ST-T wave changes.  Stress Procedure:  The patient received IV Lexiscan 0.4 mg over 15-seconds.  Technetium 20m Sestamibi injected at 30-seconds.   Quantitative spect images were obtained after a 45 minute delay. Stress ECG: No significant change from baseline ECG  QPS Raw Data Images:  Normal; no motion artifact; normal heart/lung ratio. Stress Images:  There are small scattered areas of decreased uptake. These include the mid and basal inferior segments, the apical inferior segment, the apical anterior segment, and the mid inferolateral segment. Rest Images:  Rest images are the same as the stress images. Subtraction (SDS):  No evidence of ischemia. Transient Ischemic Dilatation (Normal <1.22):  0.82 Lung/Heart Ratio (Normal <0.45):  0.26  Quantitative Gated Spect Images QGS EDV:  107 ml QGS ESV:  43 ml  Impression Exercise Capacity:  Lexiscan with no exercise. BP Response:  Normal blood pressure response. Clinical Symptoms:  No significant symptoms noted. ECG Impression:  No significant ST segment change suggestive of ischemia. Comparison with Prior Nuclear Study: Study is compared with the report of the study from August, 2010  Overall Impression:  There no diagnostic abnormalities. There are scattered areas of mild decreased uptake. This was described in the prior study. There is no definite scar or ischemia. Wall motion is normal. This is a low risk scan.  LV Ejection Fraction: 59%.  LV Wall Motion:  Normal Wall Motion.  Dola Argyle , MD

## 2013-10-21 ENCOUNTER — Encounter: Payer: Self-pay | Admitting: Internal Medicine

## 2013-10-27 ENCOUNTER — Ambulatory Visit (INDEPENDENT_AMBULATORY_CARE_PROVIDER_SITE_OTHER): Payer: Medicare Other | Admitting: Endocrinology

## 2013-10-27 ENCOUNTER — Encounter: Payer: Self-pay | Admitting: Endocrinology

## 2013-10-27 VITALS — BP 122/54 | HR 82 | Temp 97.5°F | Ht 76.0 in | Wt 392.0 lb

## 2013-10-27 DIAGNOSIS — E1049 Type 1 diabetes mellitus with other diabetic neurological complication: Secondary | ICD-10-CM | POA: Diagnosis not present

## 2013-10-27 DIAGNOSIS — G4733 Obstructive sleep apnea (adult) (pediatric): Secondary | ICD-10-CM | POA: Diagnosis not present

## 2013-10-27 DIAGNOSIS — Z23 Encounter for immunization: Secondary | ICD-10-CM | POA: Diagnosis not present

## 2013-10-27 DIAGNOSIS — Z0000 Encounter for general adult medical examination without abnormal findings: Secondary | ICD-10-CM | POA: Diagnosis not present

## 2013-10-27 MED ORDER — FUROSEMIDE 40 MG PO TABS: 40.0000 mg | ORAL_TABLET | Freq: Every day | ORAL | Status: DC

## 2013-10-27 NOTE — Patient Instructions (Addendum)
check your blood sugar twice a day.  vary the time of day when you check, between before the 3 meals, and at bedtime.  also check if you have symptoms of your blood sugar being too high or too low.  please keep a record of the readings and bring it to your next appointment here.  You can write it on any piece of paper.  please call us sooner if your blood sugar goes below 70, or if you have a lot of readings over 200.   Please let me know if you reconsider the weight-loss surgery.   Please come back for a regular physical appointment in 2 months (must be after 12/17/13).   Please call next week to let me know how the blood sugar is.

## 2013-10-27 NOTE — Progress Notes (Signed)
Subjective:    Patient ID: [name], male    DOB: February 21, 1940, 73 y.o.   MRN: [id number]  HPI Pt returns for f/u of diabetes mellitus:  DM type: insulin-requiring type 2. Dx'ed: 2035 Complications: CAD and peripheral sensory neuropathy Therapy: insulin since approx 2004 DKA: never. Severe hypoglycemia: never Pancreatitis: never Other info: he takes multiple daily injections. He declines weight-loss surgery.    Interval history: he just increased the humalog a day or 2 ago.   He hasn't recently checked cbg's.  Past Medical History  Diagnosis Date  . Arthritis   . Hyperlipidemia   . Multinodular goiter   . Sleep apnea   . NASH (nonalcoholic steatohepatitis)   . DM retinopathy   . ED (erectile dysfunction)   . Colitis 1997    nonspecific  . Anemia, unspecified   . Diabetes mellitus     Type I  . DM peripheral angiopathy     Past Surgical History  Procedure Laterality Date  . Appendectomy    . Coronary artery bypass graft  2007    x 1  . Thoracotomy  2005    S/P Right Throactomy for Empyema  . Esophagogastroduodenoscopy  02/04/2003    normal    History   Social History  . Marital Status: Married    Spouse Name: N/A    Number of Children: N/A  . Years of Education: N/A   Occupational History  . Retired     laid off in 2010   Social History Main Topics  . Smoking status: Former Research scientist (life sciences)  . Smokeless tobacco: Not on file  . Alcohol Use: Yes     Comment: rare  . Drug Use: No  . Sexual Activity:    Other Topics Concern  . Not on file   Social History Narrative   Regular exercise-swims once day   Daily Caffeine Use: 1-2 cups per day    Current Outpatient Prescriptions on File Prior to Visit  Medication Sig Dispense Refill  . allopurinol (ZYLOPRIM) 300 MG tablet Take 1 tablet (300 mg total) by mouth daily.  30 tablet  0  . aspirin (ASPIRIN ADULT LOW STRENGTH) 81 MG EC tablet Take 81 mg by mouth once.       . BD ULTRA-FINE PEN NEEDLES 29G X  12.7MM MISC USE 1 PEN NEEDLE AS DIRECTED TO INJECT AS NEEDED  270 each  2  . cholecalciferol (VITAMIN D) 1000 UNITS tablet Take 2,500 Units by mouth daily. Vitamin D3      . clotrimazole-betamethasone (LOTRISONE) cream Apply topically 2 (two) times daily.  90 g  3  . HYDROcodone-acetaminophen (NORCO/VICODIN) 5-325 MG per tablet Take 1 tablet by mouth every 4 (four) hours as needed for pain. For pain  50 tablet  3  . insulin lispro (HUMALOG KWIKPEN) 100 UNIT/ML KiwkPen Inject 70 units 3 times per day  65 pen  3  . Insulin NPH, Human,, Isophane, (HUMULIN N KWIKPEN) 100 UNIT/ML Kiwkpen Inject 90 units into the skin at bedtime.  30 pen  2  . Insulin Pen Needle 29G X 12MM MISC Use 1 as directed to inject as needed.  100 each  2  . Multiple Vitamin (MULTIVITAMIN) tablet Take 1 tablet by mouth daily.        . simvastatin (ZOCOR) 40 MG tablet Take 1 tablet (40 mg total) by mouth daily.  90 tablet  1  . tamsulosin (FLOMAX) 0.4 MG CAPS capsule Take 1 capsule (0.4 mg total) by mouth  daily.  90 capsule  3  . simvastatin (ZOCOR) 40 MG tablet Take 1 tablet (40 mg total) by mouth daily.  90 tablet  3   No current facility-administered medications on file prior to visit.    No Known Allergies  Family History  Problem Relation Age of Onset  . Crohn's disease Father   . Cancer Neg Hx     BP 122/54  Pulse 82  Temp(Src) 97.5 F (36.4 C) (Oral)  Ht 6\' 4"  (1.93 m)  Wt 392 lb (177.81 kg)  BMI 47.74 kg/m2  SpO2 90%  Review of Systems He denies hypoglycemia.  He still has doe.      Objective:   Physical Exam VITAL SIGNS:  See vs page GENERAL: no distress.  Morbid obesity.        Assessment & Plan:  DM: uncertain control Noncompliance with cbg's: I'll work around this as best I can. Morbid obesity, persistent.  Patient is advised the following: Patient Instructions  check your blood sugar twice a day.  vary the time of day when you check, between before the 3 meals, and at bedtime.  also check  if you have symptoms of your blood sugar being too high or too low.  please keep a record of the readings and bring it to your next appointment here.  You can write it on any piece of paper.  please call us sooner if your blood sugar goes below 70, or if you have a lot of readings over 200.   Please let me know if you reconsider the weight-loss surgery.   Please come back for a regular physical appointment in 2 months (must be after 12/17/13).   Please call next week to let me know how the blood sugar is.

## 2013-11-03 ENCOUNTER — Telehealth: Payer: Self-pay | Admitting: Pulmonary Disease

## 2013-11-03 ENCOUNTER — Encounter: Payer: Self-pay | Admitting: Gastroenterology

## 2013-11-03 NOTE — Telephone Encounter (Signed)
ATC pt - line rang multiple times with NA and no option to leave msg.  WCB 

## 2013-11-04 ENCOUNTER — Telehealth: Payer: Self-pay

## 2013-11-04 NOTE — Telephone Encounter (Signed)
Spoke with pt-- states that he has already made an appt with another Dr to see him for sleep and pulmonary. Does not need an appt with our office any longer.  States he will call our office if anything further needed.

## 2013-11-04 NOTE — Telephone Encounter (Signed)
Called and spoke to pt. Pt requesting pulmonary and sleep consult with [facility]. Pt previously seen by [facility] for OSA in 2011. Pt was referred by Dr. Loanne Drilling for OSA consult. Pt called to schedule consult for OSA and c/o SOB-requesting pulmonary consult. Pt was advised that [place] does not take self-referral pulmonary and was advised pt could be seen by another physician for pulmonary. Pt refused alternate doctor for pulmonary and was very addiment that he sees [facility]. Advised pt that we could schedule appt for OSA and he could discuss SOB with [place] at this visit and [facility] could advise further. Pt refused and stated he wanted a pulmonary consult in addition to OSA.  [facility] please advise if you are willing to see pt for both OSA and SOB on same day.  Pt is requesting call back today to schedule.

## 2013-11-04 NOTE — Telephone Encounter (Signed)
No.  There is not enough time to see him for both on the same day.  It sounds like he is more concerned about sob.  I would like for him to call his primary and have them send a referral to [place] for sob.  The other option is to call [facility]'s nurse, and see if he will send referral for evaluation of dyspnea.    Will be happy to see him for his pulmonary symptoms, and can address sleep apnea at next visit .  Will obviously need 14min slot.

## 2013-11-04 NOTE — Telephone Encounter (Addendum)
[name] from Dr. Gwenette Greet office called. Pt has been referred to Dr. Gwenette Greet from our office to reestablish care and sleep apnea. Pt has called pulmonologist's office and complained of shortness of breath and would like to be seen for this as well.  Referral for Pulmonary dyspnea is needed for pt to be seen. Telephone note from 10/5 in epic states that pt no longer needs appointment. Requested call from [facility] to verify pt no longer needs referral.  Waiting on call back.

## 2013-11-06 NOTE — Telephone Encounter (Signed)
Pt no longer needs referral  

## 2013-11-07 ENCOUNTER — Telehealth: Payer: Self-pay | Admitting: Endocrinology

## 2013-11-07 NOTE — Telephone Encounter (Signed)
Pt advised and voiced understanding.   

## 2013-11-07 NOTE — Telephone Encounter (Signed)
please call patient: i got your fax Please increase NPH to 110 units at bedtime. i'll see you next time.

## 2013-11-18 ENCOUNTER — Telehealth: Payer: Self-pay | Admitting: Endocrinology

## 2013-11-18 MED ORDER — INSULIN ISOPHANE HUMAN 100 UNIT/ML KWIKPEN: PEN_INJECTOR | SUBCUTANEOUS | Status: DC

## 2013-11-18 NOTE — Telephone Encounter (Signed)
Rx refilled with the updated dosage.

## 2013-11-18 NOTE — Telephone Encounter (Signed)
Pt needs humulin n called in 3 boxes of 5 pens for one month takes 110 u daily plus priming

## 2013-11-27 DIAGNOSIS — L570 Actinic keratosis: Secondary | ICD-10-CM | POA: Diagnosis not present

## 2013-11-27 DIAGNOSIS — B354 Tinea corporis: Secondary | ICD-10-CM | POA: Diagnosis not present

## 2013-11-27 DIAGNOSIS — L820 Inflamed seborrheic keratosis: Secondary | ICD-10-CM | POA: Diagnosis not present

## 2013-11-28 ENCOUNTER — Other Ambulatory Visit: Payer: Self-pay | Admitting: Endocrinology

## 2013-12-02 DIAGNOSIS — E1140 Type 2 diabetes mellitus with diabetic neuropathy, unspecified: Secondary | ICD-10-CM | POA: Diagnosis not present

## 2013-12-02 DIAGNOSIS — B351 Tinea unguium: Secondary | ICD-10-CM | POA: Diagnosis not present

## 2013-12-02 DIAGNOSIS — L600 Ingrowing nail: Secondary | ICD-10-CM | POA: Diagnosis not present

## 2013-12-02 DIAGNOSIS — M79609 Pain in unspecified limb: Secondary | ICD-10-CM | POA: Diagnosis not present

## 2013-12-09 DIAGNOSIS — R0683 Snoring: Secondary | ICD-10-CM | POA: Diagnosis not present

## 2013-12-09 DIAGNOSIS — R5383 Other fatigue: Secondary | ICD-10-CM | POA: Diagnosis not present

## 2013-12-09 DIAGNOSIS — R0602 Shortness of breath: Secondary | ICD-10-CM | POA: Diagnosis not present

## 2013-12-09 DIAGNOSIS — G4733 Obstructive sleep apnea (adult) (pediatric): Secondary | ICD-10-CM | POA: Diagnosis not present

## 2013-12-16 DIAGNOSIS — R0602 Shortness of breath: Secondary | ICD-10-CM | POA: Diagnosis not present

## 2013-12-16 DIAGNOSIS — I517 Cardiomegaly: Secondary | ICD-10-CM | POA: Diagnosis not present

## 2013-12-19 ENCOUNTER — Institutional Professional Consult (permissible substitution): Payer: Medicare Other | Admitting: Pulmonary Disease

## 2013-12-23 ENCOUNTER — Telehealth: Payer: Self-pay | Admitting: *Deleted

## 2013-12-23 DIAGNOSIS — R5383 Other fatigue: Secondary | ICD-10-CM | POA: Diagnosis not present

## 2013-12-23 DIAGNOSIS — R0683 Snoring: Secondary | ICD-10-CM | POA: Diagnosis not present

## 2013-12-23 DIAGNOSIS — R0600 Dyspnea, unspecified: Secondary | ICD-10-CM | POA: Diagnosis not present

## 2013-12-23 DIAGNOSIS — G4733 Obstructive sleep apnea (adult) (pediatric): Secondary | ICD-10-CM | POA: Diagnosis not present

## 2013-12-23 NOTE — Telephone Encounter (Signed)
Dr Fuller Plan,  I was reviewing this pt's chart and noticed his weight is 392.0 lbs. I called the Patient and he states he is usually between 390-395 lbs always. I explained to him policy of weight >469 that he would have to be moved to hospital. He also has a very complicated medical history. He is a recall colon for hx colon polyps and his last colon was also done at [facility]. Is he ok for a direct colon at [facility] or does he need an OV? Please advise. His PV is 12-1 Tuesday.  Thanks for your time,   Marijean Niemann

## 2013-12-23 NOTE — Telephone Encounter (Signed)
Please schedule office appt with me or APP to further consider risks/benefits of colonoscopy

## 2013-12-23 NOTE — Telephone Encounter (Signed)
Spoke with pt's wife. He is at doctor appt. Wife scheduled OV for pt with Dr Fuller Plan 12-28 at [place] but wife states pt would only want to see Dr Fuller Plan. Explained about weight and complicated medical history and pt needs evaluation per DR Fuller Plan. She verbalized understanding. She also states he is having lots of lung issues and hopefully this will give time to get an answer for this as well. Cancelled PV and 12-15 colon and wife aware.  [name] PV

## 2013-12-30 ENCOUNTER — Encounter: Payer: Self-pay | Admitting: Endocrinology

## 2013-12-30 ENCOUNTER — Ambulatory Visit (INDEPENDENT_AMBULATORY_CARE_PROVIDER_SITE_OTHER): Payer: Medicare Other | Admitting: Endocrinology

## 2013-12-30 VITALS — BP 96/61 | HR 79 | Temp 97.9°F | Ht 75.0 in | Wt 397.0 lb

## 2013-12-30 DIAGNOSIS — I10 Essential (primary) hypertension: Secondary | ICD-10-CM | POA: Diagnosis not present

## 2013-12-30 DIAGNOSIS — I7090 Unspecified atherosclerosis: Secondary | ICD-10-CM | POA: Diagnosis not present

## 2013-12-30 DIAGNOSIS — K7581 Nonalcoholic steatohepatitis (NASH): Secondary | ICD-10-CM | POA: Diagnosis not present

## 2013-12-30 DIAGNOSIS — D649 Anemia, unspecified: Secondary | ICD-10-CM | POA: Diagnosis not present

## 2013-12-30 DIAGNOSIS — M10079 Idiopathic gout, unspecified ankle and foot: Secondary | ICD-10-CM

## 2013-12-30 DIAGNOSIS — E785 Hyperlipidemia, unspecified: Secondary | ICD-10-CM

## 2013-12-30 DIAGNOSIS — E876 Hypokalemia: Secondary | ICD-10-CM

## 2013-12-30 DIAGNOSIS — M109 Gout, unspecified: Secondary | ICD-10-CM

## 2013-12-30 DIAGNOSIS — Z125 Encounter for screening for malignant neoplasm of prostate: Secondary | ICD-10-CM

## 2013-12-30 DIAGNOSIS — E1042 Type 1 diabetes mellitus with diabetic polyneuropathy: Secondary | ICD-10-CM | POA: Diagnosis not present

## 2013-12-30 LAB — BASIC METABOLIC PANEL
BUN: 19 mg/dL (ref 6–23)
CALCIUM: 9 mg/dL (ref 8.4–10.5)
CO2: 24 meq/L (ref 19–32)
CREATININE: 1 mg/dL (ref 0.4–1.5)
Chloride: 108 mEq/L (ref 96–112)
GFR: 81.79 mL/min (ref >60.00)
Glucose, Bld: 184 mg/dL — ABNORMAL HIGH (ref 70–99)
Potassium: 4.4 mEq/L (ref 3.5–5.1)
Sodium: 140 mEq/L (ref 135–145)

## 2013-12-30 LAB — TSH: TSH: 2.92 u[IU]/mL (ref 0.35–4.50)

## 2013-12-30 LAB — MICROALBUMIN / CREATININE URINE RATIO
CREATININE, U: 137.9 mg/dL
Microalb Creat Ratio: 14.1 mg/g (ref 0.0–30.0)
Microalb, Ur: 19.4 mg/dL — ABNORMAL HIGH (ref 0.0–1.9)

## 2013-12-30 LAB — URIC ACID: URIC ACID, SERUM: 7.4 mg/dL (ref 4.0–7.8)

## 2013-12-30 LAB — HEMOGLOBIN A1C: HEMOGLOBIN A1C: 8.5 % — AB (ref 4.6–6.5)

## 2013-12-30 LAB — URINALYSIS, ROUTINE W REFLEX MICROSCOPIC
Bilirubin Urine: NEGATIVE
HGB URINE DIPSTICK: NEGATIVE
Ketones, ur: NEGATIVE
Leukocytes, UA: NEGATIVE
Nitrite: NEGATIVE
RBC / HPF: NONE SEEN (ref 0–?)
Specific Gravity, Urine: 1.025 (ref 1.000–1.030)
Total Protein, Urine: 30 — AB
UROBILINOGEN UA: 0.2 (ref 0.0–1.0)
Urine Glucose: NEGATIVE
WBC UA: NONE SEEN (ref 0–?)
pH: 5.5 (ref 5.0–8.0)

## 2013-12-30 LAB — HEPATIC FUNCTION PANEL
ALK PHOS: 56 U/L (ref 39–117)
ALT: 35 U/L (ref 0–53)
AST: 32 U/L (ref 0–37)
Albumin: 3.5 g/dL (ref 3.5–5.2)
BILIRUBIN TOTAL: 0.5 mg/dL (ref 0.2–1.2)
Bilirubin, Direct: 0.1 mg/dL (ref 0.0–0.3)
Total Protein: 6.3 g/dL (ref 6.0–8.3)

## 2013-12-30 LAB — CBC WITH DIFFERENTIAL/PLATELET
BASOS ABS: 0.1 10*3/uL (ref 0.0–0.1)
Basophils Relative: 0.8 % (ref 0.0–3.0)
EOS ABS: 0.4 10*3/uL (ref 0.0–0.7)
Eosinophils Relative: 5.1 % — ABNORMAL HIGH (ref 0.0–5.0)
HCT: 43.1 % (ref 39.0–52.0)
Hemoglobin: 14 g/dL (ref 13.0–17.0)
Lymphocytes Relative: 22.8 % (ref 12.0–46.0)
Lymphs Abs: 1.8 10*3/uL (ref 0.7–4.0)
MCHC: 32.4 g/dL (ref 30.0–36.0)
MCV: 92.6 fl (ref 78.0–100.0)
Monocytes Absolute: 0.9 10*3/uL (ref 0.1–1.0)
Monocytes Relative: 11.3 % (ref 3.0–12.0)
NEUTROS PCT: 60 % (ref 43.0–77.0)
Neutro Abs: 4.9 10*3/uL (ref 1.4–7.7)
PLATELETS: 272 10*3/uL (ref 150.0–400.0)
RBC: 4.66 Mil/uL (ref 4.22–5.81)
RDW: 14.1 % (ref 11.5–15.5)
WBC: 8.1 10*3/uL (ref 4.0–10.5)

## 2013-12-30 LAB — LIPID PANEL
Cholesterol: 142 mg/dL (ref 0–200)
HDL: 32.3 mg/dL — ABNORMAL LOW (ref >39.00)
NONHDL: 109.7
Total CHOL/HDL Ratio: 4
Triglycerides: 257 mg/dL — ABNORMAL HIGH (ref 0.0–149.0)
VLDL: 51.4 mg/dL — ABNORMAL HIGH (ref 0.0–40.0)

## 2013-12-30 LAB — PSA, MEDICARE: PSA: 0.5 ng/ml (ref 0.10–4.00)

## 2013-12-30 NOTE — Progress Notes (Signed)
Subjective:    Patient ID: [name], male    DOB: 12/04/1940, 73 y.o.   MRN: [id number]  HPI Pt returns for f/u of diabetes mellitus:  DM type: insulin-requiring type 2. Dx'ed: 2751 Complications: CAD and peripheral sensory neuropathy. Therapy: insulin since approx 2004.   DKA: never.   Severe hypoglycemia: never.   Pancreatitis: never.   Other: he takes multiple daily injections. He declines weight-loss surgery.   Interval history: he just increased the humalog a day or 2 ago.  he brings a record of his cbg's which i have reviewed today.  It varies from 68-200.  It is lowest at lunch and afternoon, and higher at hs and in am.   Past Medical History  Diagnosis Date  . Arthritis   . Hyperlipidemia   . Multinodular goiter   . Sleep apnea   . NASH (nonalcoholic steatohepatitis)   . DM retinopathy   . ED (erectile dysfunction)   . Colitis 1997    nonspecific  . Anemia, unspecified   . Diabetes mellitus     Type I  . DM peripheral angiopathy     Past Surgical History  Procedure Laterality Date  . Appendectomy    . Coronary artery bypass graft  2007    x 1  . Thoracotomy  2005    S/P Right Throactomy for Empyema  . Esophagogastroduodenoscopy  02/04/2003    normal    History   Social History  . Marital Status: Married    Spouse Name: N/A    Number of Children: N/A  . Years of Education: N/A   Occupational History  . Retired     laid off in 2010   Social History Main Topics  . Smoking status: Former Research scientist (life sciences)  . Smokeless tobacco: Not on file  . Alcohol Use: Yes     Comment: rare  . Drug Use: No  . Sexual Activity: Not on file   Other Topics Concern  . Not on file   Social History Narrative   Regular exercise-swims once day   Daily Caffeine Use: 1-2 cups per day    Current Outpatient Prescriptions on File Prior to Visit  Medication Sig Dispense Refill  . allopurinol (ZYLOPRIM) 300 MG tablet Take 1 tablet (300 mg total) by mouth daily. 30 tablet 0    . aspirin (ASPIRIN ADULT LOW STRENGTH) 81 MG EC tablet Take 81 mg by mouth once.     . B-D UF III MINI PEN NEEDLES 31G X 5 MM MISC INJECT 1  PEN NEEDLE INTO THE SKIN AS NEEDED AS DIRECTED 540 each 1  . BD ULTRA-FINE PEN NEEDLES 29G X 12.7MM MISC USE 1 PEN NEEDLE AS DIRECTED TO INJECT AS NEEDED 270 each 2  . cholecalciferol (VITAMIN D) 1000 UNITS tablet Take 2,500 Units by mouth daily. Vitamin D3    . clotrimazole-betamethasone (LOTRISONE) cream Apply topically 2 (two) times daily. 90 g 3  . furosemide (LASIX) 40 MG tablet Take 1 tablet (40 mg total) by mouth daily. 90 tablet 3  . HYDROcodone-acetaminophen (NORCO/VICODIN) 5-325 MG per tablet Take 1 tablet by mouth every 4 (four) hours as needed for pain. For pain 50 tablet 3  . insulin lispro (HUMALOG KWIKPEN) 100 UNIT/ML KiwkPen Inject 70 units 3 times per day (Patient taking differently: 3 times a day (just before each meal) 60-60-110 units) 65 pen 3  . Insulin NPH, Human,, Isophane, (HUMULIN N KWIKPEN) 100 UNIT/ML Kiwkpen Inject 110 units into the skin at bedtime. (  Patient taking differently: Inject 120 Units into the skin at bedtime. ) 35 pen 2  . Insulin Pen Needle 29G X 12MM MISC Use 1 as directed to inject as needed. 100 each 2  . Multiple Vitamin (MULTIVITAMIN) tablet Take 1 tablet by mouth daily.      . simvastatin (ZOCOR) 40 MG tablet Take 1 tablet (40 mg total) by mouth daily. 90 tablet 1  . tamsulosin (FLOMAX) 0.4 MG CAPS capsule Take 1 capsule (0.4 mg total) by mouth daily. 90 capsule 3  . simvastatin (ZOCOR) 40 MG tablet Take 1 tablet (40 mg total) by mouth daily. 90 tablet 3   No current facility-administered medications on file prior to visit.    No Known Allergies  Family History  Problem Relation Age of Onset  . Crohn's disease Father   . Cancer Neg Hx     BP 96/61 mmHg  Pulse 79  Temp(Src) 97.9 F (36.6 C) (Oral)  Ht 6\' 3"  (1.905 m)  Wt 397 lb (180.078 kg)  BMI 49.62 kg/m2  SpO2 95%  Review of Systems VITAL  SIGNS:  See vs page.  GENERAL: no distress. Pulses: dorsalis pedis absent bilat.  Feet: no deformity. 2+ bilat leg edema. There is bilateral onychomycosis; there is hyperpigmentation and erythema of the anterior tibial areas.  Skin: no ulcer on the feet. feet are of normal color and temp.  Neuro: sensation is intact to touch on the feet, but decreased from normal.     Objective:   Physical Exam VITAL SIGNS:  See vs page GENERAL: no distress.  Morbid obesity.   Pulses: dorsalis pedis intact bilat.  Feet: no deformity. 1+ bilat leg edema. There is bilateral onychomycosis; there is hyperpigmentation and erythema of the anterior tibial areas.  Skin: no ulcer on the feet. feet are of normal color and temp.  Neuro: sensation is intact to touch on the feet, but decreased from normal.  Lab Results  Component Value Date   WBC 8.1 12/30/2013   HGB 14.0 12/30/2013   HCT 43.1 12/30/2013   PLT 272.0 12/30/2013   GLUCOSE 184* 12/30/2013   CHOL 142 12/30/2013   TRIG 257.0* 12/30/2013   HDL 32.30* 12/30/2013   LDLDIRECT 68.1 12/30/2013   LDLCALC 54 10/13/2011   ALT 35 12/30/2013   AST 32 12/30/2013   NA 140 12/30/2013   K 4.4 12/30/2013   CL 108 12/30/2013   CREATININE 1.0 12/30/2013   BUN 19 12/30/2013   CO2 24 12/30/2013   TSH 2.92 12/30/2013   PSA 0.50 12/30/2013   INR 1.1 09/17/2006   HGBA1C 8.5* 12/30/2013   MICROALBUR 19.4* 12/30/2013       Assessment & Plan:  DM: severe exacerbation: Please change the humalog to 3 times a day (just before each meal) 60-60-110 units, and increase the NPH to 120 units qhs Dyslipidemia: well-controlled Hypotension, mild.  He is on diuretic, but no BP med.  We'll continue same lasix, due to h/o CHF.

## 2013-12-30 NOTE — Patient Instructions (Addendum)
check your blood sugar twice a day.  vary the time of day when you check, between before the 3 meals, and at bedtime.  also check if you have symptoms of your blood sugar being too high or too low.  please keep a record of the readings and bring it to your next appointment here.  You can write it on any piece of paper.  please call us sooner if your blood sugar goes below 70, or if you have a lot of readings over 200.    blood tests are being requested for you today.  We'll let you know about the results.   Please come back for a "medicare wellness" appointment in 3 months.    Let's check the circulation.  you will receive a phone call, about a day and time for an appointment.

## 2013-12-31 LAB — LDL CHOLESTEROL, DIRECT: Direct LDL: 68.1 mg/dL

## 2014-01-09 ENCOUNTER — Ambulatory Visit (HOSPITAL_COMMUNITY): Payer: Medicare Other | Attending: Endocrinology | Admitting: *Deleted

## 2014-01-09 DIAGNOSIS — E785 Hyperlipidemia, unspecified: Secondary | ICD-10-CM | POA: Insufficient documentation

## 2014-01-09 DIAGNOSIS — M79604 Pain in right leg: Secondary | ICD-10-CM

## 2014-01-09 DIAGNOSIS — R200 Anesthesia of skin: Secondary | ICD-10-CM | POA: Diagnosis not present

## 2014-01-09 DIAGNOSIS — E119 Type 2 diabetes mellitus without complications: Secondary | ICD-10-CM | POA: Insufficient documentation

## 2014-01-09 DIAGNOSIS — E876 Hypokalemia: Secondary | ICD-10-CM

## 2014-01-09 DIAGNOSIS — I7090 Unspecified atherosclerosis: Secondary | ICD-10-CM

## 2014-01-09 DIAGNOSIS — J449 Chronic obstructive pulmonary disease, unspecified: Secondary | ICD-10-CM | POA: Insufficient documentation

## 2014-01-09 DIAGNOSIS — M79662 Pain in left lower leg: Secondary | ICD-10-CM

## 2014-01-09 DIAGNOSIS — D649 Anemia, unspecified: Secondary | ICD-10-CM

## 2014-01-09 DIAGNOSIS — I10 Essential (primary) hypertension: Secondary | ICD-10-CM | POA: Diagnosis not present

## 2014-01-09 DIAGNOSIS — M109 Gout, unspecified: Secondary | ICD-10-CM

## 2014-01-09 DIAGNOSIS — Z125 Encounter for screening for malignant neoplasm of prostate: Secondary | ICD-10-CM

## 2014-01-09 DIAGNOSIS — K7581 Nonalcoholic steatohepatitis (NASH): Secondary | ICD-10-CM

## 2014-01-09 DIAGNOSIS — E1042 Type 1 diabetes mellitus with diabetic polyneuropathy: Secondary | ICD-10-CM

## 2014-01-09 NOTE — Progress Notes (Signed)
LEA Doppler performed 

## 2014-01-13 ENCOUNTER — Encounter: Payer: Medicare Other | Admitting: Gastroenterology

## 2014-01-19 ENCOUNTER — Other Ambulatory Visit: Payer: Self-pay | Admitting: Endocrinology

## 2014-01-21 ENCOUNTER — Encounter: Payer: Self-pay | Admitting: *Deleted

## 2014-01-26 ENCOUNTER — Encounter: Payer: Self-pay | Admitting: Gastroenterology

## 2014-01-26 ENCOUNTER — Ambulatory Visit (INDEPENDENT_AMBULATORY_CARE_PROVIDER_SITE_OTHER): Payer: Medicare Other | Admitting: Gastroenterology

## 2014-01-26 VITALS — BP 152/72 | HR 68 | Ht 74.75 in | Wt 389.2 lb

## 2014-01-26 DIAGNOSIS — Z86010 Personal history of colonic polyps: Secondary | ICD-10-CM | POA: Diagnosis not present

## 2014-01-26 DIAGNOSIS — Z860101 Personal history of adenomatous and serrated colon polyps: Secondary | ICD-10-CM

## 2014-01-26 DIAGNOSIS — K921 Melena: Secondary | ICD-10-CM | POA: Diagnosis not present

## 2014-01-26 MED ORDER — PEG-KCL-NACL-NASULF-NA ASC-C 100 G PO SOLR: 1.0000 | Freq: Once | ORAL | Status: DC

## 2014-01-26 NOTE — Progress Notes (Signed)
History of Present Illness: This is a 73 year old male who is returning for history of adenomatous colon polyps. He underwent colonoscopy in 2010 with one 5 mm adenoma. He has noted occasional small amounts of bright red blood per rectum with bowel movements that he has attributed to hemorrhoids. He has no other gastrointestinal complaints. Denies weight loss, abdominal pain, constipation, diarrhea, change in stool caliber, melena, hematochezia, nausea, vomiting, dysphagia, reflux symptoms, chest pain.  Allergies  Allergen Reactions  . Influenza Vaccines Rash    Possible allergy   Outpatient Prescriptions Prior to Visit  Medication Sig Dispense Refill  . allopurinol (ZYLOPRIM) 300 MG tablet Take 1 tablet (300 mg total) by mouth daily. 30 tablet 0  . aspirin (ASPIRIN ADULT LOW STRENGTH) 81 MG EC tablet Take 81 mg by mouth once.     . B-D UF III MINI PEN NEEDLES 31G X 5 MM MISC INJECT 1  PEN NEEDLE INTO THE SKIN AS NEEDED AS DIRECTED 540 each 1  . BD ULTRA-FINE PEN NEEDLES 29G X 12.7MM MISC USE 1 PEN NEEDLE AS DIRECTED TO INJECT AS NEEDED 270 each 2  . clotrimazole-betamethasone (LOTRISONE) cream Apply topically 2 (two) times daily. 90 g 3  . furosemide (LASIX) 40 MG tablet Take 1 tablet (40 mg total) by mouth daily. 90 tablet 3  . HUMULIN N KWIKPEN 100 UNIT/ML Kiwkpen INJECT 110 UNITS INTO THE SKIN AT BEDTIME (Patient taking differently: INJECT 120 UNITS INTO THE SKIN AT BEDTIME) 105 mL 2  . HYDROcodone-acetaminophen (NORCO/VICODIN) 5-325 MG per tablet Take 1 tablet by mouth every 4 (four) hours as needed for pain. For pain 50 tablet 3  . insulin lispro (HUMALOG KWIKPEN) 100 UNIT/ML KiwkPen Inject 70 units 3 times per day (Patient taking differently: Inject 60 Units into the skin 3 (three) times daily. 3 times a day (just before each meal) 60-60-110 units) 65 pen 3  . Insulin Pen Needle 29G X 12MM MISC Use 1 as directed to inject as needed. 100 each 2  . Multiple Vitamin (MULTIVITAMIN) tablet  Take 1 tablet by mouth daily.      . tamsulosin (FLOMAX) 0.4 MG CAPS capsule Take 1 capsule (0.4 mg total) by mouth daily. 90 capsule 3  . cholecalciferol (VITAMIN D) 1000 UNITS tablet Take 2,500 Units by mouth daily. Vitamin D3    . simvastatin (ZOCOR) 40 MG tablet Take 1 tablet (40 mg total) by mouth daily. 90 tablet 1  . simvastatin (ZOCOR) 40 MG tablet Take 1 tablet (40 mg total) by mouth daily. 90 tablet 3   No facility-administered medications prior to visit.   Past Medical History  Diagnosis Date  . Arthritis   . Hyperlipidemia   . Multinodular goiter   . Sleep apnea   . NASH (nonalcoholic steatohepatitis)   . DM retinopathy   . ED (erectile dysfunction)   . Colitis 1997    nonspecific  . Anemia, unspecified   . Diabetes mellitus     Type I  . DM peripheral angiopathy   . Tubular adenoma of colon   . Osteoarthritis    Past Surgical History  Procedure Laterality Date  . Appendectomy    . Coronary artery bypass graft  2007    x 1  . Thoracotomy  2005    S/P Right Throactomy for Empyema  . Esophagogastroduodenoscopy  02/04/2003    normal   History   Social History  . Marital Status: Married    Spouse Name: N/A  Number of Children: N/A  . Years of Education: N/A   Occupational History  . Retired     laid off in 2010   Social History Main Topics  . Smoking status: Former Research scientist (life sciences)  . Smokeless tobacco: None  . Alcohol Use: Yes     Comment: rare  . Drug Use: No  . Sexual Activity: None   Other Topics Concern  . None   Social History Narrative   Regular exercise-swims once day   Daily Caffeine Use: 1-2 cups per day   Family History  Problem Relation Age of Onset  . Crohn's disease Father   . Colon cancer Neg Hx      Review of Systems: Pertinent positive and negative review of systems were noted in the above HPI section. All other review of systems were otherwise negative.   Physical Exam: General: Well developed , well nourished, obese, no acute  distress Head: Normocephalic and atraumatic Eyes:  sclerae anicteric, EOMI Ears: Normal auditory acuity Mouth: No deformity or lesions Neck: Supple, no masses or thyromegaly Lungs: Clear throughout to auscultation Heart: Regular rate and rhythm; no murmurs, rubs or bruits Abdomen: Soft, non tender and non distended. No masses, hepatosplenomegaly or hernias noted. Normal Bowel sounds Rectal: deferred to colonoscopy Musculoskeletal: Symmetrical with no gross deformities  Skin: No lesions on visible extremities Pulses:  Normal pulses noted Extremities: No clubbing, cyanosis, edema or deformities noted Neurological: Alert oriented x 4, grossly nonfocal Cervical Nodes:  No significant cervical adenopathy Inguinal Nodes: No significant inguinal adenopathy Psychological:  Alert and cooperative. Normal mood and affect  Assessment and Recommendations:  1. Personal history of adenomatous colon polyps. The risks, benefits, and alternatives to colonoscopy with possible biopsy and possible polypectomy were discussed with the patient and they consent to proceed.   2. NASH. Low fat diet and weight loss.   3. Obesity  4. DM  5. OSA on CPAP followed by Dr. Dionne Milo

## 2014-01-26 NOTE — Patient Instructions (Addendum)
You have been scheduled for a colonoscopy. Please follow written instructions given to you at your visit today.  Please pick up your prep kit at the pharmacy within the next 1-3 days. If you use inhalers (even only as needed), please bring them with you on the day of your procedure.  Thank you for choosing me and [facility].  Malcolm T. Stark, Jr., MD., FACG   

## 2014-02-11 ENCOUNTER — Encounter: Payer: Self-pay | Admitting: Endocrinology

## 2014-02-11 DIAGNOSIS — H04123 Dry eye syndrome of bilateral lacrimal glands: Secondary | ICD-10-CM | POA: Diagnosis not present

## 2014-02-11 DIAGNOSIS — H02831 Dermatochalasis of right upper eyelid: Secondary | ICD-10-CM | POA: Diagnosis not present

## 2014-02-11 DIAGNOSIS — H2513 Age-related nuclear cataract, bilateral: Secondary | ICD-10-CM | POA: Diagnosis not present

## 2014-02-11 DIAGNOSIS — H02834 Dermatochalasis of left upper eyelid: Secondary | ICD-10-CM | POA: Diagnosis not present

## 2014-02-11 LAB — HM DIABETES EYE EXAM

## 2014-03-05 ENCOUNTER — Telehealth: Payer: Self-pay | Admitting: Gastroenterology

## 2014-03-05 NOTE — Telephone Encounter (Signed)
OTC PPI bid for 7 days then qam See APP this week or next regarding if this pain might be GERD and if EGD is needed  Contact his PCP to further evaluate chest pain for non GI related causes. He should call them today.

## 2014-03-05 NOTE — Telephone Encounter (Signed)
Patient reports a one week history of chest pressure.  He tried several OTC Peptobismol and Tums, he had some minimal relief with Tums. Pain is constant and is not worsened with activity or movement.    He is asking if he needs to have an EGD added to his hospital case colonoscopy?  He is advised to start OTC PPI he is given the name of several.  He denies indigestion, heartburn.  Please advise

## 2014-03-05 NOTE — Telephone Encounter (Signed)
Patient notified he will come on Wed 03/11/14 1:30

## 2014-03-06 ENCOUNTER — Ambulatory Visit (INDEPENDENT_AMBULATORY_CARE_PROVIDER_SITE_OTHER): Payer: Medicare Other | Admitting: Endocrinology

## 2014-03-06 ENCOUNTER — Encounter: Payer: Self-pay | Admitting: Endocrinology

## 2014-03-06 ENCOUNTER — Other Ambulatory Visit: Payer: Self-pay

## 2014-03-06 VITALS — BP 134/84 | HR 100 | Temp 98.1°F | Ht 74.75 in | Wt 393.0 lb

## 2014-03-06 DIAGNOSIS — E1042 Type 1 diabetes mellitus with diabetic polyneuropathy: Secondary | ICD-10-CM | POA: Diagnosis not present

## 2014-03-06 DIAGNOSIS — R1013 Epigastric pain: Secondary | ICD-10-CM

## 2014-03-06 LAB — CBC WITH DIFFERENTIAL/PLATELET
BASOS PCT: 0.9 % (ref 0.0–3.0)
Basophils Absolute: 0.1 10*3/uL (ref 0.0–0.1)
Eosinophils Absolute: 0.4 10*3/uL (ref 0.0–0.7)
Eosinophils Relative: 4.5 % (ref 0.0–5.0)
HCT: 43 % (ref 39.0–52.0)
HEMOGLOBIN: 14.5 g/dL (ref 13.0–17.0)
LYMPHS ABS: 1.9 10*3/uL (ref 0.7–4.0)
Lymphocytes Relative: 19.4 % (ref 12.0–46.0)
MCHC: 33.7 g/dL (ref 30.0–36.0)
MCV: 89.8 fl (ref 78.0–100.0)
Monocytes Absolute: 1.2 10*3/uL — ABNORMAL HIGH (ref 0.1–1.0)
Monocytes Relative: 12.2 % — ABNORMAL HIGH (ref 3.0–12.0)
NEUTROS PCT: 63 % (ref 43.0–77.0)
Neutro Abs: 6.1 10*3/uL (ref 1.4–7.7)
Platelets: 267 10*3/uL (ref 150.0–400.0)
RBC: 4.79 Mil/uL (ref 4.22–5.81)
RDW: 13.2 % (ref 11.5–15.5)
WBC: 9.7 10*3/uL (ref 4.0–10.5)

## 2014-03-06 LAB — HEPATIC FUNCTION PANEL
ALT: 26 U/L (ref 0–53)
AST: 22 U/L (ref 0–37)
Albumin: 3.7 g/dL (ref 3.5–5.2)
Alkaline Phosphatase: 64 U/L (ref 39–117)
BILIRUBIN TOTAL: 0.3 mg/dL (ref 0.2–1.2)
Bilirubin, Direct: 0.1 mg/dL (ref 0.0–0.3)
TOTAL PROTEIN: 7 g/dL (ref 6.0–8.3)

## 2014-03-06 LAB — BASIC METABOLIC PANEL
BUN: 19 mg/dL (ref 6–23)
CO2: 30 mEq/L (ref 19–32)
CREATININE: 1.16 mg/dL (ref 0.40–1.50)
Calcium: 9.6 mg/dL (ref 8.4–10.5)
Chloride: 104 mEq/L (ref 96–112)
GFR: 65.71 mL/min (ref >60.00)
Glucose, Bld: 168 mg/dL — ABNORMAL HIGH (ref 70–99)
Potassium: 4.5 mEq/L (ref 3.5–5.1)
Sodium: 139 mEq/L (ref 135–145)

## 2014-03-06 LAB — AMYLASE: Amylase: 69 U/L (ref 27–131)

## 2014-03-06 LAB — HEMOGLOBIN A1C: Hgb A1c MFr Bld: 8.1 % — ABNORMAL HIGH (ref 4.6–6.5)

## 2014-03-06 MED ORDER — SIMVASTATIN 40 MG PO TABS: 40.0000 mg | ORAL_TABLET | Freq: Every day | ORAL | Status: DC

## 2014-03-06 MED ORDER — TAMSULOSIN HCL 0.4 MG PO CAPS: 0.4000 mg | ORAL_CAPSULE | Freq: Every day | ORAL | Status: DC

## 2014-03-06 MED ORDER — INSULIN ISOPHANE HUMAN 100 UNIT/ML KWIKPEN: PEN_INJECTOR | SUBCUTANEOUS | Status: DC

## 2014-03-06 MED ORDER — INSULIN LISPRO 100 UNIT/ML (KWIKPEN): PEN_INJECTOR | SUBCUTANEOUS | Status: DC

## 2014-03-06 MED ORDER — OMEPRAZOLE 40 MG PO CPDR: 40.0000 mg | DELAYED_RELEASE_CAPSULE | Freq: Every day | ORAL | Status: DC

## 2014-03-06 MED ORDER — INSULIN PEN NEEDLE 32G X 4 MM MISC: Status: DC

## 2014-03-06 NOTE — Patient Instructions (Addendum)
check your blood sugar twice a day.  vary the time of day when you check, between before the 3 meals, and at bedtime.  also check if you have symptoms of your blood sugar being too high or too low.  please keep a record of the readings and bring it to your next appointment here.  You can write it on any piece of paper.  please call us sooner if your blood sugar goes below 70, or if you have a lot of readings over 200.    blood tests are being requested for you today.  We'll let you know about the results.   i have sent a prescription to your pharmacy, for the stomach.   Please come back for a "medicare wellness" appointment in 3 months.   i'll ask Dr Fuller Plan about adding the upper endoscopy to the colonoscopy.

## 2014-03-06 NOTE — Progress Notes (Signed)
Subjective:    Patient ID: [name], male    DOB: 1941/01/03, 74 y.o.   MRN: [id number]  HPI Pt returns for f/u of diabetes mellitus:  DM type: insulin-requiring type 2. Dx'ed: 3383 Complications: CAD and peripheral sensory neuropathy. Therapy: insulin since approx 2004.   DKA: never.   Severe hypoglycemia: never.   Pancreatitis: never.   Other: he takes multiple daily injections. He declines weight-loss surgery.   Interval history: He denies hypoglycemia.  no cbg record, but states cbg's are well-controlled.  He often takes less than the prescribed insulin, out of fear of hypoglycemia.   Pt states 10 days of moderate burning-quality pain at the epigastric area, but no assoc BRBPR.  Pain is nonexertional, and is worse with lying supine.   Past Medical History  Diagnosis Date  . Arthritis   . Hyperlipidemia   . Multinodular goiter   . Sleep apnea   . NASH (nonalcoholic steatohepatitis)   . DM retinopathy   . ED (erectile dysfunction)   . Colitis 1997    nonspecific  . Anemia, unspecified   . Diabetes mellitus     Type I  . DM peripheral angiopathy   . Tubular adenoma of colon   . Osteoarthritis     Past Surgical History  Procedure Laterality Date  . Appendectomy    . Coronary artery bypass graft  2007    x 1  . Thoracotomy  2005    S/P Right Throactomy for Empyema  . Esophagogastroduodenoscopy  02/04/2003    normal    History   Social History  . Marital Status: Married    Spouse Name: N/A    Number of Children: N/A  . Years of Education: N/A   Occupational History  . Retired     laid off in 2010   Social History Main Topics  . Smoking status: Former Research scientist (life sciences)  . Smokeless tobacco: Not on file  . Alcohol Use: Yes     Comment: rare  . Drug Use: No  . Sexual Activity: Not on file   Other Topics Concern  . Not on file   Social History Narrative   Regular exercise-swims once day   Daily Caffeine Use: 1-2 cups per day    Current Outpatient  Prescriptions on File Prior to Visit  Medication Sig Dispense Refill  . allopurinol (ZYLOPRIM) 300 MG tablet Take 1 tablet (300 mg total) by mouth daily. 30 tablet 0  . aspirin (ASPIRIN ADULT LOW STRENGTH) 81 MG EC tablet Take 81 mg by mouth once.     . B-D UF III MINI PEN NEEDLES 31G X 5 MM MISC INJECT 1  PEN NEEDLE INTO THE SKIN AS NEEDED AS DIRECTED 540 each 1  . BD ULTRA-FINE PEN NEEDLES 29G X 12.7MM MISC USE 1 PEN NEEDLE AS DIRECTED TO INJECT AS NEEDED 270 each 2  . beta carotene w/minerals (OCUVITE) tablet Take 1 tablet by mouth daily.    . clotrimazole-betamethasone (LOTRISONE) cream Apply topically 2 (two) times daily. 90 g 3  . Coenzyme Q10 (COQ10 PO) Take 1 tablet by mouth daily.    Marland Kitchen desonide (DESOWEN) 0.05 % ointment Apply 1 application topically daily as needed (skin irrtation).     Marland Kitchen econazole nitrate 1 % cream Apply 1 application topically daily as needed (skin irritation).     . fluocinonide (LIDEX) 0.05 % external solution Apply 1 application topically daily as needed (Skin irritation).     . furosemide (LASIX) 40 MG tablet  Take 1 tablet (40 mg total) by mouth daily. 90 tablet 3  . HYDROcodone-acetaminophen (NORCO/VICODIN) 5-325 MG per tablet Take 1 tablet by mouth every 4 (four) hours as needed for pain. For pain 50 tablet 3  . Insulin Pen Needle 29G X 12MM MISC Use 1 as directed to inject as needed. 100 each 2  . Multiple Vitamin (MULTIVITAMIN) tablet Take 1 tablet by mouth daily.      . peg 3350 powder (MOVIPREP) 100 G SOLR Take 1 kit (200 g total) by mouth once. 1 kit 0   No current facility-administered medications on file prior to visit.    No Known Allergies  Family History  Problem Relation Age of Onset  . Crohn's disease Father   . Colon cancer Neg Hx     BP 134/84 mmHg  Pulse 100  Temp(Src) 98.1 F (36.7 C) (Oral)  Ht 6' 2.75" (1.899 m)  Wt 393 lb (178.264 kg)  BMI 49.43 kg/m2  SpO2 94%  Review of Systems Denies n/v, weight loss, and dysphagia.       Objective:   Physical Exam VITAL SIGNS:  See vs page GENERAL: no distress ABDOMEN: abdomen is soft, nontender.  no hepatosplenomegaly. not distended.  no hernia Pulses: dorsalis pedis intact bilat.   MSK: no deformity of the feet.  CV: no leg edema Skin:  no ulcer on the feet.  normal color and temp on the feet. Neuro: sensation is intact to touch on the feet   i personally reviewed electrocardiogram tracing: Left anterior fascicular block. Diffuse nonspecific T wave abnormality.   Lab Results  Component Value Date   HGBA1C 8.1* 03/06/2014   Lab Results  Component Value Date   WBC 9.7 03/06/2014   HGB 14.5 03/06/2014   HCT 43.0 03/06/2014   MCV 89.8 03/06/2014   PLT 267.0 03/06/2014   Lab Results  Component Value Date   ALT 26 03/06/2014   AST 22 03/06/2014   ALKPHOS 64 03/06/2014   BILITOT 0.3 03/06/2014       Assessment & Plan:  epigast pain, new, uncertain etiology. DM: moderate exacerbation   Patient is advised the following: Patient Instructions  check your blood sugar twice a day.  vary the time of day when you check, between before the 3 meals, and at bedtime.  also check if you have symptoms of your blood sugar being too high or too low.  please keep a record of the readings and bring it to your next appointment here.  You can write it on any piece of paper.  please call us sooner if your blood sugar goes below 70, or if you have a lot of readings over 200.    blood tests are being requested for you today.  We'll let you know about the results.   i have sent a prescription to your pharmacy, for the stomach.   Please come back for a "medicare wellness" appointment in 3 months.   i'll ask Dr Fuller Plan about adding the upper endoscopy to the colonoscopy.     try to increase the insulin closer to the prescribed amount.

## 2014-03-07 MED ORDER — OMEPRAZOLE 40 MG PO CPDR: 40.0000 mg | DELAYED_RELEASE_CAPSULE | Freq: Every day | ORAL | Status: DC

## 2014-03-09 NOTE — Telephone Encounter (Signed)
error 

## 2014-03-11 ENCOUNTER — Encounter: Payer: Self-pay | Admitting: Gastroenterology

## 2014-03-11 ENCOUNTER — Ambulatory Visit (INDEPENDENT_AMBULATORY_CARE_PROVIDER_SITE_OTHER): Payer: Medicare Other | Admitting: Gastroenterology

## 2014-03-11 VITALS — BP 140/60 | HR 76 | Ht 74.75 in | Wt 395.4 lb

## 2014-03-11 DIAGNOSIS — K625 Hemorrhage of anus and rectum: Secondary | ICD-10-CM | POA: Diagnosis not present

## 2014-03-11 DIAGNOSIS — R1013 Epigastric pain: Secondary | ICD-10-CM | POA: Diagnosis not present

## 2014-03-11 NOTE — Patient Instructions (Signed)
We have scheduled you for an Endoscopy to be done along with your colonoscopy        Please follow your instructions that was given to you for your procedures

## 2014-03-11 NOTE — Progress Notes (Signed)
     03/11/2014 Michael RIDLON [id number] 07-21-1940   History of Present Illness:  This is a 74 year old male who is known to Dr. Fuller Plan.  He was last seen in 12/2013 and was scheduled for a colonoscopy, which is set to take place on 2/22.  He presents to our office today to discuss some epigastric abdominal pain that he had been experiencing and to possibly add an EGD to his already scheduled colonoscopy per the request of his PCP, Dr. Loanne Drilling.  He says that a few weeks ago he started having tightness and burning as well as a dull ache across his upper abdomen.  He tried Tums with some relief.  He saw his PCP on 2/5 at which time labs were drawn; CBC, BMP, amylase, and LFT's were normal.  He was started on omeprazole 40 mg daily and he says that after taking the first dose he began to feel much better and has not had any further pain in the past 5 days.  He has also been avoiding spicy foods and trying to eat a very bland diet.    While he is here he also mentioned that he does intermittently see some bright red blood on the toilet paper and in the toilet bowl, which he has attributed to hemorrhoids.  This was discussed at his last visit in December when he was scheduled for colonoscopy as well.  He happened to see this for two days last week so wanted to mention it at this visit today as well.   Current Medications, Allergies, Past Medical History, Past Surgical History, Family History and Social History were reviewed in [employer] record.   Physical Exam: BP 140/60 mmHg  Pulse 76  Ht 6' 2.75" (1.899 m)  Wt 395 lb 6 oz (179.341 kg)  BMI 49.73 kg/m2 General: Well developed white male in no acute distress Head: Normocephalic and atraumatic Eyes:  Sclerae anicteric, conjunctiva pink  Ears: Normal auditory acuity Lungs: Clear throughout to auscultation Heart: Regular rate and rhythm Abdomen: Soft, obese, non-distended.  Normal bowel sounds.  Non-tender. Rectal:  Will  be done at the time of colonoscopy. Musculoskeletal: Symmetrical with no gross deformities  Extremities: No edema  Neurological: Alert oriented x 4, grossly non-focal Psychological:  Alert and cooperative. Normal mood and affect  Assessment and Recommendations: -Rectal bleeding  He is already scheduled for colonoscopy on 2/22 and this was mentioned/discussed at his previous visit.  Bleeding is intermittent and there has not been any bleeding in about a week.   -Epigastric abdominal pain:  Resolved almost completely on omeprazole 40 mg daily.  His PCP is requesting EGD be added to his colonoscopy procedure.  His symptoms were likely secondary to GERD, but will add EGD to his already scheduled colonoscopy.  The risks, benefits, and alternatives were discussed with the patient and he consents to proceed.  Continue omeprazole 40 mg daily for now.  cc: Dr. Loanne Drilling

## 2014-03-11 NOTE — Progress Notes (Signed)
Reviewed and agree with management plan.  Paxtyn Boyar T. Roxy Mastandrea, MD FACG 

## 2014-03-16 ENCOUNTER — Encounter (HOSPITAL_COMMUNITY): Payer: Self-pay | Admitting: *Deleted

## 2014-03-16 NOTE — Progress Notes (Signed)
Consulted Dr. Jillyn Hidden ,Anesthesiologist about EKG and per Dr. Jillyn Hidden, EKG is OK and patient Ok to have procedure.

## 2014-03-23 ENCOUNTER — Encounter (HOSPITAL_COMMUNITY): Payer: Self-pay | Admitting: Anesthesiology

## 2014-03-23 ENCOUNTER — Ambulatory Visit (HOSPITAL_COMMUNITY): Payer: Medicare Other | Admitting: Anesthesiology

## 2014-03-23 ENCOUNTER — Encounter (HOSPITAL_COMMUNITY)
Admission: RE | Disposition: A | Discharge status: Home / Self Care | Payer: Self-pay | Source: Ambulatory Visit | Attending: Gastroenterology

## 2014-03-23 ENCOUNTER — Ambulatory Visit (HOSPITAL_COMMUNITY)
Admission: RE | Admit: 2014-03-23 | Discharge: 2014-03-23 | Disposition: A | Discharge status: Home / Self Care | Payer: Medicare Other | Source: Ambulatory Visit | Attending: Gastroenterology | Admitting: Gastroenterology

## 2014-03-23 DIAGNOSIS — I509 Heart failure, unspecified: Secondary | ICD-10-CM | POA: Diagnosis not present

## 2014-03-23 DIAGNOSIS — K640 First degree hemorrhoids: Secondary | ICD-10-CM | POA: Insufficient documentation

## 2014-03-23 DIAGNOSIS — E780 Pure hypercholesterolemia: Secondary | ICD-10-CM | POA: Diagnosis not present

## 2014-03-23 DIAGNOSIS — I10 Essential (primary) hypertension: Secondary | ICD-10-CM | POA: Diagnosis not present

## 2014-03-23 DIAGNOSIS — D123 Benign neoplasm of transverse colon: Secondary | ICD-10-CM | POA: Diagnosis not present

## 2014-03-23 DIAGNOSIS — D122 Benign neoplasm of ascending colon: Secondary | ICD-10-CM | POA: Diagnosis not present

## 2014-03-23 DIAGNOSIS — K648 Other hemorrhoids: Secondary | ICD-10-CM | POA: Diagnosis not present

## 2014-03-23 DIAGNOSIS — Z6841 Body Mass Index (BMI) 40.0 and over, adult: Secondary | ICD-10-CM | POA: Diagnosis not present

## 2014-03-23 DIAGNOSIS — Z951 Presence of aortocoronary bypass graft: Secondary | ICD-10-CM | POA: Diagnosis not present

## 2014-03-23 DIAGNOSIS — K921 Melena: Secondary | ICD-10-CM | POA: Insufficient documentation

## 2014-03-23 DIAGNOSIS — Z794 Long term (current) use of insulin: Secondary | ICD-10-CM | POA: Insufficient documentation

## 2014-03-23 DIAGNOSIS — M109 Gout, unspecified: Secondary | ICD-10-CM | POA: Diagnosis not present

## 2014-03-23 DIAGNOSIS — E1051 Type 1 diabetes mellitus with diabetic peripheral angiopathy without gangrene: Secondary | ICD-10-CM | POA: Insufficient documentation

## 2014-03-23 DIAGNOSIS — K219 Gastro-esophageal reflux disease without esophagitis: Secondary | ICD-10-CM | POA: Insufficient documentation

## 2014-03-23 DIAGNOSIS — I2510 Atherosclerotic heart disease of native coronary artery without angina pectoris: Secondary | ICD-10-CM | POA: Diagnosis not present

## 2014-03-23 DIAGNOSIS — E10319 Type 1 diabetes mellitus with unspecified diabetic retinopathy without macular edema: Secondary | ICD-10-CM | POA: Diagnosis not present

## 2014-03-23 DIAGNOSIS — I739 Peripheral vascular disease, unspecified: Secondary | ICD-10-CM | POA: Insufficient documentation

## 2014-03-23 DIAGNOSIS — Z86010 Personal history of colonic polyps: Secondary | ICD-10-CM | POA: Diagnosis not present

## 2014-03-23 DIAGNOSIS — Z7982 Long term (current) use of aspirin: Secondary | ICD-10-CM | POA: Diagnosis not present

## 2014-03-23 DIAGNOSIS — G4733 Obstructive sleep apnea (adult) (pediatric): Secondary | ICD-10-CM | POA: Insufficient documentation

## 2014-03-23 DIAGNOSIS — Z87891 Personal history of nicotine dependence: Secondary | ICD-10-CM | POA: Diagnosis not present

## 2014-03-23 DIAGNOSIS — R1013 Epigastric pain: Secondary | ICD-10-CM | POA: Diagnosis present

## 2014-03-23 HISTORY — PX: COLONOSCOPY WITH PROPOFOL: SHX5780

## 2014-03-23 HISTORY — PX: ESOPHAGOGASTRODUODENOSCOPY (EGD) WITH PROPOFOL: SHX5813

## 2014-03-23 LAB — GLUCOSE, CAPILLARY: GLUCOSE-CAPILLARY: 131 mg/dL — AB (ref 70–99)

## 2014-03-23 SURGERY — COLONOSCOPY WITH PROPOFOL
Anesthesia: Monitor Anesthesia Care

## 2014-03-23 MED ORDER — PROPOFOL INFUSION 10 MG/ML OPTIME
INTRAVENOUS | Status: DC | PRN
  Administered 2014-03-23: 150 ug/kg/min via INTRAVENOUS

## 2014-03-23 MED ORDER — KETAMINE HCL 10 MG/ML IJ SOLN
INTRAMUSCULAR | Status: AC
  Filled 2014-03-23: qty 1

## 2014-03-23 MED ORDER — PROPOFOL 10 MG/ML IV BOLUS
INTRAVENOUS | Status: AC
  Filled 2014-03-23: qty 20

## 2014-03-23 MED ORDER — ONDANSETRON HCL 4 MG/2ML IJ SOLN
INTRAMUSCULAR | Status: DC | PRN
  Administered 2014-03-23: 4 mg via INTRAVENOUS

## 2014-03-23 MED ORDER — EPHEDRINE SULFATE 50 MG/ML IJ SOLN
INTRAMUSCULAR | Status: AC
  Filled 2014-03-23: qty 1

## 2014-03-23 MED ORDER — GLYCOPYRROLATE 0.2 MG/ML IJ SOLN
INTRAMUSCULAR | Status: DC | PRN
  Administered 2014-03-23: 0.2 mg via INTRAVENOUS

## 2014-03-23 MED ORDER — LACTATED RINGERS IV SOLN
INTRAVENOUS | Status: DC | PRN
  Administered 2014-03-23: 09:00:00 via INTRAVENOUS

## 2014-03-23 MED ORDER — KETAMINE HCL 10 MG/ML IJ SOLN
INTRAMUSCULAR | Status: DC | PRN
  Administered 2014-03-23: 20 mg via INTRAVENOUS

## 2014-03-23 MED ORDER — LACTATED RINGERS IV SOLN: INTRAVENOUS | Status: DC

## 2014-03-23 MED ORDER — SODIUM CHLORIDE 0.9 % IV SOLN: INTRAVENOUS | Status: DC

## 2014-03-23 MED ORDER — GLYCOPYRROLATE 0.2 MG/ML IJ SOLN
INTRAMUSCULAR | Status: AC
  Filled 2014-03-23: qty 2

## 2014-03-23 MED ORDER — ONDANSETRON HCL 4 MG/2ML IJ SOLN
INTRAMUSCULAR | Status: AC
  Filled 2014-03-23: qty 2

## 2014-03-23 SURGICAL SUPPLY — 25 items

## 2014-03-23 NOTE — Discharge Instructions (Signed)
Esophagogastroduodenoscopy °Care After °Refer to this sheet in the next few weeks. These instructions provide you with information on caring for yourself after your procedure. Your caregiver may also give you more specific instructions. Your treatment has been planned according to current medical practices, but problems sometimes occur. Call your caregiver if you have any problems or questions after your procedure.  °HOME CARE INSTRUCTIONS °· Do not eat or drink anything until the numbing medicine (local anesthetic) has worn off and your gag reflex has returned. You will know that the local anesthetic has worn off when you can swallow comfortably. °· Do not drive for 12 hours after the procedure or as directed by your caregiver. °· Only take medicines as directed by your caregiver. °SEEK MEDICAL CARE IF:  °· You cannot stop coughing. °· You are not urinating at all or less than usual. °SEEK IMMEDIATE MEDICAL CARE IF: °· You have difficulty swallowing. °· You cannot eat or drink. °· You have worsening throat or chest pain. °· You have dizziness, lightheadedness, or you faint. °· You have nausea or vomiting. °· You have chills. °· You have a fever. °· You have severe abdominal pain. °· You have black, tarry, or bloody stools. °Document Released: 01/03/2012 Document Reviewed: 01/03/2012 °ExitCare® Patient Information ©2015 [employer]. This information is not intended to replace advice given to you by your health care provider. Make sure you discuss any questions you have with your health care provider. ° °Colonoscopy, Care After °Refer to this sheet in the next few weeks. These instructions provide you with information on caring for yourself after your procedure. Your health care provider may also give you more specific instructions. Your treatment has been planned according to current medical practices, but problems sometimes occur. Call your health care provider if you have any problems or questions after your  procedure. °WHAT TO EXPECT AFTER THE PROCEDURE  °After your procedure, it is typical to have the following: °· A small amount of blood in your stool. °· Moderate amounts of gas and mild abdominal cramping or bloating. °HOME CARE INSTRUCTIONS °· Do not drive, operate machinery, or sign important documents for 24 hours. °· You may shower and resume your regular physical activities, but move at a slower pace for the first 24 hours. °· Take frequent rest periods for the first 24 hours. °· Walk around or put a warm pack on your abdomen to help reduce abdominal cramping and bloating. °· Drink enough fluids to keep your urine clear or pale yellow. °· You may resume your normal diet as instructed by your health care provider. Avoid heavy or fried foods that are hard to digest. °· Avoid drinking alcohol for 24 hours or as instructed by your health care provider. °· Only take over-the-counter or prescription medicines as directed by your health care provider. °· If a tissue sample (biopsy) was taken during your procedure: °¨ Do not take aspirin or blood thinners for 7 days, or as instructed by your health care provider. °¨ Do not drink alcohol for 7 days, or as instructed by your health care provider. °¨ Eat soft foods for the first 24 hours. °SEEK MEDICAL CARE IF: °You have persistent spotting of blood in your stool 2-3 days after the procedure. °SEEK IMMEDIATE MEDICAL CARE IF: °· You have more than a small spotting of blood in your stool. °· You pass large blood clots in your stool. °· Your abdomen is swollen (distended). °· You have nausea or vomiting. °· You have a   fever. °· You have increasing abdominal pain that is not relieved with medicine. °Document Released: 08/31/2003 Document Revised: 11/06/2012 Document Reviewed: 09/23/2012 °ExitCare® Patient Information ©2015 [employer]. This information is not intended to replace advice given to you by your health care provider. Make sure you discuss any questions you have  with your health care provider. ° ° ° °

## 2014-03-23 NOTE — Interval H&P Note (Signed)
History and Physical Interval Note:  03/23/2014 9:10 AM  [name]  has presented today for surgery, with the diagnosis of Hematochezia, Hx of polyps  The various methods of treatment have been discussed with the patient and family. After consideration of risks, benefits and other options for treatment, the patient has consented to  Procedure(s): COLONOSCOPY WITH PROPOFOL (N/A) ESOPHAGOGASTRODUODENOSCOPY (EGD) WITH PROPOFOL (N/A) as a surgical intervention .  The patient's history has been reviewed, patient examined, no change in status, stable for surgery.  I have reviewed the patient's chart and labs.  Questions were answered to the patient's satisfaction.     Pricilla Riffle. Fuller Plan MD

## 2014-03-23 NOTE — H&P (View-Only) (Signed)
     03/11/2014 [name] [id number] 04/25/1940   History of Present Illness:  This is a 74 year old male who is known to Dr. Fuller Plan.  He was last seen in 12/2013 and was scheduled for a colonoscopy, which is set to take place on 2/22.  He presents to our office today to discuss some epigastric abdominal pain that he had been experiencing and to possibly add an EGD to his already scheduled colonoscopy per the request of his PCP, Dr. Loanne Drilling.  He says that a few weeks ago he started having tightness and burning as well as a dull ache across his upper abdomen.  He tried Tums with some relief.  He saw his PCP on 2/5 at which time labs were drawn; CBC, BMP, amylase, and LFT's were normal.  He was started on omeprazole 40 mg daily and he says that after taking the first dose he began to feel much better and has not had any further pain in the past 5 days.  He has also been avoiding spicy foods and trying to eat a very bland diet.    While he is here he also mentioned that he does intermittently see some bright red blood on the toilet paper and in the toilet bowl, which he has attributed to hemorrhoids.  This was discussed at his last visit in December when he was scheduled for colonoscopy as well.  He happened to see this for two days last week so wanted to mention it at this visit today as well.   Current Medications, Allergies, Past Medical History, Past Surgical History, Family History and Social History were reviewed in [employer] record.   Physical Exam: BP 140/60 mmHg  Pulse 76  Ht 6' 2.75" (1.899 m)  Wt 395 lb 6 oz (179.341 kg)  BMI 49.73 kg/m2 General: Well developed white male in no acute distress Head: Normocephalic and atraumatic Eyes:  Sclerae anicteric, conjunctiva pink  Ears: Normal auditory acuity Lungs: Clear throughout to auscultation Heart: Regular rate and rhythm Abdomen: Soft, obese, non-distended.  Normal bowel sounds.  Non-tender. Rectal:  Will  be done at the time of colonoscopy. Musculoskeletal: Symmetrical with no gross deformities  Extremities: No edema  Neurological: Alert oriented x 4, grossly non-focal Psychological:  Alert and cooperative. Normal mood and affect  Assessment and Recommendations: -Rectal bleeding  He is already scheduled for colonoscopy on 2/22 and this was mentioned/discussed at his previous visit.  Bleeding is intermittent and there has not been any bleeding in about a week.   -Epigastric abdominal pain:  Resolved almost completely on omeprazole 40 mg daily.  His PCP is requesting EGD be added to his colonoscopy procedure.  His symptoms were likely secondary to GERD, but will add EGD to his already scheduled colonoscopy.  The risks, benefits, and alternatives were discussed with the patient and he consents to proceed.  Continue omeprazole 40 mg daily for now.  cc: Dr. Loanne Drilling

## 2014-03-23 NOTE — Anesthesia Postprocedure Evaluation (Signed)
  Anesthesia Post-op Note  Patient: [name]  Procedure(s) Performed: Procedure(s): COLONOSCOPY WITH PROPOFOL (N/A) ESOPHAGOGASTRODUODENOSCOPY (EGD) WITH PROPOFOL (N/A)  Patient Location: PACU  Anesthesia Type: MAC  Level of Consciousness: awake and alert   Airway and Oxygen Therapy: Patient Spontanous Breathing  Post-op Pain: none  Post-op Assessment: Post-op Vital signs reviewed, Patient's Cardiovascular Status Stable and Respiratory Function Stable  Post-op Vital Signs: Reviewed  Filed Vitals:   03/23/14 1100  BP: 198/66  Pulse: 70  Temp:   Resp: 15    Complications: No apparent anesthesia complications

## 2014-03-23 NOTE — Transfer of Care (Signed)
Immediate Anesthesia Transfer of Care Note  Patient: [name]  Procedure(s) Performed: Procedure(s): COLONOSCOPY WITH PROPOFOL (N/A) ESOPHAGOGASTRODUODENOSCOPY (EGD) WITH PROPOFOL (N/A)  Patient Location: Endoscopy Unit  Anesthesia Type:MAC  Level of Consciousness: awake, alert  and oriented  Airway & Oxygen Therapy: Patient Spontanous Breathing and Patient connected to face mask oxygen  Post-op Assessment: Report given to RN  Post vital signs: Reviewed and stable  Last Vitals:  Filed Vitals:   03/23/14 0925  BP: 176/89  Temp: 37.1 C  Resp: 7    Complications: No apparent anesthesia complications

## 2014-03-23 NOTE — Op Note (Signed)
[facility] [place], [address]   COLONOSCOPY PROCEDURE REPORT  PATIENT: [name], [name]  MR#: [id number] BIRTHDATE: December 08, 1940 , 73  yrs. old GENDER: male ENDOSCOPIST: Ladene Artist, MD, [facility] PROCEDURE DATE:  03/23/2014 PROCEDURE:   Colonoscopy with snare polypectomy First Screening Colonoscopy - Avg.  risk and is 50 yrs.  old or older - No.  Prior Negative Screening - Now for repeat screening. N/A  History of Adenoma - Now for follow-up colonoscopy & has been > or = to 3 yrs.  Yes hx of adenoma.  Has been 3 or more years since last colonoscopy.  Polyps Removed Today? Yes. ASA CLASS:   Class III INDICATIONS:hematochezia and follow up of adenomatous colonic polyp(s). MEDICATIONS: Monitored anesthesia care and Per Anesthesia DESCRIPTION OF PROCEDURE:   After the risks benefits and alternatives of the procedure were thoroughly explained, informed consent was obtained.  The digital rectal exam revealed no abnormalities of the rectum.   The [id number] ([id number])  endoscope was introduced through the anus and advanced to the cecum, which was identified by both the appendix and ileocecal valve. No adverse events experienced.   The quality of the prep was good, using MoviPrep  The instrument was then slowly withdrawn as the colon was fully examined.    COLON FINDINGS: Two sessile polyps measuring 6 mm in size were found in the transverse colon and ascending colon.  Polypectomies were performed with a cold snare.  The resection was complete, the polyp tissue was completely retrieved and sent to histology.   The examination was otherwise normal.  Retroflexed views revealed internal Grade I hemorrhoids. The time to cecum=4 minutes 00 seconds.  Withdrawal time=13 minutes 00 seconds.  The scope was withdrawn and the procedure completed. COMPLICATIONS: There were no immediate complications.  ENDOSCOPIC IMPRESSION: 1.   Two sessile polyps in the transverse  colon and ascending colon; polypectomies performed with a cold snare 2.   Grade l internal hemorrhoids  RECOMMENDATIONS: 1.  Await pathology results 2.  Prep H supp PR daily as needed 3.  Repeat Colonoscopy in 5 years if health status appropriate  eSigned:  Ladene Artist, MD, [facility] 03/23/2014 10:18 AM   [C

## 2014-03-23 NOTE — Anesthesia Preprocedure Evaluation (Addendum)
Anesthesia Evaluation  Patient identified by MRN, date of birth, ID band Patient awake    Reviewed: Allergy & Precautions, H&P , NPO status , Patient's Chart, lab work & pertinent test results  Airway Mallampati: III  TM Distance: >3 FB Neck ROM: Full    Dental no notable dental hx. (+) Teeth Intact, Dental Advisory Given   Pulmonary sleep apnea , former smoker,  breath sounds clear to auscultation  Pulmonary exam normal       Cardiovascular hypertension, Pt. on medications and Pt. on home beta blockers + CAD, + CABG, + Peripheral Vascular Disease and +CHF Rhythm:Regular Rate:Normal     Neuro/Psych negative neurological ROS  negative psych ROS   GI/Hepatic negative GI ROS,   Endo/Other  diabetes, Type 1, Insulin Dependent  Renal/GU negative Renal ROS  negative genitourinary   Musculoskeletal  (+) Arthritis -,   Abdominal   Peds  Hematology negative hematology ROS (+)   Anesthesia Other Findings   Reproductive/Obstetrics negative OB ROS                            Anesthesia Physical Anesthesia Plan  ASA: III  Anesthesia Plan: MAC   Post-op Pain Management:    Induction: Intravenous  Airway Management Planned: Nasal Cannula  Additional Equipment:   Intra-op Plan:   Post-operative Plan:   Informed Consent: I have reviewed the patients History and Physical, chart, labs and discussed the procedure including the risks, benefits and alternatives for the proposed anesthesia with the patient or authorized representative who has indicated his/her understanding and acceptance.   Dental advisory given  Plan Discussed with: CRNA  Anesthesia Plan Comments:         Anesthesia Quick Evaluation

## 2014-03-23 NOTE — Progress Notes (Signed)
Cuff loose

## 2014-03-23 NOTE — Op Note (Signed)
[facility] [place], [address]   ENDOSCOPY PROCEDURE REPORT  PATIENT: Michael, Mcintyre  MR#: [id number] BIRTHDATE: [birth year] , 73  yrs. old GENDER: male ENDOSCOPIST: Ladene Artist, MD, [facility] PROCEDURE DATE:  03/23/2014 PROCEDURE:  EGD, diagnostic ASA CLASS:     Class III INDICATIONS:  epigastric pain and history of esophageal reflux. MEDICATIONS: Monitored anesthesia care, Residual sedation present, and Per Anesthesia TOPICAL ANESTHETIC: none DESCRIPTION OF PROCEDURE: After the risks benefits and alternatives of the procedure were thoroughly explained, informed consent was obtained.  The Pentax Gastroscope [id number] endoscope was introduced through the mouth and advanced to the second portion of the duodenum , Without limitations.  The instrument was slowly withdrawn as the mucosa was fully examined.    EXAM: The esophagus and gastroesophageal junction were completely normal in appearance.  The stomach was entered and closely examined.The antrum, angularis, and lesser curvature were well visualized, including a retroflexed view of the cardia and fundus. The stomach wall was normally distensable.  The scope passed easily through the pylorus into the duodenum.  Retroflexed views revealed no abnormalities.     The scope was then withdrawn from the patient and the procedure completed.  COMPLICATIONS: There were no immediate complications.  ENDOSCOPIC IMPRESSION: 1.  Normal appearing EGD  RECOMMENDATIONS: 1.  Anti-reflux regimen 2.  Continue PPI  eSigned:  Ladene Artist, MD, [facility] 03/23/2014 10:21 AM

## 2014-03-24 ENCOUNTER — Encounter: Payer: Self-pay | Admitting: Gastroenterology

## 2014-03-24 ENCOUNTER — Encounter (HOSPITAL_COMMUNITY): Payer: Self-pay | Admitting: Gastroenterology

## 2014-03-30 ENCOUNTER — Telehealth: Payer: Self-pay | Admitting: Endocrinology

## 2014-03-30 MED ORDER — OMEPRAZOLE 40 MG PO CPDR: 40.0000 mg | DELAYED_RELEASE_CAPSULE | Freq: Every day | ORAL | Status: DC

## 2014-03-30 MED ORDER — INSULIN LISPRO 100 UNIT/ML (KWIKPEN): PEN_INJECTOR | SUBCUTANEOUS | Status: DC

## 2014-03-30 MED ORDER — INSULIN ISOPHANE HUMAN 100 UNIT/ML KWIKPEN: PEN_INJECTOR | SUBCUTANEOUS | Status: DC

## 2014-03-30 NOTE — Telephone Encounter (Signed)
Rx sent to mail order pharmacy per request.

## 2014-03-30 NOTE — Telephone Encounter (Signed)
PT SAID THE AMOUNT OF INSULIN IN SCRIPT IS IN CORECT IT IS ONLY GIVIEN 25 DAY SUPPLY IT SHOULD BE 30, HE WANTS IT RESUBMITTED TO HUMAN MAIL ORDER FOR 90 DAYS SUPPLU AT 75 ML PR MONTH  SCRIPT IS HUMALOG QUICK PEN   ALSO NEEDS OMEPRAZOLE DR 40MG   CER SWITCH TO MAIL ORDER 90 DAY SUPPLY ALSO.   BED TIME INSULIN HUMLIN- M NEEDS TO FOR TO HUMAN MAIL ORDER ALSO

## 2014-03-31 ENCOUNTER — Ambulatory Visit: Payer: Medicare Other | Admitting: Endocrinology

## 2014-04-08 DIAGNOSIS — H02834 Dermatochalasis of left upper eyelid: Secondary | ICD-10-CM | POA: Diagnosis not present

## 2014-04-08 DIAGNOSIS — H0231 Blepharochalasis right upper eyelid: Secondary | ICD-10-CM | POA: Diagnosis not present

## 2014-04-08 DIAGNOSIS — H02831 Dermatochalasis of right upper eyelid: Secondary | ICD-10-CM | POA: Diagnosis not present

## 2014-04-08 DIAGNOSIS — H0234 Blepharochalasis left upper eyelid: Secondary | ICD-10-CM | POA: Diagnosis not present

## 2014-04-14 DIAGNOSIS — G4733 Obstructive sleep apnea (adult) (pediatric): Secondary | ICD-10-CM | POA: Diagnosis not present

## 2014-04-15 DIAGNOSIS — J984 Other disorders of lung: Secondary | ICD-10-CM | POA: Diagnosis not present

## 2014-04-15 DIAGNOSIS — R0600 Dyspnea, unspecified: Secondary | ICD-10-CM | POA: Diagnosis not present

## 2014-04-15 DIAGNOSIS — G4733 Obstructive sleep apnea (adult) (pediatric): Secondary | ICD-10-CM | POA: Diagnosis not present

## 2014-04-22 DIAGNOSIS — H02132 Senile ectropion of right lower eyelid: Secondary | ICD-10-CM | POA: Diagnosis not present

## 2014-04-22 DIAGNOSIS — H53483 Generalized contraction of visual field, bilateral: Secondary | ICD-10-CM | POA: Diagnosis not present

## 2014-04-22 DIAGNOSIS — H02135 Senile ectropion of left lower eyelid: Secondary | ICD-10-CM | POA: Diagnosis not present

## 2014-04-22 DIAGNOSIS — H02831 Dermatochalasis of right upper eyelid: Secondary | ICD-10-CM | POA: Diagnosis not present

## 2014-04-22 DIAGNOSIS — H02834 Dermatochalasis of left upper eyelid: Secondary | ICD-10-CM | POA: Diagnosis not present

## 2014-05-07 ENCOUNTER — Encounter: Payer: Self-pay | Admitting: Endocrinology

## 2014-05-07 ENCOUNTER — Other Ambulatory Visit: Payer: Self-pay

## 2014-05-07 ENCOUNTER — Ambulatory Visit (INDEPENDENT_AMBULATORY_CARE_PROVIDER_SITE_OTHER): Payer: Medicare Other | Admitting: Endocrinology

## 2014-05-07 VITALS — BP 140/84 | HR 90 | Temp 97.9°F | Ht 75.0 in | Wt >= 6400 oz

## 2014-05-07 DIAGNOSIS — E1042 Type 1 diabetes mellitus with diabetic polyneuropathy: Secondary | ICD-10-CM

## 2014-05-07 LAB — HEMOGLOBIN A1C: Hgb A1c MFr Bld: 7.7 % — ABNORMAL HIGH (ref 4.6–6.5)

## 2014-05-07 MED ORDER — INSULIN PEN NEEDLE 31G X 5 MM MISC: Status: DC

## 2014-05-07 NOTE — Progress Notes (Signed)
Subjective:    Patient ID: [name], male    DOB: October 04, 1940, 74 y.o.   MRN: [id number]  HPI Pt returns for f/u of diabetes mellitus:  DM type: insulin-requiring type 2. Dx'ed: 4825 Complications: CAD, nephropathy, and peripheral sensory neuropathy. Therapy: insulin since 2004.   DKA: never.   Severe hypoglycemia: never.   Pancreatitis: never.   Other: he takes multiple daily injections. He declines weight-loss surgery.   Interval history: He takes the full dosage of NPH.  However, he varies humalog 60 units 3 times a day (just before each meal) (except he sometimes takes more with the evening meal).  He has not recently checked cbg.  He will have eyelid surgery soon.   Past Medical History  Diagnosis Date  . Arthritis   . Hyperlipidemia   . Multinodular goiter   . Sleep apnea   . NASH (nonalcoholic steatohepatitis)   . DM retinopathy   . ED (erectile dysfunction)   . Colitis 1997    nonspecific  . Anemia, unspecified   . Diabetes mellitus     Type I  . DM peripheral angiopathy   . Tubular adenoma of colon   . Osteoarthritis     Past Surgical History  Procedure Laterality Date  . Appendectomy    . Coronary artery bypass graft  2007    x 1  . Thoracotomy  2005    S/P Right Throactomy for Empyema  . Esophagogastroduodenoscopy  02/04/2003    normal  . Colonoscopy with propofol N/A 03/23/2014    Procedure: COLONOSCOPY WITH PROPOFOL;  Surgeon: Ladene Artist, MD;  Location: [facility] ENDOSCOPY;  Service: Endoscopy;  Laterality: N/A;  . Esophagogastroduodenoscopy (egd) with propofol N/A 03/23/2014    Procedure: ESOPHAGOGASTRODUODENOSCOPY (EGD) WITH PROPOFOL;  Surgeon: Ladene Artist, MD;  Location: [facility] ENDOSCOPY;  Service: Endoscopy;  Laterality: N/A;    History   Social History  . Marital Status: Married    Spouse Name: N/A  . Number of Children: N/A  . Years of Education: N/A   Occupational History  . Retired     laid off in 2010   Social History Main Topics    . Smoking status: Former Smoker    Types: Cigarettes    Quit date: 03/21/1993  . Smokeless tobacco: Not on file  . Alcohol Use: Yes     Comment: rare  . Drug Use: No  . Sexual Activity: Not on file   Other Topics Concern  . Not on file   Social History Narrative   Regular exercise-swims once day   Daily Caffeine Use: 1-2 cups per day    Current Outpatient Prescriptions on File Prior to Visit  Medication Sig Dispense Refill  . allopurinol (ZYLOPRIM) 300 MG tablet Take 1 tablet (300 mg total) by mouth daily. 30 tablet 0  . aspirin (ASPIRIN ADULT LOW STRENGTH) 81 MG EC tablet Take 81 mg by mouth once.     . BD ULTRA-FINE PEN NEEDLES 29G X 12.7MM MISC USE 1 PEN NEEDLE AS DIRECTED TO INJECT AS NEEDED 270 each 2  . beta carotene w/minerals (OCUVITE) tablet Take 1 tablet by mouth daily.    . clotrimazole-betamethasone (LOTRISONE) cream Apply topically 2 (two) times daily. 90 g 3  . Coenzyme Q10 (COQ10 PO) Take 1 tablet by mouth daily.    Marland Kitchen desonide (DESOWEN) 0.05 % ointment Apply 1 application topically daily as needed (skin irrtation).     Marland Kitchen econazole nitrate 1 % cream Apply 1 application  topically daily as needed (skin irritation).     . fluocinonide (LIDEX) 0.05 % external solution Apply 1 application topically daily as needed (Skin irritation).     . furosemide (LASIX) 40 MG tablet Take 1 tablet (40 mg total) by mouth daily. 90 tablet 3  . HYDROcodone-acetaminophen (NORCO/VICODIN) 5-325 MG per tablet Take 1 tablet by mouth every 4 (four) hours as needed for pain. For pain 50 tablet 3  . insulin lispro (HUMALOG KWIKPEN) 100 UNIT/ML KiwkPen 60 units at breakfast and lunch and 110 units at night. 55 pen 3  . Insulin NPH, Human,, Isophane, (HUMULIN N KWIKPEN) 100 UNIT/ML Kiwkpen INJECT 120 UNITS AT BEDTIME 40 pen 2  . Insulin Pen Needle 29G X 12MM MISC Use 1 as directed to inject as needed. 100 each 2  . Insulin Pen Needle 32G X 4 MM MISC Use 1 per day 90 each 4  . Multiple Vitamin  (MULTIVITAMIN) tablet Take 1 tablet by mouth daily.      . peg 3350 powder (MOVIPREP) 100 G SOLR Take 1 kit (200 g total) by mouth once. 1 kit 0  . simvastatin (ZOCOR) 40 MG tablet Take 1 tablet (40 mg total) by mouth daily. 90 tablet 3  . tamsulosin (FLOMAX) 0.4 MG CAPS capsule Take 1 capsule (0.4 mg total) by mouth daily. 90 capsule 3   No current facility-administered medications on file prior to visit.    No Known Allergies  Family History  Problem Relation Age of Onset  . Crohn's disease Father   . Colon cancer Neg Hx     BP 140/84 mmHg  Pulse 90  Temp(Src) 97.9 F (36.6 C) (Oral)  Ht _0  (1.905 m)  Wt 405 lb (183.707 kg)  BMI 50.62 kg/m2  SpO2 94%  Review of Systems He denies hypoglycemia and weight change.    Objective:   Physical Exam VITAL SIGNS:  See vs page GENERAL: no distress.  Morbid obesity Pulses: dorsalis pedis intact bilat.   MSK: no deformity of the feet CV: 1+ bilat leg edema Skin:  no ulcer on the feet.  normal color and temp on the feet.  Patchy hyperpigmentation at the anterior tibial areas Neuro: sensation is intact to touch on the feet, but decreased from normal Ext: There is bilateral onychomycosis of the toenails   Lab Results  Component Value Date   HGBA1C 7.7* 05/07/2014      Assessment & Plan:  DM: improved control: your surgical risk is low and outweighed by the potential benefit of the surgery.  You are therefore medically cleared. HTN: mild exacerbation: we'll follow Morbid obesity: unchanged.  In this setting, i hesitate to increase insulin further.    Patient is advised the following: Patient Instructions  check your blood sugar twice a day.  vary the time of day when you check, between before the 3 meals, and at bedtime.  also check if you have symptoms of your blood sugar being too high or too low.  please keep a record of the readings and bring it to your next appointment here.  You can write it on any piece of paper.  please  call us sooner if your blood sugar goes below 70, or if you have a lot of readings over 200.    blood tests are being requested for you today.  We'll let you know about the results.   Please come back for a "medicare wellness" appointment in 3 months.   You will need to sign  an "ABN" for the blood tests (CMET, CBC, PT and PTT), because you had them just 2 months ago.     addendum: Please continue the same insulins.

## 2014-05-07 NOTE — Patient Instructions (Addendum)
check your blood sugar twice a day.  vary the time of day when you check, between before the 3 meals, and at bedtime.  also check if you have symptoms of your blood sugar being too high or too low.  please keep a record of the readings and bring it to your next appointment here.  You can write it on any piece of paper.  please call us sooner if your blood sugar goes below 70, or if you have a lot of readings over 200.    blood tests are being requested for you today.  We'll let you know about the results.   Please come back for a "medicare wellness" appointment in 3 months.   You will need to sign an "ABN" for the blood tests (CMET, CBC, PT and PTT), because you had them just 2 months ago.

## 2014-06-01 DIAGNOSIS — Z01818 Encounter for other preprocedural examination: Secondary | ICD-10-CM | POA: Diagnosis not present

## 2014-06-03 ENCOUNTER — Telehealth: Payer: Self-pay | Admitting: Endocrinology

## 2014-06-03 NOTE — Telephone Encounter (Signed)
Pt notified that form has been completed and faxed to [employer].

## 2014-06-03 NOTE — Telephone Encounter (Signed)
please call patient: We have received a form for cardiac clearance for your eye surgery.   However, i can't see that you have recently seen a heart doctor. Is it ok for me to refer you back to cardiol?

## 2014-06-03 NOTE — Telephone Encounter (Signed)
Ann white a nurse from [employer], need last EKG from  Patient phone# [phone] Fax # [phone]

## 2014-06-03 NOTE — Telephone Encounter (Signed)
I contacted the patient. He stated he went and had a stress test done back in November. Patient wanted to know if this test would be sufficient enough for the paper work to be filled out, or if he needs to follow up with a Cardiologist.  Please advise, Thanks!

## 2014-06-03 NOTE — Telephone Encounter (Signed)
Ok, i did the form

## 2014-06-04 ENCOUNTER — Ambulatory Visit: Payer: Medicare Other | Admitting: Endocrinology

## 2014-06-06 ENCOUNTER — Other Ambulatory Visit: Payer: Self-pay | Admitting: Endocrinology

## 2014-06-12 DIAGNOSIS — H02831 Dermatochalasis of right upper eyelid: Secondary | ICD-10-CM | POA: Diagnosis not present

## 2014-06-12 DIAGNOSIS — H02835 Dermatochalasis of left lower eyelid: Secondary | ICD-10-CM | POA: Diagnosis not present

## 2014-06-12 DIAGNOSIS — G4730 Sleep apnea, unspecified: Secondary | ICD-10-CM | POA: Diagnosis not present

## 2014-06-12 DIAGNOSIS — H02832 Dermatochalasis of right lower eyelid: Secondary | ICD-10-CM | POA: Diagnosis not present

## 2014-06-12 DIAGNOSIS — Z951 Presence of aortocoronary bypass graft: Secondary | ICD-10-CM | POA: Diagnosis not present

## 2014-06-12 DIAGNOSIS — H53483 Generalized contraction of visual field, bilateral: Secondary | ICD-10-CM | POA: Diagnosis not present

## 2014-06-12 DIAGNOSIS — H0231 Blepharochalasis right upper eyelid: Secondary | ICD-10-CM | POA: Diagnosis not present

## 2014-06-12 DIAGNOSIS — E119 Type 2 diabetes mellitus without complications: Secondary | ICD-10-CM | POA: Diagnosis not present

## 2014-06-12 DIAGNOSIS — H0234 Blepharochalasis left upper eyelid: Secondary | ICD-10-CM | POA: Diagnosis not present

## 2014-06-12 DIAGNOSIS — R0602 Shortness of breath: Secondary | ICD-10-CM | POA: Diagnosis not present

## 2014-06-12 DIAGNOSIS — I25810 Atherosclerosis of coronary artery bypass graft(s) without angina pectoris: Secondary | ICD-10-CM | POA: Diagnosis not present

## 2014-06-12 DIAGNOSIS — H02834 Dermatochalasis of left upper eyelid: Secondary | ICD-10-CM | POA: Diagnosis not present

## 2014-06-12 DIAGNOSIS — I2510 Atherosclerotic heart disease of native coronary artery without angina pectoris: Secondary | ICD-10-CM | POA: Diagnosis not present

## 2014-07-07 ENCOUNTER — Ambulatory Visit: Payer: Medicare Other | Admitting: Endocrinology

## 2014-08-06 ENCOUNTER — Ambulatory Visit (INDEPENDENT_AMBULATORY_CARE_PROVIDER_SITE_OTHER): Payer: Medicare Other | Admitting: Endocrinology

## 2014-08-06 ENCOUNTER — Encounter: Payer: Self-pay | Admitting: Endocrinology

## 2014-08-06 VITALS — BP 130/84 | HR 86 | Temp 97.3°F | Wt 398.0 lb

## 2014-08-06 DIAGNOSIS — E1042 Type 1 diabetes mellitus with diabetic polyneuropathy: Secondary | ICD-10-CM | POA: Diagnosis not present

## 2014-08-06 LAB — POCT GLYCOSYLATED HEMOGLOBIN (HGB A1C): Hemoglobin A1C: 8.8

## 2014-08-06 MED ORDER — INSULIN LISPRO 100 UNIT/ML (KWIKPEN): PEN_INJECTOR | SUBCUTANEOUS | Status: DC

## 2014-08-06 MED ORDER — INSULIN ISOPHANE HUMAN 100 UNIT/ML KWIKPEN: 140.0000 [IU] | PEN_INJECTOR | Freq: Every day | SUBCUTANEOUS | Status: DC

## 2014-08-06 NOTE — Patient Instructions (Signed)
Please increase insulin to the numbers listed below.  check your blood sugar twice a day.  vary the time of day when you check, between before the 3 meals, and at bedtime.  also check if you have symptoms of your blood sugar being too high or too low.  please keep a record of the readings and bring it to your next appointment here.  You can write it on any piece of paper.  please call us sooner if your blood sugar goes below 70, or if you have a lot of readings over 200. Please come back for a "medicare wellness" appointment in 3 months

## 2014-08-06 NOTE — Progress Notes (Signed)
Subjective:    Patient ID: [name], male    DOB: 1940/12/25, 74 y.o.   MRN: [id number]  HPI Pt returns for f/u of diabetes mellitus:  DM type: insulin-requiring type 2. Dx'ed: 3664 Complications: CAD, nephropathy, and peripheral sensory neuropathy.   Therapy: insulin since 2004.   DKA: never.   Severe hypoglycemia: never.   Pancreatitis: never.   Other: he takes multiple daily injections. He declines weight-loss surgery.   Interval history: He takes the full dosage of NPH and humalog, and says he never misses it.  He does not check cbg's.  pt states he feels well in general.  Past Medical History  Diagnosis Date  . Arthritis   . Hyperlipidemia   . Multinodular goiter   . Sleep apnea   . NASH (nonalcoholic steatohepatitis)   . DM retinopathy   . ED (erectile dysfunction)   . Colitis 1997    nonspecific  . Anemia, unspecified   . Diabetes mellitus     Type I  . DM peripheral angiopathy   . Tubular adenoma of colon   . Osteoarthritis     Past Surgical History  Procedure Laterality Date  . Appendectomy    . Coronary artery bypass graft  2007    x 1  . Thoracotomy  2005    S/P Right Throactomy for Empyema  . Esophagogastroduodenoscopy  02/04/2003    normal  . Colonoscopy with propofol N/A 03/23/2014    Procedure: COLONOSCOPY WITH PROPOFOL;  Surgeon: Ladene Artist, MD;  Location: [facility];  Service: Endoscopy;  Laterality: N/A;  . Esophagogastroduodenoscopy (egd) with propofol N/A 03/23/2014    Procedure: ESOPHAGOGASTRODUODENOSCOPY (EGD) WITH PROPOFOL;  Surgeon: Ladene Artist, MD;  Location: [facility];  Service: Endoscopy;  Laterality: N/A;    History   Social History  . Marital Status: Married    Spouse Name: N/A  . Number of Children: N/A  . Years of Education: N/A   Occupational History  . Retired     laid off in 2010   Social History Main Topics  . Smoking status: Former Smoker    Types: Cigarettes    Quit date: 03/21/1993  . Smokeless  tobacco: Not on file  . Alcohol Use: Yes     Comment: rare  . Drug Use: No  . Sexual Activity: Not on file   Other Topics Concern  . Not on file   Social History Narrative   Regular exercise-swims once day   Daily Caffeine Use: 1-2 cups per day    Current Outpatient Prescriptions on File Prior to Visit  Medication Sig Dispense Refill  . allopurinol (ZYLOPRIM) 300 MG tablet TAKE 1 TABLET EVERY DAY 90 tablet 3  . aspirin (ASPIRIN ADULT LOW STRENGTH) 81 MG EC tablet Take 81 mg by mouth once.     . BD ULTRA-FINE PEN NEEDLES 29G X 12.7MM MISC USE 1 PEN NEEDLE AS DIRECTED TO INJECT AS NEEDED 270 each 2  . beta carotene w/minerals (OCUVITE) tablet Take 1 tablet by mouth daily.    . clotrimazole-betamethasone (LOTRISONE) cream Apply topically 2 (two) times daily. 90 g 3  . Coenzyme Q10 (COQ10 PO) Take 1 tablet by mouth daily.    Marland Kitchen desonide (DESOWEN) 0.05 % ointment Apply 1 application topically daily as needed (skin irrtation).     Marland Kitchen econazole nitrate 1 % cream Apply 1 application topically daily as needed (skin irritation).     . fluocinonide (LIDEX) 0.05 % external solution Apply 1 application  topically daily as needed (Skin irritation).     . furosemide (LASIX) 40 MG tablet Take 1 tablet (40 mg total) by mouth daily. 90 tablet 3  . Insulin Pen Needle (B-D UF III MINI PEN NEEDLES) 31G X 5 MM MISC INJECT 1  PEN NEEDLE INTO THE SKIN AS NEEDED AS DIRECTED 540 each 1  . Insulin Pen Needle 29G X 12MM MISC Use 1 as directed to inject as needed. 100 each 2  . Insulin Pen Needle 32G X 4 MM MISC Use 1 per day 90 each 4  . Multiple Vitamin (MULTIVITAMIN) tablet Take 1 tablet by mouth daily.      . peg 3350 powder (MOVIPREP) 100 G SOLR Take 1 kit (200 g total) by mouth once. 1 kit 0  . simvastatin (ZOCOR) 40 MG tablet Take 1 tablet (40 mg total) by mouth daily. 90 tablet 3  . tamsulosin (FLOMAX) 0.4 MG CAPS capsule Take 1 capsule (0.4 mg total) by mouth daily. 90 capsule 3   No current  facility-administered medications on file prior to visit.    No Known Allergies  Family History  Problem Relation Age of Onset  . Crohn's disease Father   . Colon cancer Neg Hx     BP 130/84 mmHg  Pulse 86  Temp(Src) 97.3 F (36.3 C) (Oral)  Wt 398 lb (180.532 kg)  SpO2 95%  Review of Systems He denies hypoglycemia and weight change.      Objective:   Physical Exam VITAL SIGNS:  See vs page GENERAL: no distress.  Morbid obesity.  Pulses: dorsalis pedis intact bilat.  MSK: no deformity of the feet CV: 1+ bilat leg edema Skin: no ulcer on the feet. normal color and temp on the feet. Patchy hyperpigmentation at the anterior tibial areas Neuro: sensation is intact to touch on the feet, but decreased from normal Ext: There is bilateral onychomycosis of the toenails.    A1c=8.8.    Assessment & Plan:  DM: he needs increased rx   Patient is advised the following: Patient Instructions  Please increase insulin to the numbers listed below.  check your blood sugar twice a day.  vary the time of day when you check, between before the 3 meals, and at bedtime.  also check if you have symptoms of your blood sugar being too high or too low.  please keep a record of the readings and bring it to your next appointment here.  You can write it on any piece of paper.  please call us sooner if your blood sugar goes below 70, or if you have a lot of readings over 200. Please come back for a "medicare wellness" appointment in 3 months

## 2014-08-18 ENCOUNTER — Telehealth: Payer: Self-pay | Admitting: Endocrinology

## 2014-08-18 ENCOUNTER — Other Ambulatory Visit: Payer: Self-pay

## 2014-08-18 MED ORDER — SIMVASTATIN 40 MG PO TABS: 40.0000 mg | ORAL_TABLET | Freq: Every day | ORAL | Status: DC

## 2014-08-18 MED ORDER — FUROSEMIDE 40 MG PO TABS: 40.0000 mg | ORAL_TABLET | Freq: Every day | ORAL | Status: DC

## 2014-08-18 NOTE — Telephone Encounter (Signed)
Pt needs symvastatin and furosemide called into [facility] 90 day supply on both

## 2014-08-27 DIAGNOSIS — L219 Seborrheic dermatitis, unspecified: Secondary | ICD-10-CM | POA: Diagnosis not present

## 2014-08-27 DIAGNOSIS — L820 Inflamed seborrheic keratosis: Secondary | ICD-10-CM | POA: Diagnosis not present

## 2014-08-27 DIAGNOSIS — L570 Actinic keratosis: Secondary | ICD-10-CM | POA: Diagnosis not present

## 2014-09-30 ENCOUNTER — Telehealth: Payer: Self-pay

## 2014-09-30 NOTE — Telephone Encounter (Signed)
Pt will get A1c rechecked on f/u 11/06/2014

## 2014-10-14 DIAGNOSIS — G4733 Obstructive sleep apnea (adult) (pediatric): Secondary | ICD-10-CM | POA: Diagnosis not present

## 2014-10-16 DIAGNOSIS — Z6841 Body Mass Index (BMI) 40.0 and over, adult: Secondary | ICD-10-CM | POA: Diagnosis not present

## 2014-10-16 DIAGNOSIS — J984 Other disorders of lung: Secondary | ICD-10-CM | POA: Diagnosis not present

## 2014-10-16 DIAGNOSIS — G4733 Obstructive sleep apnea (adult) (pediatric): Secondary | ICD-10-CM | POA: Diagnosis not present

## 2014-10-21 DIAGNOSIS — G4733 Obstructive sleep apnea (adult) (pediatric): Secondary | ICD-10-CM | POA: Diagnosis not present

## 2014-10-21 DIAGNOSIS — E119 Type 2 diabetes mellitus without complications: Secondary | ICD-10-CM | POA: Diagnosis not present

## 2014-10-27 DIAGNOSIS — E119 Type 2 diabetes mellitus without complications: Secondary | ICD-10-CM | POA: Diagnosis not present

## 2014-11-06 ENCOUNTER — Ambulatory Visit (INDEPENDENT_AMBULATORY_CARE_PROVIDER_SITE_OTHER): Payer: Medicare Other | Admitting: Endocrinology

## 2014-11-06 ENCOUNTER — Other Ambulatory Visit: Payer: Self-pay

## 2014-11-06 ENCOUNTER — Encounter: Payer: Self-pay | Admitting: Endocrinology

## 2014-11-06 VITALS — BP 143/66 | HR 75 | Temp 97.7°F | Ht 75.0 in | Wt 385.0 lb

## 2014-11-06 DIAGNOSIS — E1142 Type 2 diabetes mellitus with diabetic polyneuropathy: Secondary | ICD-10-CM

## 2014-11-06 DIAGNOSIS — Z794 Long term (current) use of insulin: Secondary | ICD-10-CM

## 2014-11-06 DIAGNOSIS — Z23 Encounter for immunization: Secondary | ICD-10-CM | POA: Diagnosis not present

## 2014-11-06 DIAGNOSIS — E1042 Type 1 diabetes mellitus with diabetic polyneuropathy: Secondary | ICD-10-CM

## 2014-11-06 LAB — POCT GLYCOSYLATED HEMOGLOBIN (HGB A1C): HEMOGLOBIN A1C: 8.4

## 2014-11-06 MED ORDER — SIMVASTATIN 40 MG PO TABS: 40.0000 mg | ORAL_TABLET | Freq: Every day | ORAL | Status: DC

## 2014-11-06 MED ORDER — INSULIN ISOPHANE HUMAN 100 UNIT/ML KWIKPEN: 120.0000 [IU] | PEN_INJECTOR | Freq: Every day | SUBCUTANEOUS | Status: DC

## 2014-11-06 MED ORDER — INSULIN LISPRO 100 UNIT/ML (KWIKPEN): PEN_INJECTOR | SUBCUTANEOUS | Status: DC

## 2014-11-06 MED ORDER — FUROSEMIDE 40 MG PO TABS: 40.0000 mg | ORAL_TABLET | Freq: Every day | ORAL | Status: DC

## 2014-11-06 NOTE — Progress Notes (Signed)
we discussed code status.  pt requests full code. 

## 2014-11-06 NOTE — Progress Notes (Signed)
Subjective:    Patient ID: [name], male    DOB: 11-Aug-1940, 74 y.o.   MRN: [id number]  HPI Pt returns for f/u of diabetes mellitus:  DM type: insulin-requiring type 2. Dx'ed: 9024 Complications: CAD, nephropathy, and peripheral sensory neuropathy.   Therapy: insulin since 2004.   DKA: never.   Severe hypoglycemia: never.   Pancreatitis: never.   Other: he takes multiple daily injections. He declines weight-loss surgery.   Interval history: Pt says he has a renewed dietary effort, and has lost a few lbs.  He takes humalog 3 times a day (just before each meal), 80-80-120 units, and NPH, 120 units qhs.  no cbg record, but states cbg's avg in the high-100's.  He seldom checks cbg's.   Past Medical History  Diagnosis Date  . Arthritis   . Hyperlipidemia   . Multinodular goiter   . Sleep apnea   . NASH (nonalcoholic steatohepatitis)   . DM retinopathy ([place])   . ED (erectile dysfunction)   . Colitis 1997    nonspecific  . Anemia, unspecified   . Diabetes mellitus     Type I  . DM peripheral angiopathy ([place])   . Tubular adenoma of colon   . Osteoarthritis     Past Surgical History  Procedure Laterality Date  . Appendectomy    . Coronary artery bypass graft  2007    x 1  . Thoracotomy  2005    S/P Right Throactomy for Empyema  . Esophagogastroduodenoscopy  02/04/2003    normal  . Colonoscopy with propofol N/A 03/23/2014    Procedure: COLONOSCOPY WITH PROPOFOL;  Surgeon: Ladene Artist, MD;  Location: [facility];  Service: Endoscopy;  Laterality: N/A;  . Esophagogastroduodenoscopy (egd) with propofol N/A 03/23/2014    Procedure: ESOPHAGOGASTRODUODENOSCOPY (EGD) WITH PROPOFOL;  Surgeon: Ladene Artist, MD;  Location: [facility];  Service: Endoscopy;  Laterality: N/A;    Social History   Social History  . Marital Status: Married    Spouse Name: N/A  . Number of Children: N/A  . Years of Education: N/A   Occupational History  . Retired     laid off in 2010     Social History Main Topics  . Smoking status: Former Smoker    Types: Cigarettes    Quit date: 03/21/1993  . Smokeless tobacco: Not on file  . Alcohol Use: Yes     Comment: rare  . Drug Use: No  . Sexual Activity: Not on file   Other Topics Concern  . Not on file   Social History Narrative   Regular exercise-swims once day   Daily Caffeine Use: 1-2 cups per day    Current Outpatient Prescriptions on File Prior to Visit  Medication Sig Dispense Refill  . allopurinol (ZYLOPRIM) 300 MG tablet TAKE 1 TABLET EVERY DAY 90 tablet 3  . aspirin (ASPIRIN ADULT LOW STRENGTH) 81 MG EC tablet Take 81 mg by mouth once.     . BD ULTRA-FINE PEN NEEDLES 29G X 12.7MM MISC USE 1 PEN NEEDLE AS DIRECTED TO INJECT AS NEEDED 270 each 2  . beta carotene w/minerals (OCUVITE) tablet Take 1 tablet by mouth daily.    . clotrimazole-betamethasone (LOTRISONE) cream Apply topically 2 (two) times daily. 90 g 3  . desonide (DESOWEN) 0.05 % ointment Apply 1 application topically daily as needed (skin irrtation).     Marland Kitchen econazole nitrate 1 % cream Apply 1 application topically daily as needed (skin irritation).     Marland Kitchen  fluocinonide (LIDEX) 0.05 % external solution Apply 1 application topically daily as needed (Skin irritation).     . Insulin Pen Needle (B-D UF III MINI PEN NEEDLES) 31G X 5 MM MISC INJECT 1  PEN NEEDLE INTO THE SKIN AS NEEDED AS DIRECTED 540 each 1  . Insulin Pen Needle 29G X 12MM MISC Use 1 as directed to inject as needed. 100 each 2  . Insulin Pen Needle 32G X 4 MM MISC Use 1 per day 90 each 4  . Multiple Vitamin (MULTIVITAMIN) tablet Take 1 tablet by mouth daily.      . peg 3350 powder (MOVIPREP) 100 G SOLR Take 1 kit (200 g total) by mouth once. 1 kit 0  . tamsulosin (FLOMAX) 0.4 MG CAPS capsule Take 1 capsule (0.4 mg total) by mouth daily. 90 capsule 3  . Coenzyme Q10 (COQ10 PO) Take 1 tablet by mouth daily.     No current facility-administered medications on file prior to visit.    No  Known Allergies  Family History  Problem Relation Age of Onset  . Crohn's disease Father   . Colon cancer Neg Hx     BP 143/66 mmHg  Pulse 75  Temp(Src) 97.7 F (36.5 C) (Oral)  Ht _0  (1.905 m)  Wt 385 lb (174.635 kg)  BMI 48.12 kg/m2  SpO2 95%  Review of Systems He denies hypoglycemia.      Objective:   Physical Exam VITAL SIGNS:  See vs page.  GENERAL: no distress. Pulses: dorsalis pedis intact bilat.   MSK: no deformity of the feet.   CV: 2+ bilat leg edema.  Skin: no ulcer on the feet. normal color and temp on the feet. Patchy hyperpigmentation at the anterior tibial areas.   Neuro: sensation is intact to touch on the feet, but decreased from normal.   Ext: There is bilateral onychomycosis of the toenails.    A1c=8.4%    Assessment & Plan:  DM: i advised increased insulin, but he declines.     Subjective:   Patient here for Medicare annual wellness visit and management of other chronic and acute problems.      Risk factors: advanced age.     Roster of Physicians Providing Medical Care to Patient:  See "snapshot"    Activities of Daily Living: In your present state of health, do you have any difficulty performing the following activities?:  Preparing food and eating?: No  Bathing yourself: No  Getting dressed: No  Using the toilet:No  Moving around from place to place: No  In the past year have you fallen or had a near fall?: No    Home Safety: Has smoke detector and wears seat belts. No firearms. No excess sun exposure.  Diet and Exercise  Current exercise habits: [t says good Dietary issues discussed: sees bariatric clinic at [facility]  Depression Screen  Q1: Over the past two weeks, have you felt down, depressed or hopeless? no  Q2: Over the past two weeks, have you felt little interest or pleasure in doing things? no   The following portions of the patient's history were reviewed and updated as appropriate: allergies, current medications,  past family history, past medical history, past social history, past surgical history and problem list.   Review of Systems  Denies hearing loss, and visual loss Objective:   Vision:  Advertising account executive, but does not recall name Hearing: grossly normal Body mass index:  See vs page Msk: pt easily but slowly performs "get-up-and-go"  from a sitting position Cognitive Impairment Assessment: cognition, memory and judgment appear normal.  remembers 3/3 at 5 minutes.  excellent recall.  can easily read and write a sentence.  alert and oriented x 3.    Assessment:   Medicare wellness utd on preventive parameters    Plan:   During the course of the visit the patient was educated and counseled about appropriate screening and preventive services including:       Fall prevention    Diabetes screening  Nutrition counseling   Vaccines / LABS Zostavax / Pneumococcal Vaccine  today  PSA  Patient Instructions (the written plan) was given to the patient.

## 2014-11-06 NOTE — Patient Instructions (Addendum)
check your blood sugar twice a day.  vary the time of day when you check, between before the 3 meals, and at bedtime.  also check if you have symptoms of your blood sugar being too high or too low.  please keep a record of the readings and bring it to your next appointment here.  You can write it on any piece of paper.  please call us sooner if your blood sugar goes below 70, or if you have a lot of readings over 200.   Please continue the same insulins. Please come back for a follow-up appointment in 4 months. please consider these measures for your health:  minimize alcohol.  do not use tobacco products.  have a colonoscopy at least every 10 years from age 19.  keep firearms safely stored.  always use seat belts.  have working smoke alarms in your home.  see an eye doctor and dentist regularly.  never drive under the influence of alcohol or drugs (including prescription drugs).  those with fair skin should take precautions against the sun. it is critically important to prevent falling down (keep floor areas well-lit, dry, and free of loose objects.  If you have a cane, walker, or wheelchair, you should use it, even for short trips around the house.  Also, try not to rush)

## 2014-11-10 ENCOUNTER — Ambulatory Visit (INDEPENDENT_AMBULATORY_CARE_PROVIDER_SITE_OTHER): Payer: Medicare Other

## 2014-11-10 DIAGNOSIS — Z23 Encounter for immunization: Secondary | ICD-10-CM | POA: Diagnosis not present

## 2014-11-26 DIAGNOSIS — E119 Type 2 diabetes mellitus without complications: Secondary | ICD-10-CM | POA: Diagnosis not present

## 2014-12-01 DIAGNOSIS — L820 Inflamed seborrheic keratosis: Secondary | ICD-10-CM | POA: Diagnosis not present

## 2014-12-01 DIAGNOSIS — B353 Tinea pedis: Secondary | ICD-10-CM | POA: Diagnosis not present

## 2014-12-22 DIAGNOSIS — G4733 Obstructive sleep apnea (adult) (pediatric): Secondary | ICD-10-CM | POA: Diagnosis not present

## 2014-12-22 DIAGNOSIS — E119 Type 2 diabetes mellitus without complications: Secondary | ICD-10-CM | POA: Diagnosis not present

## 2015-01-04 ENCOUNTER — Other Ambulatory Visit: Payer: Self-pay | Admitting: Endocrinology

## 2015-02-23 DIAGNOSIS — L820 Inflamed seborrheic keratosis: Secondary | ICD-10-CM | POA: Diagnosis not present

## 2015-02-23 DIAGNOSIS — L219 Seborrheic dermatitis, unspecified: Secondary | ICD-10-CM | POA: Diagnosis not present

## 2015-03-09 ENCOUNTER — Ambulatory Visit: Payer: Medicare Other | Admitting: Endocrinology

## 2015-03-10 ENCOUNTER — Telehealth: Payer: Self-pay | Admitting: Endocrinology

## 2015-03-10 ENCOUNTER — Encounter: Payer: Self-pay | Admitting: Endocrinology

## 2015-03-10 ENCOUNTER — Other Ambulatory Visit: Payer: Self-pay

## 2015-03-10 ENCOUNTER — Ambulatory Visit (INDEPENDENT_AMBULATORY_CARE_PROVIDER_SITE_OTHER): Payer: Medicare Other | Admitting: Endocrinology

## 2015-03-10 VITALS — BP 136/94 | HR 83 | Temp 98.8°F | Ht 75.0 in | Wt 395.0 lb

## 2015-03-10 DIAGNOSIS — E1042 Type 1 diabetes mellitus with diabetic polyneuropathy: Secondary | ICD-10-CM | POA: Diagnosis not present

## 2015-03-10 LAB — POCT GLYCOSYLATED HEMOGLOBIN (HGB A1C): HEMOGLOBIN A1C: 9.1

## 2015-03-10 MED ORDER — SIMVASTATIN 40 MG PO TABS: 40.0000 mg | ORAL_TABLET | Freq: Every day | ORAL | Status: DC

## 2015-03-10 MED ORDER — INSULIN GLARGINE 100 UNIT/ML SOLOSTAR PEN: 150.0000 [IU] | PEN_INJECTOR | Freq: Every day | SUBCUTANEOUS | Status: DC

## 2015-03-10 MED ORDER — TAMSULOSIN HCL 0.4 MG PO CAPS: 0.4000 mg | ORAL_CAPSULE | Freq: Every day | ORAL | Status: DC

## 2015-03-10 MED ORDER — FUROSEMIDE 40 MG PO TABS: 40.0000 mg | ORAL_TABLET | Freq: Every day | ORAL | Status: DC

## 2015-03-10 MED ORDER — ALLOPURINOL 300 MG PO TABS: 300.0000 mg | ORAL_TABLET | Freq: Every day | ORAL | Status: DC

## 2015-03-10 MED ORDER — INSULIN ASPART 100 UNIT/ML FLEXPEN: PEN_INJECTOR | SUBCUTANEOUS | Status: DC

## 2015-03-10 NOTE — Progress Notes (Signed)
Subjective:    Patient ID: [name], male    DOB: 1940-06-14, 75 y.o.   MRN: [id number]  HPI Pt returns for f/u of diabetes mellitus:  DM type: insulin-requiring type 2. Dx'ed: 6834 Complications: CAD, nephropathy, and peripheral sensory neuropathy.   Therapy: insulin since 2004.   DKA: never.   Severe hypoglycemia: never.   Pancreatitis: never.   Other: he takes multiple daily injections. He declines weight-loss surgery.   Interval history: no cbg record, but states cbg's are highest in the afternoon, and lowest at lunch.  It is higher in am than at hs. He says he never misses the insulin.  He denies hypoglycemia.  He says ins prefers lantus and novolog Past Medical History  Diagnosis Date  . Arthritis   . Hyperlipidemia   . Multinodular goiter   . Sleep apnea   . NASH (nonalcoholic steatohepatitis)   . DM retinopathy ([place])   . ED (erectile dysfunction)   . Colitis 1997    nonspecific  . Anemia, unspecified   . Diabetes mellitus     Type I  . DM peripheral angiopathy ([place])   . Tubular adenoma of colon   . Osteoarthritis     Past Surgical History  Procedure Laterality Date  . Appendectomy    . Coronary artery bypass graft  2007    x 1  . Thoracotomy  2005    S/P Right Throactomy for Empyema  . Esophagogastroduodenoscopy  02/04/2003    normal  . Colonoscopy with propofol N/A 03/23/2014    Procedure: COLONOSCOPY WITH PROPOFOL;  Surgeon: Ladene Artist, MD;  Location: [facility];  Service: Endoscopy;  Laterality: N/A;  . Esophagogastroduodenoscopy (egd) with propofol N/A 03/23/2014    Procedure: ESOPHAGOGASTRODUODENOSCOPY (EGD) WITH PROPOFOL;  Surgeon: Ladene Artist, MD;  Location: [facility];  Service: Endoscopy;  Laterality: N/A;    Social History   Social History  . Marital Status: Married    Spouse Name: N/A  . Number of Children: N/A  . Years of Education: N/A   Occupational History  . Retired     laid off in 2010   Social History Main  Topics  . Smoking status: Former Smoker    Types: Cigarettes    Quit date: 03/21/1993  . Smokeless tobacco: Not on file  . Alcohol Use: Yes     Comment: rare  . Drug Use: No  . Sexual Activity: Not on file   Other Topics Concern  . Not on file   Social History Narrative   Regular exercise-swims once day   Daily Caffeine Use: 1-2 cups per day    Current Outpatient Prescriptions on File Prior to Visit  Medication Sig Dispense Refill  . aspirin (ASPIRIN ADULT LOW STRENGTH) 81 MG EC tablet Take 81 mg by mouth once.     . B-D UF III MINI PEN NEEDLES 31G X 5 MM MISC USE 1 PEN NEEDLE EVERY DAY AS NEEDED AS DIRECTED 540 each 1  . BD ULTRA-FINE PEN NEEDLES 29G X 12.7MM MISC USE 1 PEN NEEDLE AS DIRECTED TO INJECT AS NEEDED 270 each 2  . beta carotene w/minerals (OCUVITE) tablet Take 1 tablet by mouth daily.    . cholecalciferol (VITAMIN D) 1000 UNITS tablet Take 2,000 Units by mouth daily.    . clotrimazole-betamethasone (LOTRISONE) cream Apply topically 2 (two) times daily. 90 g 3  . Coenzyme Q10 (COQ10 PO) Take 1 tablet by mouth daily.    Marland Kitchen desonide (DESOWEN) 0.05 %  ointment Apply 1 application topically daily as needed (skin irrtation).     Marland Kitchen econazole nitrate 1 % cream Apply 1 application topically daily as needed (skin irritation).     . ferrous sulfate 325 (65 FE) MG tablet Take 325 mg by mouth daily with breakfast.    . fluocinonide (LIDEX) 0.05 % external solution Apply 1 application topically daily as needed (Skin irritation).     . Multiple Vitamin (MULTIVITAMIN) tablet Take 1 tablet by mouth daily.      . peg 3350 powder (MOVIPREP) 100 G SOLR Take 1 kit (200 g total) by mouth once. 1 kit 0  . Probiotic Product (PROBIOTIC DAILY PO) Take by mouth.     No current facility-administered medications on file prior to visit.    No Known Allergies  Family History  Problem Relation Age of Onset  . Crohn's disease Father   . Colon cancer Neg Hx     BP 136/94 mmHg  Pulse 83   Temp(Src) 98.8 F (37.1 C) (Oral)  Ht _0  (1.905 m)  Wt 395 lb (179.171 kg)  BMI 49.37 kg/m2  SpO2 92%    Review of Systems He denies hypoglycemia    Objective:   Physical Exam VITAL SIGNS: See vs page.  GENERAL: no distress. Pulses: dorsalis pedis intact bilat.   MSK: no deformity of the feet.  CV: 2+ bilat leg edema.  Skin: no ulcer on the feet. normal color and temp on the feet. Patchy hyperpigmentation at the anterior tibial areas.  Neuro: sensation is intact to touch on the feet, but decreased from normal.  Ext: There is bilateral onychomycosis of the toenails.   A1c=9.1%     Assessment & Plan:  DM: he needs increased rx  Patient is advised the following: Patient Instructions  Please change the NPH to lantus, 150 units at bedtime.   Please chang the humalog to novolog, 80-100-120 units.  check your blood sugar twice a day.  vary the time of day when you check, between before the 3 meals, and at bedtime.  also check if you have symptoms of your blood sugar being too high or too low.  please keep a record of the readings and bring it to your next appointment here (or you can bring the meter itself).  You can write it on any piece of paper.  please call us sooner if your blood sugar goes below 70, or if you have a lot of readings over 200. Please come back for a follow-up appointment in 2 months.

## 2015-03-10 NOTE — Telephone Encounter (Signed)
Patient ask you to call him on his home phone, its about a medication needing correction.

## 2015-03-10 NOTE — Patient Instructions (Addendum)
Please change the NPH to lantus, 150 units at bedtime.   Please chang the humalog to novolog, 80-100-120 units.  check your blood sugar twice a day.  vary the time of day when you check, between before the 3 meals, and at bedtime.  also check if you have symptoms of your blood sugar being too high or too low.  please keep a record of the readings and bring it to your next appointment here (or you can bring the meter itself).  You can write it on any piece of paper.  please call us sooner if your blood sugar goes below 70, or if you have a lot of readings over 200. Please come back for a follow-up appointment in 2 months.

## 2015-03-10 NOTE — Telephone Encounter (Signed)
I contacted the pt. Pt requested a 90 day supply of his Lantus to be sent to [facility]. rx submitted per pt's request.

## 2015-04-07 DIAGNOSIS — N390 Urinary tract infection, site not specified: Secondary | ICD-10-CM | POA: Diagnosis not present

## 2015-04-14 DIAGNOSIS — G4733 Obstructive sleep apnea (adult) (pediatric): Secondary | ICD-10-CM | POA: Diagnosis not present

## 2015-04-15 DIAGNOSIS — J984 Other disorders of lung: Secondary | ICD-10-CM | POA: Diagnosis not present

## 2015-04-15 DIAGNOSIS — G4733 Obstructive sleep apnea (adult) (pediatric): Secondary | ICD-10-CM | POA: Diagnosis not present

## 2015-04-15 DIAGNOSIS — Z6841 Body Mass Index (BMI) 40.0 and over, adult: Secondary | ICD-10-CM | POA: Diagnosis not present

## 2015-05-07 ENCOUNTER — Ambulatory Visit (INDEPENDENT_AMBULATORY_CARE_PROVIDER_SITE_OTHER): Payer: Medicare Other | Admitting: Endocrinology

## 2015-05-07 ENCOUNTER — Encounter: Payer: Self-pay | Admitting: Endocrinology

## 2015-05-07 VITALS — BP 124/70 | HR 99 | Temp 97.7°F | Resp 14 | Wt >= 6400 oz

## 2015-05-07 DIAGNOSIS — E119 Type 2 diabetes mellitus without complications: Secondary | ICD-10-CM

## 2015-05-07 DIAGNOSIS — E1042 Type 1 diabetes mellitus with diabetic polyneuropathy: Secondary | ICD-10-CM | POA: Diagnosis not present

## 2015-05-07 DIAGNOSIS — Z794 Long term (current) use of insulin: Secondary | ICD-10-CM

## 2015-05-07 DIAGNOSIS — E1159 Type 2 diabetes mellitus with other circulatory complications: Secondary | ICD-10-CM

## 2015-05-07 LAB — MICROALBUMIN / CREATININE URINE RATIO
CREATININE, U: 201.6 mg/dL
MICROALB UR: 12.2 mg/dL — AB (ref 0.0–1.9)
MICROALB/CREAT RATIO: 6.1 mg/g (ref 0.0–30.0)

## 2015-05-07 LAB — POCT GLYCOSYLATED HEMOGLOBIN (HGB A1C): Hemoglobin A1C: 8.3

## 2015-05-07 MED ORDER — SIMVASTATIN 40 MG PO TABS: 40.0000 mg | ORAL_TABLET | Freq: Every day | ORAL | Status: DC

## 2015-05-07 MED ORDER — ALLOPURINOL 300 MG PO TABS: 300.0000 mg | ORAL_TABLET | Freq: Every day | ORAL | Status: DC

## 2015-05-07 MED ORDER — INSULIN ASPART 100 UNIT/ML FLEXPEN: 70.0000 [IU] | PEN_INJECTOR | Freq: Three times a day (TID) | SUBCUTANEOUS | Status: DC

## 2015-05-07 MED ORDER — INSULIN GLARGINE 100 UNIT/ML SOLOSTAR PEN: 120.0000 [IU] | PEN_INJECTOR | Freq: Every day | SUBCUTANEOUS | Status: DC

## 2015-05-07 MED ORDER — TAMSULOSIN HCL 0.4 MG PO CAPS: 0.4000 mg | ORAL_CAPSULE | Freq: Every day | ORAL | Status: DC

## 2015-05-07 NOTE — Patient Instructions (Addendum)
Please continue the same insulin.   check your blood sugar twice a day.  vary the time of day when you check, between before the 3 meals, and at bedtime.  also check if you have symptoms of your blood sugar being too high or too low.  please keep a record of the readings and bring it to your next appointment here (or you can bring the meter itself).  You can write it on any piece of paper.  please call us sooner if your blood sugar goes below 70, or if you have a lot of readings over 200.  Please come back for a follow-up appointment in 3 months.     

## 2015-05-07 NOTE — Progress Notes (Signed)
Subjective:    Patient ID: [name], male    DOB: 1940-03-15, 75 y.o.   MRN: [id number]  HPI Pt returns for f/u of diabetes mellitus:  DM type: insulin-requiring type 2. Dx'ed: Q000111Q Complications: CAD, nephropathy, and peripheral sensory neuropathy.   Therapy: insulin since 2004.   DKA: never.   Severe hypoglycemia: never.   Pancreatitis: never.   Other: he takes multiple daily injections. He declines weight-loss surgery.   Interval history: he brings a record of his cbg's which i have reviewed today.  It varies from 96-292, but most are in the high-100's.  It is lowest fasting.  Since last ov, he had only 1 episode of hypoglycemia, and this was mild.  He says he never misses the insulin, but he takes lantus, only 120 qhs, and novolog 70 units tid.   Past Medical History  Diagnosis Date  . Arthritis   . Hyperlipidemia   . Multinodular goiter   . Sleep apnea   . NASH (nonalcoholic steatohepatitis)   . DM retinopathy ([place])   . ED (erectile dysfunction)   . Colitis 1997    nonspecific  . Anemia, unspecified   . Diabetes mellitus     Type I  . DM peripheral angiopathy ([place])   . Tubular adenoma of colon   . Osteoarthritis     Past Surgical History  Procedure Laterality Date  . Appendectomy    . Coronary artery bypass graft  2007    x 1  . Thoracotomy  2005    S/P Right Throactomy for Empyema  . Esophagogastroduodenoscopy  02/04/2003    normal  . Colonoscopy with propofol N/A 03/23/2014    Procedure: COLONOSCOPY WITH PROPOFOL;  Surgeon: Ladene Artist, MD;  [facility];  Service: Endoscopy;  Laterality: N/A;  . Esophagogastroduodenoscopy (egd) with propofol N/A 03/23/2014    Procedure: ESOPHAGOGASTRODUODENOSCOPY (EGD) WITH PROPOFOL;  Surgeon: Ladene Artist, MD;  [facility];  Service: Endoscopy;  Laterality: N/A;    Social History   Social History  . Marital Status: Married    Spouse Name: N/A  . Number of Children: N/A  . Years of  Education: N/A   Occupational History  . Retired     laid off in 2010   Social History Main Topics  . Smoking status: Former Smoker    Types: Cigarettes    Quit date: 03/21/1993  . Smokeless tobacco: Not on file  . Alcohol Use: Yes     Comment: rare  . Drug Use: No  . Sexual Activity: Not on file   Other Topics Concern  . Not on file   Social History Narrative   Regular exercise-swims once day   Daily Caffeine Use: 1-2 cups per day    Current Outpatient Prescriptions on File Prior to Visit  Medication Sig Dispense Refill  . aspirin (ASPIRIN ADULT LOW STRENGTH) 81 MG EC tablet Take 81 mg by mouth once.     . B-D UF III MINI PEN NEEDLES 31G X 5 MM MISC USE 1 PEN NEEDLE EVERY DAY AS NEEDED AS DIRECTED 540 each 1  . BD ULTRA-FINE PEN NEEDLES 29G X 12.7MM MISC USE 1 PEN NEEDLE AS DIRECTED TO INJECT AS NEEDED 270 each 2  . cholecalciferol (VITAMIN D) 1000 UNITS tablet Take 2,000 Units by mouth daily.    . clotrimazole-betamethasone (LOTRISONE) cream Apply topically 2 (two) times daily. 90 g 3  . Coenzyme Q10 (COQ10 PO) Take 1 tablet by mouth daily.    Marland Kitchen  desonide (DESOWEN) 0.05 % ointment Apply 1 application topically daily as needed (skin irrtation).     Marland Kitchen econazole nitrate 1 % cream Apply 1 application topically daily as needed (skin irritation).     . ferrous sulfate 325 (65 FE) MG tablet Take 325 mg by mouth every other day.     . fluocinonide (LIDEX) 0.05 % external solution Apply 1 application topically daily as needed (Skin irritation).     . furosemide (LASIX) 40 MG tablet Take 1 tablet (40 mg total) by mouth daily. 90 tablet 3  . Lorcaserin HCl (BELVIQ) 10 MG TABS Take by mouth 2 (two) times daily.    . Multiple Vitamin (MULTIVITAMIN) tablet Take 1 tablet by mouth daily.      . Probiotic Product (PROBIOTIC DAILY PO) Take by mouth.     No current facility-administered medications on file prior to visit.    No Known Allergies  Family History  Problem Relation Age of  Onset  . Crohn's disease Father   . Colon cancer Neg Hx     BP 124/70 mmHg  Pulse 99  Temp(Src) 97.7 F (36.5 C) (Oral)  Resp 14  Wt 403 lb 12.8 oz (183.162 kg)  SpO2 94%  Review of Systems He denies LOC.      Objective:   Physical Exam VITAL SIGNS:  See vs page GENERAL: no distress SKIN:  Insulin injection sites at the anterior abdomen are normal, except for a few ecchymoses.     A1c=8.3%    Assessment & Plan:  DM: he needs increased rx. Noncompliance with insulin.  he declines to increase insulin.  We discussed risks.  Patient is advised the following: Patient Instructions  Please continue the same insulin check your blood sugar twice a day.  vary the time of day when you check, between before the 3 meals, and at bedtime.  also check if you have symptoms of your blood sugar being too high or too low.  please keep a record of the readings and bring it to your next appointment here (or you can bring the meter itself).  You can write it on any piece of paper.  please call us sooner if your blood sugar goes below 70, or if you have a lot of readings over 200. Please come back for a follow-up appointment in 3 months.

## 2015-06-01 DIAGNOSIS — L219 Seborrheic dermatitis, unspecified: Secondary | ICD-10-CM | POA: Diagnosis not present

## 2015-06-01 DIAGNOSIS — L820 Inflamed seborrheic keratosis: Secondary | ICD-10-CM | POA: Diagnosis not present

## 2015-07-23 ENCOUNTER — Telehealth: Payer: Self-pay | Admitting: Endocrinology

## 2015-07-23 NOTE — Telephone Encounter (Signed)
Can we try to see if the pt could have a rx for the remote sensor he heard that medicare is now covering the sensor

## 2015-07-26 ENCOUNTER — Telehealth: Payer: Self-pay | Admitting: Endocrinology

## 2015-07-26 DIAGNOSIS — E1042 Type 1 diabetes mellitus with diabetic polyneuropathy: Secondary | ICD-10-CM

## 2015-07-26 NOTE — Telephone Encounter (Signed)
Pt calling again about the sensor rx  Can we review the reasoning for the A1C being done before 90 days, medicare is not covering prior to.

## 2015-07-26 NOTE — Telephone Encounter (Signed)
We can do a1c when you are back here on 08/09/15. Which brand of sensor are you requesting?

## 2015-07-26 NOTE — Telephone Encounter (Signed)
I contacted the pt. He stated the sensor is called [employer] G5 mobile. Pt stated medicare is paying for the sensor if the pt meets the criteria for checking blood sugar 4 times per day.  Pt also stated his recent POCT a1c test was not covered by medicare because it was done in two month period instead of 90 days. Pt wanted to know why this was requested if it was outside of the medicare guidelines. Please advise, Thanks!

## 2015-07-27 NOTE — Addendum Note (Signed)
Addended by: Renato Shin on: 07/27/2015 03:07 PM   Modules accepted: Orders

## 2015-07-27 NOTE — Telephone Encounter (Signed)
Please see Vaughan Basta about the [place].  you will receive a phone call, about a day and time for an appointment. We can do the a1c sooner if it has been high.  Never heard of a problem with medicare with doing it sooner.

## 2015-07-30 NOTE — Telephone Encounter (Signed)
I contacted the pt and left a vm advising of note below.

## 2015-08-09 ENCOUNTER — Ambulatory Visit: Payer: Medicare Other | Admitting: Endocrinology

## 2015-08-23 ENCOUNTER — Ambulatory Visit (INDEPENDENT_AMBULATORY_CARE_PROVIDER_SITE_OTHER): Payer: Medicare Other | Admitting: Endocrinology

## 2015-08-23 ENCOUNTER — Telehealth: Payer: Self-pay | Admitting: Endocrinology

## 2015-08-23 ENCOUNTER — Encounter: Payer: Self-pay | Admitting: Endocrinology

## 2015-08-23 ENCOUNTER — Encounter: Payer: Medicare Other | Attending: Endocrinology | Admitting: Nutrition

## 2015-08-23 VITALS — BP 146/79 | HR 83 | Temp 97.3°F | Ht 75.0 in | Wt 396.6 lb

## 2015-08-23 DIAGNOSIS — E1042 Type 1 diabetes mellitus with diabetic polyneuropathy: Secondary | ICD-10-CM | POA: Diagnosis not present

## 2015-08-23 DIAGNOSIS — E1159 Type 2 diabetes mellitus with other circulatory complications: Secondary | ICD-10-CM | POA: Diagnosis not present

## 2015-08-23 DIAGNOSIS — Z794 Long term (current) use of insulin: Secondary | ICD-10-CM | POA: Diagnosis not present

## 2015-08-23 DIAGNOSIS — Z713 Dietary counseling and surveillance: Secondary | ICD-10-CM | POA: Insufficient documentation

## 2015-08-23 MED ORDER — INSULIN ASPART 100 UNIT/ML FLEXPEN: 75.0000 [IU] | PEN_INJECTOR | Freq: Three times a day (TID) | SUBCUTANEOUS | 11 refills | Status: DC

## 2015-08-23 MED ORDER — INSULIN GLARGINE 100 UNIT/ML SOLOSTAR PEN: 120.0000 [IU] | PEN_INJECTOR | Freq: Every day | SUBCUTANEOUS | 2 refills | Status: DC

## 2015-08-23 NOTE — Progress Notes (Signed)
Subjective:    Patient ID: [name], male    DOB: 08/31/1940, 75 y.o.   MRN: [id number]  HPI Pt returns for f/u of diabetes mellitus:  DM type: insulin-requiring type 2. Dx'ed: Q000111Q Complications: CAD, nephropathy, and peripheral sensory neuropathy.    Therapy: insulin since 2004.   DKA: never.   Severe hypoglycemia: never.   Pancreatitis: never.   Other: he takes multiple daily injections. He declines weight-loss surgery.   Interval history: he brings a record of his cbg's which i have reviewed today.  It varies from 96-292, but most are in the high-100's.  It is lowest fasting.  Since last ov, he had only 1 episode of hypoglycemia, and this was mild.  He says he never misses the insulin, but he takes lantus, 120 qhs, and novolog 70 units tid.  no cbg record, but states cbg's are in the mid-100's.  There is no trend throughout the day.   Past Medical History:  Diagnosis Date  . Anemia, unspecified   . Arthritis   . Colitis 1997   nonspecific  . Diabetes mellitus    Type I  . DM peripheral angiopathy ([place])   . DM retinopathy ([place])   . ED (erectile dysfunction)   . Hyperlipidemia   . Multinodular goiter   . NASH (nonalcoholic steatohepatitis)   . Osteoarthritis   . Sleep apnea   . Tubular adenoma of colon     Past Surgical History:  Procedure Laterality Date  . APPENDECTOMY    . COLONOSCOPY WITH PROPOFOL N/A 03/23/2014   Procedure: COLONOSCOPY WITH PROPOFOL;  Surgeon: Ladene Artist, MD;  [facility];  Service: Endoscopy;  Laterality: N/A;  . CORONARY ARTERY BYPASS GRAFT  2007   x 1  . ESOPHAGOGASTRODUODENOSCOPY  02/04/2003   normal  . ESOPHAGOGASTRODUODENOSCOPY (EGD) WITH PROPOFOL N/A 03/23/2014   Procedure: ESOPHAGOGASTRODUODENOSCOPY (EGD) WITH PROPOFOL;  Surgeon: Ladene Artist, MD;  [facility];  Service: Endoscopy;  Laterality: N/A;  . THORACOTOMY  2005   S/P Right Throactomy for Empyema    Social History   Social History  .  Marital status: Married    Spouse name: N/A  . Number of children: N/A  . Years of education: N/A   Occupational History  . Retired     laid off in 2010   Social History Main Topics  . Smoking status: Former Smoker    Types: Cigarettes    Quit date: 03/21/1993  . Smokeless tobacco: Not on file  . Alcohol use Yes     Comment: rare  . Drug use: No  . Sexual activity: Not on file   Other Topics Concern  . Not on file   Social History Narrative   Regular exercise-swims once day   Daily Caffeine Use: 1-2 cups per day    Current Outpatient Prescriptions on File Prior to Visit  Medication Sig Dispense Refill  . allopurinol (ZYLOPRIM) 300 MG tablet Take 1 tablet (300 mg total) by mouth daily. 90 tablet 3  . aspirin (ASPIRIN ADULT LOW STRENGTH) 81 MG EC tablet Take 81 mg by mouth once.     . B-D UF III MINI PEN NEEDLES 31G X 5 MM MISC USE 1 PEN NEEDLE EVERY DAY AS NEEDED AS DIRECTED 540 each 1  . BD ULTRA-FINE PEN NEEDLES 29G X 12.7MM MISC USE 1 PEN NEEDLE AS DIRECTED TO INJECT AS NEEDED 270 each 2  . cholecalciferol (VITAMIN D) 1000 UNITS tablet Take 2,000 Units by  mouth daily.    . clotrimazole-betamethasone (LOTRISONE) cream Apply topically 2 (two) times daily. 90 g 3  . Coenzyme Q10 (COQ10 PO) Take 1 tablet by mouth daily.    Marland Kitchen desonide (DESOWEN) 0.05 % ointment Apply 1 application topically daily as needed (skin irrtation).     Marland Kitchen econazole nitrate 1 % cream Apply 1 application topically daily as needed (skin irritation).     . ferrous sulfate 325 (65 FE) MG tablet Take 325 mg by mouth every other day.     . fluocinonide (LIDEX) 0.05 % external solution Apply 1 application topically daily as needed (Skin irritation).     . furosemide (LASIX) 40 MG tablet Take 1 tablet (40 mg total) by mouth daily. 90 tablet 3  . Lorcaserin HCl (BELVIQ) 10 MG TABS Take by mouth 2 (two) times daily.    . Multiple Vitamin (MULTIVITAMIN) tablet Take 1 tablet by mouth daily.      . Probiotic Product  (PROBIOTIC DAILY PO) Take by mouth.    . simvastatin (ZOCOR) 40 MG tablet Take 1 tablet (40 mg total) by mouth daily. 90 tablet 3  . tamsulosin (FLOMAX) 0.4 MG CAPS capsule Take 1 capsule (0.4 mg total) by mouth daily. 90 capsule 3   No current facility-administered medications on file prior to visit.     No Known Allergies  Family History  Problem Relation Age of Onset  . Crohn's disease Father   . Colon cancer Neg Hx     BP (!) 146/79   Pulse 83   Temp 97.3 F (36.3 C) (Oral)   Ht 6\' 3"  (1.905 m)   Wt (!) 396 lb 9.6 oz (179.9 kg)   SpO2 95%   BMI 49.57 kg/m   Review of Systems He denies hypoglycemia    Objective:   Physical Exam VITAL SIGNS: See vs page.  GENERAL: no distress. Pulses: dorsalis pedis intact bilat.   MSK: no deformity of the feet.  CV: 2+ bilat leg edema.  Skin: no ulcer on the feet. normal color and temp on the feet. Patchy hyperpigmentation at the anterior tibial areas.  Neuro: sensation is intact to touch on the feet, but decreased from normal.  Ext: There is bilateral onychomycosis of the toenails.    A1c=7.4%    Assessment & Plan:  Insulin-requiring type 2 DM: he needs increased rx (pt says he'll do fasting labs at next ov).   Patient is advised the following: Patient Instructions  Please come in fasting, to do blood tests for the pump. Please increase the novolog to 75 units 3 times a day (just before each meal) check your blood sugar twice a day.  vary the time of day when you check, between before the 3 meals, and at bedtime.  also check if you have symptoms of your blood sugar being too high or too low.  please keep a record of the readings and bring it to your next appointment here (or you can bring the meter itself).  You can write it on any piece of paper.  please call us sooner if your blood sugar goes below 70, or if you have a lot of readings over 200. Please come back for a follow-up appointment in 3 months.    Renato Shin, MD

## 2015-08-23 NOTE — Patient Instructions (Signed)
Call when you get the Dexcom if you need assistance in starting this.  [phone]

## 2015-08-23 NOTE — Patient Instructions (Addendum)
Please come in fasting, to do blood tests for the pump. Please increase the novolog to 75 units 3 times a day (just before each meal) check your blood sugar twice a day.  vary the time of day when you check, between before the 3 meals, and at bedtime.  also check if you have symptoms of your blood sugar being too high or too low.  please keep a record of the readings and bring it to your next appointment here (or you can bring the meter itself).  You can write it on any piece of paper.  please call us sooner if your blood sugar goes below 70, or if you have a lot of readings over 200. Please come back for a follow-up appointment in 3 months.

## 2015-08-23 NOTE — Assessment & Plan Note (Signed)
Patient has information that his Medicare insurance with pay for his CGM Dexcom.  He was shown the Dexcom and we discussed what it can do for him.  He filled the [employer] and signed the form.  I faxed it in for him.  He was told to call me when he gets it, if he needs help in starting this.  He agreed to do this.  He had no final questionsl

## 2015-08-23 NOTE — Telephone Encounter (Signed)
[name] w/[employer] claims handler in [place] would like to verify that the patient is using the Novalog, 10 pens a day.

## 2015-08-25 NOTE — Telephone Encounter (Signed)
Called patient to clarify Novolog he receives daily,,states he uses Novolog Flex Pen 75Units 3 x daily. Information given to pharmacy.

## 2015-08-26 DIAGNOSIS — D224 Melanocytic nevi of scalp and neck: Secondary | ICD-10-CM | POA: Diagnosis not present

## 2015-08-26 DIAGNOSIS — L820 Inflamed seborrheic keratosis: Secondary | ICD-10-CM | POA: Diagnosis not present

## 2015-09-03 ENCOUNTER — Other Ambulatory Visit: Payer: Self-pay

## 2015-09-03 MED ORDER — INSULIN PEN NEEDLE 31G X 8 MM MISC: 2 refills | Status: DC

## 2015-09-14 DIAGNOSIS — L820 Inflamed seborrheic keratosis: Secondary | ICD-10-CM | POA: Diagnosis not present

## 2015-09-14 DIAGNOSIS — L219 Seborrheic dermatitis, unspecified: Secondary | ICD-10-CM | POA: Diagnosis not present

## 2015-09-14 DIAGNOSIS — L570 Actinic keratosis: Secondary | ICD-10-CM | POA: Diagnosis not present

## 2015-10-21 ENCOUNTER — Other Ambulatory Visit: Payer: Self-pay | Admitting: Endocrinology

## 2015-11-23 ENCOUNTER — Encounter: Payer: Self-pay | Admitting: Endocrinology

## 2015-11-23 ENCOUNTER — Ambulatory Visit (INDEPENDENT_AMBULATORY_CARE_PROVIDER_SITE_OTHER): Payer: Medicare Other | Admitting: Endocrinology

## 2015-11-23 VITALS — BP 124/64 | HR 76 | Ht 75.0 in | Wt 388.0 lb

## 2015-11-23 DIAGNOSIS — Z0000 Encounter for general adult medical examination without abnormal findings: Secondary | ICD-10-CM | POA: Diagnosis not present

## 2015-11-23 DIAGNOSIS — K7581 Nonalcoholic steatohepatitis (NASH): Secondary | ICD-10-CM

## 2015-11-23 DIAGNOSIS — M109 Gout, unspecified: Secondary | ICD-10-CM

## 2015-11-23 DIAGNOSIS — Z125 Encounter for screening for malignant neoplasm of prostate: Secondary | ICD-10-CM | POA: Diagnosis not present

## 2015-11-23 DIAGNOSIS — E1042 Type 1 diabetes mellitus with diabetic polyneuropathy: Secondary | ICD-10-CM | POA: Diagnosis not present

## 2015-11-23 DIAGNOSIS — I509 Heart failure, unspecified: Secondary | ICD-10-CM

## 2015-11-23 DIAGNOSIS — Z23 Encounter for immunization: Secondary | ICD-10-CM | POA: Diagnosis not present

## 2015-11-23 LAB — LIPID PANEL
Cholesterol: 135 mg/dL (ref 0–200)
HDL: 40.8 mg/dL (ref >39.00)
NONHDL: 94.26
Total CHOL/HDL Ratio: 3
Triglycerides: 203 mg/dL — ABNORMAL HIGH (ref 0.0–149.0)
VLDL: 40.6 mg/dL — ABNORMAL HIGH (ref 0.0–40.0)

## 2015-11-23 LAB — BASIC METABOLIC PANEL
BUN: 22 mg/dL (ref 6–23)
CALCIUM: 9.3 mg/dL (ref 8.4–10.5)
CO2: 31 mEq/L (ref 19–32)
Chloride: 103 mEq/L (ref 96–112)
Creatinine, Ser: 1.21 mg/dL (ref 0.40–1.50)
GFR: 62.29 mL/min (ref >60.00)
Glucose, Bld: 191 mg/dL — ABNORMAL HIGH (ref 70–99)
POTASSIUM: 4.3 meq/L (ref 3.5–5.1)
SODIUM: 141 meq/L (ref 135–145)

## 2015-11-23 LAB — CBC WITH DIFFERENTIAL/PLATELET
BASOS ABS: 0.1 10*3/uL (ref 0.0–0.1)
Basophils Relative: 0.7 % (ref 0.0–3.0)
Eosinophils Absolute: 0.4 10*3/uL (ref 0.0–0.7)
Eosinophils Relative: 6 % — ABNORMAL HIGH (ref 0.0–5.0)
HEMATOCRIT: 42.4 % (ref 39.0–52.0)
HEMOGLOBIN: 13.9 g/dL (ref 13.0–17.0)
LYMPHS PCT: 22.9 % (ref 12.0–46.0)
Lymphs Abs: 1.7 10*3/uL (ref 0.7–4.0)
MCHC: 32.9 g/dL (ref 30.0–36.0)
MCV: 91.3 fl (ref 78.0–100.0)
MONOS PCT: 12.3 % — AB (ref 3.0–12.0)
Monocytes Absolute: 0.9 10*3/uL (ref 0.1–1.0)
NEUTROS ABS: 4.3 10*3/uL (ref 1.4–7.7)
Neutrophils Relative %: 58.1 % (ref 43.0–77.0)
PLATELETS: 261 10*3/uL (ref 150.0–400.0)
RBC: 4.64 Mil/uL (ref 4.22–5.81)
RDW: 13.7 % (ref 11.5–15.5)
WBC: 7.3 10*3/uL (ref 4.0–10.5)

## 2015-11-23 LAB — HEPATIC FUNCTION PANEL
ALBUMIN: 3.8 g/dL (ref 3.5–5.2)
ALK PHOS: 58 U/L (ref 39–117)
ALT: 31 U/L (ref 0–53)
AST: 27 U/L (ref 0–37)
Bilirubin, Direct: 0.1 mg/dL (ref 0.0–0.3)
Total Bilirubin: 0.5 mg/dL (ref 0.2–1.2)
Total Protein: 6.5 g/dL (ref 6.0–8.3)

## 2015-11-23 LAB — URINALYSIS, ROUTINE W REFLEX MICROSCOPIC
BILIRUBIN URINE: NEGATIVE
HGB URINE DIPSTICK: NEGATIVE
KETONES UR: NEGATIVE
LEUKOCYTES UA: NEGATIVE
NITRITE: NEGATIVE
PH: 6 (ref 5.0–8.0)
RBC / HPF: NONE SEEN (ref 0–?)
Specific Gravity, Urine: 1.02 (ref 1.000–1.030)
URINE GLUCOSE: NEGATIVE
Urobilinogen, UA: 0.2 (ref 0.0–1.0)

## 2015-11-23 LAB — PSA, MEDICARE: PSA: 0.49 ng/ml (ref 0.10–4.00)

## 2015-11-23 LAB — MICROALBUMIN / CREATININE URINE RATIO
CREATININE, U: 267 mg/dL
MICROALB UR: 11.9 mg/dL — AB (ref 0.0–1.9)
Microalb Creat Ratio: 4.5 mg/g (ref 0.0–30.0)

## 2015-11-23 LAB — TSH: TSH: 2.55 u[IU]/mL (ref 0.35–4.50)

## 2015-11-23 LAB — BRAIN NATRIURETIC PEPTIDE: PRO B NATRI PEPTIDE: 21 pg/mL (ref 0.0–100.0)

## 2015-11-23 LAB — URIC ACID: Uric Acid, Serum: 6.9 mg/dL (ref 4.0–7.8)

## 2015-11-23 LAB — POCT GLYCOSYLATED HEMOGLOBIN (HGB A1C): HEMOGLOBIN A1C: 7.4

## 2015-11-23 LAB — VITAMIN D 25 HYDROXY (VIT D DEFICIENCY, FRACTURES): VITD: 25.68 ng/mL — AB (ref 30.00–100.00)

## 2015-11-23 LAB — LDL CHOLESTEROL, DIRECT: Direct LDL: 71 mg/dL

## 2015-11-23 MED ORDER — INSULIN ASPART 100 UNIT/ML FLEXPEN: 70.0000 [IU] | PEN_INJECTOR | Freq: Three times a day (TID) | SUBCUTANEOUS | 11 refills | Status: DC

## 2015-11-23 NOTE — Progress Notes (Signed)
we discussed code status.  pt requests full code, but would not want to be started or maintained on artificial life-support measures if there was not a reasonable chance of recovery 

## 2015-11-23 NOTE — Patient Instructions (Addendum)
check your blood sugar twice a day.  vary the time of day when you check, between before the 3 meals, and at bedtime.  also check if you have symptoms of your blood sugar being too high or too low.  please keep a record of the readings and bring it to your next appointment here.  You can write it on any piece of paper.  please call us sooner if your blood sugar goes below 70, or if you have a lot of readings over 200.   Please continue the same insulins. good diet and exercise significantly improve the control of your diabetes.  please let me know if you wish to be referred to a dietician.  high blood sugar is very risky to your health.  you should see an eye doctor and dentist every year.  It is very important to get all recommended vaccinations. Please come back for a follow-up appointment in 3-4 months. please consider these measures for your health:  minimize alcohol.  do not use tobacco products.  have a colonoscopy at least every 10 years from age 77.  keep firearms safely stored.  always use seat belts.  have working smoke alarms in your home.  see an eye doctor and dentist regularly.  never drive under the influence of alcohol or drugs (including prescription drugs).  those with fair skin should take precautions against the sun. it is critically important to prevent falling down (keep floor areas well-lit, dry, and free of loose objects.  If you have a cane, walker, or wheelchair, you should use it, even for short trips around the house.  Also, try not to rush) Please let us know if you want to have the right foot x-rayed, or if you want to see a specialist for the foot pain.

## 2015-11-23 NOTE — Progress Notes (Signed)
Subjective:    Patient ID: [name], male    DOB: Nov 24, 1940, 75 y.o.   MRN: [id number]  HPI Pt returns for f/u of diabetes mellitus:  DM type: insulin-requiring type 2. Dx'ed: Q000111Q Complications: CAD, nephropathy, and peripheral sensory neuropathy.    Therapy: insulin since 2004.   DKA: never.   Severe hypoglycemia: never.   Pancreatitis: never.   Other: he takes multiple daily injections. He declines weight-loss surgery.   Interval history: he takes lantus, 120 qhs, and novolog 70 units tid.  He seldom has hypoglycemia (60's).  This usually happens in the afternoon, after he misses lunch (and lunch novolog), or in the middle of the night.  He says he is eating better recently  He has few mos of slight darkening of the skin at 2 spots of the right foot, but no assoc ulcer.   Past Medical History:  Diagnosis Date  . Anemia, unspecified   . Arthritis   . Colitis 1997   nonspecific  . Diabetes mellitus    Type I  . DM peripheral angiopathy ([place])   . DM retinopathy ([place])   . ED (erectile dysfunction)   . Hyperlipidemia   . Multinodular goiter   . NASH (nonalcoholic steatohepatitis)   . Osteoarthritis   . Sleep apnea   . Tubular adenoma of colon     Past Surgical History:  Procedure Laterality Date  . APPENDECTOMY    . COLONOSCOPY WITH PROPOFOL N/A 03/23/2014   Procedure: COLONOSCOPY WITH PROPOFOL;  Surgeon: Ladene Artist, MD;  [facility];  Service: Endoscopy;  Laterality: N/A;  . CORONARY ARTERY BYPASS GRAFT  2007   x 1  . ESOPHAGOGASTRODUODENOSCOPY  02/04/2003   normal  . ESOPHAGOGASTRODUODENOSCOPY (EGD) WITH PROPOFOL N/A 03/23/2014   Procedure: ESOPHAGOGASTRODUODENOSCOPY (EGD) WITH PROPOFOL;  Surgeon: Ladene Artist, MD;  [facility];  Service: Endoscopy;  Laterality: N/A;  . THORACOTOMY  2005   S/P Right Throactomy for Empyema    Social History   Social History  . Marital status: Married    Spouse name: N/A  . Number of children:  N/A  . Years of education: N/A   Occupational History  . Retired     laid off in 2010   Social History Main Topics  . Smoking status: Former Smoker    Types: Cigarettes    Quit date: 03/21/1993  . Smokeless tobacco: Not on file  . Alcohol use Yes     Comment: rare  . Drug use: No  . Sexual activity: Not on file   Other Topics Concern  . Not on file   Social History Narrative   Regular exercise-swims once day   Daily Caffeine Use: 1-2 cups per day    Current Outpatient Prescriptions on File Prior to Visit  Medication Sig Dispense Refill  . allopurinol (ZYLOPRIM) 300 MG tablet Take 1 tablet (300 mg total) by mouth daily. 90 tablet 3  . aspirin (ASPIRIN ADULT LOW STRENGTH) 81 MG EC tablet Take 81 mg by mouth once.     . B-D UF III MINI PEN NEEDLES 31G X 5 MM MISC USE 1 PEN NEEDLE EVERY DAY AS NEEDED AS DIRECTED 540 each 1  . BD ULTRA-FINE PEN NEEDLES 29G X 12.7MM MISC USE 1 PEN NEEDLE AS DIRECTED TO INJECT AS NEEDED 270 each 2  . cholecalciferol (VITAMIN D) 1000 UNITS tablet Take 2,000 Units by mouth daily.    . clotrimazole-betamethasone (LOTRISONE) cream Apply topically 2 (two) times  daily. 90 g 3  . Coenzyme Q10 (COQ10 PO) Take 1 tablet by mouth daily.    Marland Kitchen desonide (DESOWEN) 0.05 % ointment Apply 1 application topically daily as needed (skin irrtation).     Marland Kitchen econazole nitrate 1 % cream Apply 1 application topically daily as needed (skin irritation).     . ferrous sulfate 325 (65 FE) MG tablet Take 325 mg by mouth every other day.     . fluocinonide (LIDEX) 0.05 % external solution Apply 1 application topically daily as needed (Skin irritation).     . furosemide (LASIX) 40 MG tablet Take 1 tablet (40 mg total) by mouth daily. 90 tablet 3  . Insulin Pen Needle 31G X 8 MM MISC Use to inject insulin 4 time per day 150 each 2  . LANTUS SOLOSTAR 100 UNIT/ML Solostar Pen INJECT 120 UNITS INTO THE SKIN AT BEDTIME 45 mL 1  . Lorcaserin HCl (BELVIQ) 10 MG TABS Take by mouth 2 (two)  times daily.    . Multiple Vitamin (MULTIVITAMIN) tablet Take 1 tablet by mouth daily.      . Probiotic Product (PROBIOTIC DAILY PO) Take by mouth.    . simvastatin (ZOCOR) 40 MG tablet Take 1 tablet (40 mg total) by mouth daily. 90 tablet 3  . tamsulosin (FLOMAX) 0.4 MG CAPS capsule Take 1 capsule (0.4 mg total) by mouth daily. 90 capsule 3   No current facility-administered medications on file prior to visit.     No Known Allergies  Family History  Problem Relation Age of Onset  . Crohn's disease Father   . Colon cancer Neg Hx     BP 124/64   Pulse 76   Ht 6\' 3"  (1.905 m)   Wt (!) 388 lb (176 kg)   SpO2 96%   BMI 48.50 kg/m   Review of Systems Denies LOC.  Separately from the above, he has right foot pain.      Objective:   Physical Exam VITAL SIGNS: See vs page.  GENERAL: no distress. Pulses: dorsalis pedis intact bilat.   MSK: no deformity of the feet.  CV: 2+ bilat leg edema.  Skin: no ulcer on the feet. normal color and temp on the feet. Patchy hyperpigmentation at the anterior tibial areas. there are 2 calluses at the right foot, both with ecchymoses, but no break in the skin.   Neuro: sensation is intact to touch on the feet, but decreased from normal.  Ext: There is bilateral onychomycosis of the toenails.   ecg machine is broken  Lab Results  Component Value Date   HGBA1C 7.4 11/23/2015      Assessment & Plan:  Insulin-requiring type 2 DM, with nephropathy.  this is the best control this pt should aim for, given variable cbg's and advanced age. Foot pain, new, uncertain etiology. Ecchymoses under 2 foot calluses.  I told pt this is harmless, unless the skin breaks.     Subjective:   Patient here for Medicare annual wellness visit and management of other chronic and acute problems.     Risk factors: advanced age    15 of Physicians Providing Medical Care to Patient:  See "snapshot"   Activities of Daily Living: In your present state  of health, do you have any difficulty performing the following activities?:  Preparing food and eating?: No  Bathing yourself: No  Getting dressed: No  Using the toilet:No  Moving around from place to place: No  In the past year have you  fallen or had a near fall?: No    Home Safety: Has smoke detector and wears seat belts. No firearms. No excess sun exposure.   Diet and Exercise  Current exercise habits: limited by health problems.  Dietary issues discussed: pt says diet is impoved  Depression Screen  Q1: Over the past two weeks, have you felt down, depressed or hopeless?no  Q2: Over the past two weeks, have you felt little interest or pleasure in doing things? no   The following portions of the patient's history were reviewed and updated as appropriate: allergies, current medications, past family history, past medical history, past social history, past surgical history and problem list.   Review of Systems  Denies hearing loss, and visual loss Objective:   Vision:  Advertising account executive, but does not recall name.  He declines [facility] today.   Hearing: grossly normal.  Body mass index:  See vs page.   Msk: pt easily and quickly performs "get-up-and-go" from a sitting position.   Cognitive Impairment Assessment: cognition, memory and judgment appear normal.  remembers 3/3 at 5 minutes.  excellent recall.  can easily read and write a sentence.  alert and oriented x 3.    Assessment:   Medicare wellness utd on preventive parameters    Plan:   During the course of the visit the patient was educated and counseled about appropriate screening and preventive services including:       Fall prevention   Diabetes screening  Nutrition counseling   Vaccines / LABS Zostavax / Pneumococcal Vaccine  today  PSA  Patient Instructions (the written plan) was given to the patient.

## 2015-11-25 LAB — PTH, INTACT AND CALCIUM
Calcium: 8.8 mg/dL (ref 8.6–10.3)
PTH: 70 pg/mL — AB (ref 14–64)

## 2015-12-09 ENCOUNTER — Telehealth: Payer: Self-pay | Admitting: Endocrinology

## 2015-12-09 NOTE — Telephone Encounter (Signed)
[facility] is best rx.  I would be happy to see in the office.

## 2015-12-09 NOTE — Telephone Encounter (Signed)
I contacted the patient and advised of message. Patient declined a visit at this time.

## 2015-12-09 NOTE — Telephone Encounter (Signed)
See message and please advise, Thanks!  

## 2015-12-09 NOTE — Telephone Encounter (Signed)
Patient is having a flare up with his gout his ankles are swollen and are very painful. He also has a blood blister on hi big toe twice the size of the other one that fell off on it own.  He has question on how to take his medication (lasix) and ibuprofen .Marland Kitchen Please advise

## 2015-12-13 ENCOUNTER — Ambulatory Visit (INDEPENDENT_AMBULATORY_CARE_PROVIDER_SITE_OTHER): Payer: Medicare Other | Admitting: Endocrinology

## 2015-12-13 ENCOUNTER — Encounter: Payer: Self-pay | Admitting: Endocrinology

## 2015-12-13 VITALS — BP 128/64 | HR 76 | Ht 75.0 in | Wt >= 6400 oz

## 2015-12-13 DIAGNOSIS — L97519 Non-pressure chronic ulcer of other part of right foot with unspecified severity: Secondary | ICD-10-CM | POA: Insufficient documentation

## 2015-12-13 DIAGNOSIS — L97511 Non-pressure chronic ulcer of other part of right foot limited to breakdown of skin: Secondary | ICD-10-CM | POA: Diagnosis not present

## 2015-12-13 DIAGNOSIS — Z23 Encounter for immunization: Secondary | ICD-10-CM

## 2015-12-13 NOTE — Telephone Encounter (Signed)
Pt has now had the blister he was speaking of on Thursday rupture and is causing him issues.  Wants to be worked in today please advise

## 2015-12-13 NOTE — Telephone Encounter (Signed)
See message and please advise, Thanks!  

## 2015-12-13 NOTE — Patient Instructions (Signed)
Please see a wound care specialist.  you will receive a phone call, about a day and time for an appointment.   until the appointment, keep the ulcer covered with antibiotic ointment and a large bandaid.   Here is a prescription, for a boot.

## 2015-12-13 NOTE — Telephone Encounter (Signed)
I contacted the patient and advised of message. Patient agreed to the 245 pm appointment time.

## 2015-12-13 NOTE — Progress Notes (Signed)
Subjective:    Patient ID: [name], male    DOB: 1940/08/17, 75 y.o.   MRN: [id number]  HPI Pt states 1 week of moderate pain at the right great toe, and assoc ulcer.   Past Medical History:  Diagnosis Date  . Anemia, unspecified   . Arthritis   . Colitis 1997   nonspecific  . Diabetes mellitus    Type I  . DM peripheral angiopathy ([place])   . DM retinopathy ([place])   . ED (erectile dysfunction)   . Hyperlipidemia   . Multinodular goiter   . NASH (nonalcoholic steatohepatitis)   . Osteoarthritis   . Sleep apnea   . Tubular adenoma of colon     Past Surgical History:  Procedure Laterality Date  . APPENDECTOMY    . COLONOSCOPY WITH PROPOFOL N/A 03/23/2014   Procedure: COLONOSCOPY WITH PROPOFOL;  Surgeon: Ladene Artist, MD;  Location[facility];  Service: Endoscopy;  Laterality: N/A;  . CORONARY ARTERY BYPASS GRAFT  2007   x 1  . ESOPHAGOGASTRODUODENOSCOPY  02/04/2003   normal  . ESOPHAGOGASTRODUODENOSCOPY (EGD) WITH PROPOFOL N/A 03/23/2014   Procedure: ESOPHAGOGASTRODUODENOSCOPY (EGD) WITH PROPOFOL;  Surgeon: Ladene Artist, MD;  [facility];  Service: Endoscopy;  Laterality: N/A;  . THORACOTOMY  2005   S/P Right Throactomy for Empyema    Social History   Social History  . Marital status: Married    Spouse name: N/A  . Number of children: N/A  . Years of education: N/A   Occupational History  . Retired     laid off in 2010   Social History Main Topics  . Smoking status: Former Smoker    Types: Cigarettes    Quit date: 03/21/1993  . Smokeless tobacco: Not on file  . Alcohol use Yes     Comment: rare  . Drug use: No  . Sexual activity: Not on file   Other Topics Concern  . Not on file   Social History Narrative   Regular exercise-swims once day   Daily Caffeine Use: 1-2 cups per day    Current Outpatient Prescriptions on File Prior to Visit  Medication Sig Dispense Refill  . allopurinol (ZYLOPRIM) 300 MG tablet Take 1 tablet (300  mg total) by mouth daily. 90 tablet 3  . aspirin (ASPIRIN ADULT LOW STRENGTH) 81 MG EC tablet Take 81 mg by mouth once.     . B-D UF III MINI PEN NEEDLES 31G X 5 MM MISC USE 1 PEN NEEDLE EVERY DAY AS NEEDED AS DIRECTED 540 each 1  . BD ULTRA-FINE PEN NEEDLES 29G X 12.7MM MISC USE 1 PEN NEEDLE AS DIRECTED TO INJECT AS NEEDED 270 each 2  . clotrimazole-betamethasone (LOTRISONE) cream Apply topically 2 (two) times daily. 90 g 3  . desonide (DESOWEN) 0.05 % ointment Apply 1 application topically daily as needed (skin irrtation).     Marland Kitchen econazole nitrate 1 % cream Apply 1 application topically daily as needed (skin irritation).     . ferrous sulfate 325 (65 FE) MG tablet Take 325 mg by mouth every other day.     . fluocinonide (LIDEX) 0.05 % external solution Apply 1 application topically daily as needed (Skin irritation).     . insulin aspart (NOVOLOG FLEXPEN) 100 UNIT/ML FlexPen Inject 70 Units into the skin 3 (three) times daily with meals. and pen needles 10/day 60 mL 11  . Insulin Pen Needle 31G X 8 MM MISC Use to inject insulin 4 time  per day 150 each 2  . LANTUS SOLOSTAR 100 UNIT/ML Solostar Pen INJECT 120 UNITS INTO THE SKIN AT BEDTIME 45 mL 1  . Lorcaserin HCl (BELVIQ) 10 MG TABS Take by mouth 2 (two) times daily.    . Multiple Vitamin (MULTIVITAMIN) tablet Take 1 tablet by mouth daily.      . Probiotic Product (PROBIOTIC DAILY PO) Take by mouth.    . simvastatin (ZOCOR) 40 MG tablet Take 1 tablet (40 mg total) by mouth daily. 90 tablet 3  . tamsulosin (FLOMAX) 0.4 MG CAPS capsule Take 1 capsule (0.4 mg total) by mouth daily. 90 capsule 3  . cholecalciferol (VITAMIN D) 1000 UNITS tablet Take 2,000 Units by mouth daily.    . furosemide (LASIX) 40 MG tablet Take 1 tablet (40 mg total) by mouth daily. (Patient not taking: Reported on 12/13/2015) 90 tablet 3   No current facility-administered medications on file prior to visit.     No Known Allergies  Family History  Problem Relation Age of  Onset  . Crohn's disease Father   . Colon cancer Neg Hx    BP 128/64   Pulse 76   Ht 6\' 3"  (1.905 m)   Wt (!) 400 lb (181.4 kg)   SpO2 96%   BMI 50.00 kg/m   Review of Systems Denies fever and drainage.      Objective:   Physical Exam VITAL SIGNS:  See vs page GENERAL: no distress Right great toe: shallow ulcer, transverse, 2x7 cm, with skin still attached. No erythema, swelling, or drainage.      Assessment & Plan:  Foot ulcer, new.   Insulin-requiring type 2 DM, with polyneuropathy.  This patient would benefit from diabetic shoes--I told pt I would do the form.    Patient is advised the following: Patient Instructions  Please see a wound care specialist.  you will receive a phone call, about a day and time for an appointment.   until the appointment, keep the ulcer covered with antibiotic ointment and a large bandaid.   Here is a prescription, for a boot.

## 2015-12-13 NOTE — Telephone Encounter (Signed)
2:45 today 

## 2015-12-22 DIAGNOSIS — I83215 Varicose veins of right lower extremity with both ulcer other part of foot and inflammation: Secondary | ICD-10-CM | POA: Diagnosis not present

## 2015-12-22 DIAGNOSIS — L97519 Non-pressure chronic ulcer of other part of right foot with unspecified severity: Secondary | ICD-10-CM | POA: Diagnosis not present

## 2015-12-22 DIAGNOSIS — I8312 Varicose veins of left lower extremity with inflammation: Secondary | ICD-10-CM | POA: Diagnosis not present

## 2015-12-22 DIAGNOSIS — Z6841 Body Mass Index (BMI) 40.0 and over, adult: Secondary | ICD-10-CM | POA: Diagnosis not present

## 2015-12-22 DIAGNOSIS — M109 Gout, unspecified: Secondary | ICD-10-CM | POA: Diagnosis not present

## 2015-12-22 DIAGNOSIS — J449 Chronic obstructive pulmonary disease, unspecified: Secondary | ICD-10-CM | POA: Diagnosis not present

## 2015-12-22 DIAGNOSIS — E10621 Type 1 diabetes mellitus with foot ulcer: Secondary | ICD-10-CM | POA: Diagnosis not present

## 2015-12-22 DIAGNOSIS — L03116 Cellulitis of left lower limb: Secondary | ICD-10-CM | POA: Diagnosis not present

## 2015-12-28 DIAGNOSIS — M79672 Pain in left foot: Secondary | ICD-10-CM | POA: Diagnosis not present

## 2015-12-28 DIAGNOSIS — S90424D Blister (nonthermal), right lesser toe(s), subsequent encounter: Secondary | ICD-10-CM | POA: Diagnosis not present

## 2015-12-28 DIAGNOSIS — L97509 Non-pressure chronic ulcer of other part of unspecified foot with unspecified severity: Secondary | ICD-10-CM | POA: Diagnosis not present

## 2015-12-31 ENCOUNTER — Other Ambulatory Visit: Payer: Self-pay | Admitting: Endocrinology

## 2015-12-31 DIAGNOSIS — M7989 Other specified soft tissue disorders: Secondary | ICD-10-CM | POA: Diagnosis not present

## 2015-12-31 DIAGNOSIS — I890 Lymphedema, not elsewhere classified: Secondary | ICD-10-CM | POA: Diagnosis not present

## 2015-12-31 DIAGNOSIS — M7730 Calcaneal spur, unspecified foot: Secondary | ICD-10-CM | POA: Diagnosis not present

## 2015-12-31 DIAGNOSIS — I872 Venous insufficiency (chronic) (peripheral): Secondary | ICD-10-CM | POA: Diagnosis not present

## 2015-12-31 DIAGNOSIS — M7731 Calcaneal spur, right foot: Secondary | ICD-10-CM | POA: Diagnosis not present

## 2016-01-18 DIAGNOSIS — I878 Other specified disorders of veins: Secondary | ICD-10-CM | POA: Diagnosis not present

## 2016-01-18 DIAGNOSIS — M7731 Calcaneal spur, right foot: Secondary | ICD-10-CM | POA: Diagnosis not present

## 2016-01-18 DIAGNOSIS — B351 Tinea unguium: Secondary | ICD-10-CM | POA: Diagnosis not present

## 2016-01-18 DIAGNOSIS — Z872 Personal history of diseases of the skin and subcutaneous tissue: Secondary | ICD-10-CM | POA: Diagnosis not present

## 2016-01-18 DIAGNOSIS — E1142 Type 2 diabetes mellitus with diabetic polyneuropathy: Secondary | ICD-10-CM | POA: Diagnosis not present

## 2016-02-07 ENCOUNTER — Telehealth: Payer: Self-pay | Admitting: Endocrinology

## 2016-02-07 MED ORDER — BASAGLAR KWIKPEN 100 UNIT/ML ~~LOC~~ SOPN: 120.0000 [IU] | PEN_INJECTOR | Freq: Every day | SUBCUTANEOUS | 2 refills | Status: DC

## 2016-02-07 NOTE — Telephone Encounter (Signed)
The patient is needing a new Rx for Basaglar insulin pen instead of LANTUS SOLOSTAR 100 UNIT/ML Solostar Pen  faxed to:  [facility] - [place], [place] - [phone]  His insurance won't cover the Lantus Pen.  He needs a monthly Rx for 3 boxes of 5 pens that equals 15 pens total.

## 2016-02-07 NOTE — Telephone Encounter (Signed)
Refill submitted per patient's request.  

## 2016-02-07 NOTE — Telephone Encounter (Signed)
See message and please advise, Thanks!  

## 2016-02-07 NOTE — Telephone Encounter (Signed)
The patient received a bill DOS 12-13-2015 for an ambulatory boot that he was given the Rx to go get the boot and the patient went somewhere else.

## 2016-02-08 NOTE — Telephone Encounter (Signed)
I contacted the patient and requested a call back. We need more information as to what he needs from [place] regarding this message.

## 2016-02-09 NOTE — Telephone Encounter (Signed)
I contacted the patient and he advised me he located th receipt for the boot he was charged for form our office and will be bringing it to [place] at his next appointment to look over. Patient had no other questions at this time.

## 2016-02-09 NOTE — Telephone Encounter (Signed)
Patient need to talk to you concerning a ambulatory boot.

## 2016-02-14 ENCOUNTER — Telehealth: Payer: Self-pay | Admitting: Endocrinology

## 2016-02-14 NOTE — Telephone Encounter (Signed)
Pt is aware of the Tdap payment and apparently contacted the billing company and this is taken care of. There are no further outstanding charges per the pt.

## 2016-03-21 DIAGNOSIS — D235 Other benign neoplasm of skin of trunk: Secondary | ICD-10-CM | POA: Diagnosis not present

## 2016-03-21 DIAGNOSIS — L820 Inflamed seborrheic keratosis: Secondary | ICD-10-CM | POA: Diagnosis not present

## 2016-03-24 ENCOUNTER — Encounter: Payer: Self-pay | Admitting: Endocrinology

## 2016-03-24 ENCOUNTER — Ambulatory Visit (INDEPENDENT_AMBULATORY_CARE_PROVIDER_SITE_OTHER): Payer: Medicare Other | Admitting: Endocrinology

## 2016-03-24 ENCOUNTER — Other Ambulatory Visit: Payer: Self-pay

## 2016-03-24 VITALS — BP 132/64 | HR 86 | Ht 75.0 in | Wt 397.0 lb

## 2016-03-24 DIAGNOSIS — E1042 Type 1 diabetes mellitus with diabetic polyneuropathy: Secondary | ICD-10-CM | POA: Diagnosis not present

## 2016-03-24 LAB — POCT GLYCOSYLATED HEMOGLOBIN (HGB A1C): HEMOGLOBIN A1C: 7.9

## 2016-03-24 MED ORDER — TAMSULOSIN HCL 0.4 MG PO CAPS: 0.4000 mg | ORAL_CAPSULE | Freq: Every day | ORAL | 3 refills | Status: DC

## 2016-03-24 MED ORDER — FUROSEMIDE 40 MG PO TABS: 40.0000 mg | ORAL_TABLET | Freq: Every day | ORAL | 3 refills | Status: DC

## 2016-03-24 MED ORDER — INSULIN ASPART 100 UNIT/ML FLEXPEN: 70.0000 [IU] | PEN_INJECTOR | Freq: Three times a day (TID) | SUBCUTANEOUS | 2 refills | Status: DC

## 2016-03-24 MED ORDER — SIMVASTATIN 40 MG PO TABS: 40.0000 mg | ORAL_TABLET | Freq: Every day | ORAL | 3 refills | Status: DC

## 2016-03-24 MED ORDER — ALLOPURINOL 300 MG PO TABS: 300.0000 mg | ORAL_TABLET | Freq: Every day | ORAL | 3 refills | Status: DC

## 2016-03-24 MED ORDER — BASAGLAR KWIKPEN 100 UNIT/ML ~~LOC~~ SOPN: 140.0000 [IU] | PEN_INJECTOR | Freq: Every day | SUBCUTANEOUS | 2 refills | Status: DC

## 2016-03-24 NOTE — Patient Instructions (Addendum)
check your blood sugar twice a day.  vary the time of day when you check, between before the 3 meals, and at bedtime.  also check if you have symptoms of your blood sugar being too high or too low.  please keep a record of the readings and bring it to your next appointment here (or you can bring the meter itself).  You can write it on any piece of paper.  please call us sooner if your blood sugar goes below 70, or if you have a lot of readings over 200. Please increase the basalgar to 140 units each morning.   Please come back for a follow-up appointment in 3-4 months.

## 2016-03-24 NOTE — Progress Notes (Signed)
Subjective:    Patient ID: [name], male    DOB: 1940/04/24, 76 y.o.   MRN: [id number]  HPI Pt returns for f/u of diabetes mellitus:  DM type: insulin-requiring type 2. Dx'ed: [id number] Complications: CAD, nephropathy, and peripheral sensory neuropathy.    Therapy: insulin since 2004.   DKA: never.   Severe hypoglycemia: never.   Pancreatitis: never.   Other: he takes multiple daily injections. He declines weight-loss surgery.   Interval history: he takes lantus, 120 qhs, and novolog 70 units tid.  He has had hypoglycemia only once since last ov, and this was mild.  This happens when he accidentally took 2 doses of humalog.  He says he never misses the insulin.  no cbg record, but states cbg's are highest fasting.   Past Medical History:  Diagnosis Date  . Anemia, unspecified   . Arthritis   . Colitis 1997   nonspecific  . Diabetes mellitus    Type I  . DM peripheral angiopathy ([place])   . DM retinopathy ([place])   . ED (erectile dysfunction)   . Hyperlipidemia   . Multinodular goiter   . NASH (nonalcoholic steatohepatitis)   . Osteoarthritis   . Sleep apnea   . Tubular adenoma of colon     Past Surgical History:  Procedure Laterality Date  . APPENDECTOMY    . COLONOSCOPY WITH PROPOFOL N/A 03/23/2014   Procedure: COLONOSCOPY WITH PROPOFOL;  Surgeon: Ladene Artist, MD;  [facility];  Service: Endoscopy;  Laterality: N/A;  . CORONARY ARTERY BYPASS GRAFT  2007   x 1  . ESOPHAGOGASTRODUODENOSCOPY  02/04/2003   normal  . ESOPHAGOGASTRODUODENOSCOPY (EGD) WITH PROPOFOL N/A 03/23/2014   Procedure: ESOPHAGOGASTRODUODENOSCOPY (EGD) WITH PROPOFOL;  Surgeon: Ladene Artist, MD;  [facility];  Service: Endoscopy;  Laterality: N/A;  . THORACOTOMY  2005   S/P Right Throactomy for Empyema    Social History   Social History  . Marital status: Married    Spouse name: N/A  . Number of children: N/A  . Years of education: N/A   Occupational History  .  Retired     laid off in 2010   Social History Main Topics  . Smoking status: Former Smoker    Types: Cigarettes    Quit date: 03/21/1993  . Smokeless tobacco: Never Used  . Alcohol use Yes     Comment: rare  . Drug use: No  . Sexual activity: Not on file   Other Topics Concern  . Not on file   Social History Narrative   Regular exercise-swims once day   Daily Caffeine Use: 1-2 cups per day    Current Outpatient Prescriptions on File Prior to Visit  Medication Sig Dispense Refill  . aspirin (ASPIRIN ADULT LOW STRENGTH) 81 MG EC tablet Take 81 mg by mouth once.     . cholecalciferol (VITAMIN D) 1000 UNITS tablet Take 2,000 Units by mouth daily.    . Cholecalciferol (VITAMIN D3) 5000 units CAPS Take by mouth.    . clotrimazole-betamethasone (LOTRISONE) cream Apply topically 2 (two) times daily. 90 g 3  . desonide (DESOWEN) 0.05 % ointment Apply 1 application topically daily as needed (skin irrtation).     Marland Kitchen econazole nitrate 1 % cream Apply 1 application topically daily as needed (skin irritation).     . ferrous sulfate 325 (65 FE) MG tablet Take 325 mg by mouth every other day.     . fluocinonide (LIDEX) 0.05 % external solution  Apply 1 application topically daily as needed (Skin irritation).     . hydrochlorothiazide (HYDRODIURIL) 25 MG tablet Take 25 mg by mouth daily.    . Insulin Pen Needle 31G X 8 MM MISC Use to inject insulin 4 time per day 150 each 2  . Lorcaserin HCl (BELVIQ) 10 MG TABS Take by mouth 2 (two) times daily.    . Multiple Vitamin (MULTIVITAMIN) tablet Take 1 tablet by mouth daily.      . Probiotic Product (PROBIOTIC DAILY PO) Take by mouth.     No current facility-administered medications on file prior to visit.     No Known Allergies  Family History  Problem Relation Age of Onset  . Crohn's disease Father   . Colon cancer Neg Hx     BP 132/64   Pulse 86   Ht 6\' 3"  (1.905 m)   Wt (!) 397 lb (180.1 kg)   SpO2 96%   BMI 49.62 kg/m   Review of  Systems Denies LOC.     Objective:   Physical Exam VITAL SIGNS:  See vs page GENERAL: no distress Pulses: dorsalis pedis intact bilat.   MSK: no deformity of the feet.  CV: 2+ bilat leg edema.  Skin: no ulcer on the feet. normal color and temp on the feet. Patchy hyperpigmentation at the anterior tibial areas.  Neuro: sensation is intact to touch on the feet, but decreased from normal.  Ext: There is bilateral onychomycosis of the toenails.    A1c=7.9%    Assessment & Plan:  Insulin-requiring type 2 DM: he needs increased rx.    Patient is advised the following: Patient Instructions  check your blood sugar twice a day.  vary the time of day when you check, between before the 3 meals, and at bedtime.  also check if you have symptoms of your blood sugar being too high or too low.  please keep a record of the readings and bring it to your next appointment here (or you can bring the meter itself).  You can write it on any piece of paper.  please call us sooner if your blood sugar goes below 70, or if you have a lot of readings over 200. Please increase the basalgar to 140 units each morning.   Please come back for a follow-up appointment in 3-4 months.

## 2016-04-17 DIAGNOSIS — G4733 Obstructive sleep apnea (adult) (pediatric): Secondary | ICD-10-CM | POA: Diagnosis not present

## 2016-04-18 DIAGNOSIS — R0602 Shortness of breath: Secondary | ICD-10-CM | POA: Diagnosis not present

## 2016-04-18 DIAGNOSIS — Z9989 Dependence on other enabling machines and devices: Secondary | ICD-10-CM | POA: Diagnosis not present

## 2016-04-18 DIAGNOSIS — G4733 Obstructive sleep apnea (adult) (pediatric): Secondary | ICD-10-CM | POA: Diagnosis not present

## 2016-04-18 DIAGNOSIS — I878 Other specified disorders of veins: Secondary | ICD-10-CM | POA: Diagnosis not present

## 2016-04-18 DIAGNOSIS — S90821A Blister (nonthermal), right foot, initial encounter: Secondary | ICD-10-CM | POA: Diagnosis not present

## 2016-04-18 DIAGNOSIS — E1142 Type 2 diabetes mellitus with diabetic polyneuropathy: Secondary | ICD-10-CM | POA: Diagnosis not present

## 2016-04-18 DIAGNOSIS — B351 Tinea unguium: Secondary | ICD-10-CM | POA: Diagnosis not present

## 2016-04-18 DIAGNOSIS — J984 Other disorders of lung: Secondary | ICD-10-CM | POA: Diagnosis not present

## 2016-04-18 DIAGNOSIS — M7731 Calcaneal spur, right foot: Secondary | ICD-10-CM | POA: Diagnosis not present

## 2016-04-18 DIAGNOSIS — M7989 Other specified soft tissue disorders: Secondary | ICD-10-CM | POA: Diagnosis not present

## 2016-04-25 ENCOUNTER — Telehealth: Payer: Self-pay | Admitting: Endocrinology

## 2016-04-25 MED ORDER — SIMVASTATIN 40 MG PO TABS: 40.0000 mg | ORAL_TABLET | Freq: Every day | ORAL | 3 refills | Status: DC

## 2016-04-25 NOTE — Telephone Encounter (Signed)
Refill of  simvastatin (ZOCOR) 40 MG tablet 90 tablet   Send to [facility] in [place]  Patient also has question about how his tdap was billed.

## 2016-04-25 NOTE — Telephone Encounter (Signed)
error 

## 2016-04-25 NOTE — Telephone Encounter (Signed)
I first need to know what dx would be reimbursed.

## 2016-04-25 NOTE — Telephone Encounter (Signed)
Patient called to speak to CMA about a claim. Patient said that CMA was expecting a call from him. Please call to advise.

## 2016-04-25 NOTE — Telephone Encounter (Signed)
I contacted the patient and advised the Rx has been submitted to [facility]. Pateint advised to contact the billing department about his tdap question. Patient voiced understanding.

## 2016-04-25 NOTE — Telephone Encounter (Signed)
I contacted the patient. He stated he he was charged 72$ for tdap. Patient stated he contacted his insurance and his injection was denied coverage because it was billed under preventative health instead of medically neccessary. Patient wanted to know if a letter could be resubmitted stating it was medically necessary because of his toe injury and his diabetes, or if we could resubmit the claim under medically necessary? Please advise, Thanks!

## 2016-04-26 NOTE — Telephone Encounter (Signed)
Patient stated the diagnosis was not the problem. Patient was advised the billing department made an error and billed under preventative health instead of medically necessary. Patient wanted to know if a letter could be formulated to state the Tdap vaccine was medically necessary due to the injury to his toe and due to his diagnosis of diabetes so he could do an appeal for his insurance. Thanks!

## 2016-04-27 NOTE — Telephone Encounter (Signed)
Patient is returning your call.  

## 2016-04-27 NOTE — Telephone Encounter (Signed)
Patient notified his tdap medical necessity letter has been completed. Patient requested the letter to be mailed to him. Address confirmed with the patient and placed in the mail.

## 2016-04-27 NOTE — Telephone Encounter (Signed)
Requested a call back from the patient to further discuss.  

## 2016-04-27 NOTE — Telephone Encounter (Signed)
I did letter

## 2016-05-03 DIAGNOSIS — I519 Heart disease, unspecified: Secondary | ICD-10-CM | POA: Diagnosis not present

## 2016-05-03 DIAGNOSIS — I517 Cardiomegaly: Secondary | ICD-10-CM | POA: Diagnosis not present

## 2016-06-16 ENCOUNTER — Other Ambulatory Visit: Payer: Self-pay | Admitting: Endocrinology

## 2016-06-18 ENCOUNTER — Other Ambulatory Visit: Payer: Self-pay | Admitting: Endocrinology

## 2016-06-27 ENCOUNTER — Other Ambulatory Visit: Payer: Self-pay | Admitting: Endocrinology

## 2016-06-29 ENCOUNTER — Telehealth: Payer: Self-pay | Admitting: Endocrinology

## 2016-06-29 NOTE — Telephone Encounter (Signed)
Please call [facility] provider line to assist with PA for Tetanus inj.  Thank you,  -LL

## 2016-06-29 NOTE — Telephone Encounter (Signed)
Spoke with [facility] and did a verbal for tdap that was done in our office 12/13/15- patient is trying to get this covered and reimursed I answered all of the clinical questions I could and I belive this to be resolved

## 2016-06-29 NOTE — Telephone Encounter (Signed)
[facility] stated that he was speaking to Coy earlier and needed to speak to her again, there was not a telephone note int the system. The representative stated that he would call back another time.

## 2016-07-25 ENCOUNTER — Ambulatory Visit (INDEPENDENT_AMBULATORY_CARE_PROVIDER_SITE_OTHER): Payer: Medicare Other | Admitting: Endocrinology

## 2016-07-25 ENCOUNTER — Encounter: Payer: Self-pay | Admitting: Endocrinology

## 2016-07-25 VITALS — BP 142/88 | HR 89 | Wt 399.0 lb

## 2016-07-25 DIAGNOSIS — E1042 Type 1 diabetes mellitus with diabetic polyneuropathy: Secondary | ICD-10-CM | POA: Diagnosis not present

## 2016-07-25 LAB — POCT GLYCOSYLATED HEMOGLOBIN (HGB A1C): Hemoglobin A1C: 8.1

## 2016-07-25 MED ORDER — INSULIN ASPART 100 UNIT/ML FLEXPEN: 80.0000 [IU] | PEN_INJECTOR | Freq: Three times a day (TID) | SUBCUTANEOUS | 2 refills | Status: DC

## 2016-07-25 NOTE — Patient Instructions (Addendum)
check your blood sugar twice a day.  vary the time of day when you check, between before the 3 meals, and at bedtime.  also check if you have symptoms of your blood sugar being too high or too low.  please keep a record of the readings and bring it to your next appointment here (or you can bring the meter itself).  You can write it on any piece of paper.  please call us sooner if your blood sugar goes below 70, or if you have a lot of readings over 200. Please increase the novolog to 80 units 3 times a day (just before each meal).   If you decide to change to [facility] insulin, change the basaglar to NPH, 90 units at bedtime, and: Change the novolog to regular, 90 units 3 times a day (just before each meal).   Please come back for a follow-up appointment in 4 months.

## 2016-07-25 NOTE — Progress Notes (Addendum)
Subjective:    Patient ID: [name], male    DOB: Nov 22, 1940, 76 y.o.   MRN: [id number]  HPI Pt returns for f/u of diabetes mellitus:  DM type: insulin-requiring type 2. Dx'ed: 5188 Complications: CAD, nephropathy, foot ulcer, and peripheral sensory neuropathy.    Therapy: insulin since 2004.   DKA: never.   Severe hypoglycemia: never.   Pancreatitis: never.   Other: he takes multiple daily injections. He declines weight-loss surgery.   Interval history:  He says he never misses the insulin. He says cbg's vary from 164-300.  There is no trend throughout the day.  Past Medical History:  Diagnosis Date  . Anemia, unspecified   . Arthritis   . Colitis 1997   nonspecific  . Diabetes mellitus    Type I  . DM peripheral angiopathy ([place])   . DM retinopathy ([place])   . ED (erectile dysfunction)   . Hyperlipidemia   . Multinodular goiter   . NASH (nonalcoholic steatohepatitis)   . Osteoarthritis   . Sleep apnea   . Tubular adenoma of colon     Past Surgical History:  Procedure Laterality Date  . APPENDECTOMY    . COLONOSCOPY WITH PROPOFOL N/A 03/23/2014   Procedure: COLONOSCOPY WITH PROPOFOL;  Surgeon: Ladene Artist, MD;  [facility];  Service: Endoscopy;  Laterality: N/A;  . CORONARY ARTERY BYPASS GRAFT  2007   x 1  . ESOPHAGOGASTRODUODENOSCOPY  02/04/2003   normal  . ESOPHAGOGASTRODUODENOSCOPY (EGD) WITH PROPOFOL N/A 03/23/2014   Procedure: ESOPHAGOGASTRODUODENOSCOPY (EGD) WITH PROPOFOL;  Surgeon: Ladene Artist, MD;  [facility];  Service: Endoscopy;  Laterality: N/A;  . THORACOTOMY  2005   S/P Right Throactomy for Empyema    Social History   Social History  . Marital status: Married    Spouse name: N/A  . Number of children: N/A  . Years of education: N/A   Occupational History  . Retired     laid off in 2010   Social History Main Topics  . Smoking status: Former Smoker    Types: Cigarettes    Quit date: 03/21/1993  . Smokeless  tobacco: Never Used  . Alcohol use Yes     Comment: rare  . Drug use: No  . Sexual activity: Not on file   Other Topics Concern  . Not on file   Social History Narrative   Regular exercise-swims once day   Daily Caffeine Use: 1-2 cups per day    Current Outpatient Prescriptions on File Prior to Visit  Medication Sig Dispense Refill  . allopurinol (ZYLOPRIM) 300 MG tablet TAKE ONE TABLET BY MOUTH ONCE DAILY 90 tablet 3  . aspirin (ASPIRIN ADULT LOW STRENGTH) 81 MG EC tablet Take 81 mg by mouth once.     . cholecalciferol (VITAMIN D) 1000 UNITS tablet Take 2,000 Units by mouth daily.     . Cholecalciferol (VITAMIN D3) 5000 units CAPS Take by mouth.    . clotrimazole-betamethasone (LOTRISONE) cream Apply topically 2 (two) times daily. 90 g 3  . desonide (DESOWEN) 0.05 % ointment Apply 1 application topically daily as needed (skin irrtation).     Marland Kitchen econazole nitrate 1 % cream Apply 1 application topically daily as needed (skin irritation).     . ferrous sulfate 325 (65 FE) MG tablet Take 325 mg by mouth every other day.     . fluocinonide (LIDEX) 0.05 % external solution Apply 1 application topically daily as needed (Skin irritation).     Marland Kitchen  furosemide (LASIX) 40 MG tablet Take 1 tablet (40 mg total) by mouth daily. 90 tablet 3  . hydrochlorothiazide (HYDRODIURIL) 25 MG tablet Take 25 mg by mouth daily.    . Insulin Glargine (BASAGLAR KWIKPEN) 100 UNIT/ML SOPN Inject 1.4 mLs (140 Units total) into the skin at bedtime. 45 pen 2  . Insulin Pen Needle 31G X 8 MM MISC Use to inject insulin 4 time per day 150 each 2  . Lorcaserin HCl (BELVIQ) 10 MG TABS Take by mouth 2 (two) times daily.    . Multiple Vitamin (MULTIVITAMIN) tablet Take 1 tablet by mouth daily.      . Probiotic Product (PROBIOTIC DAILY PO) Take by mouth.    . simvastatin (ZOCOR) 40 MG tablet Take 1 tablet (40 mg total) by mouth daily. 90 tablet 3  . tamsulosin (FLOMAX) 0.4 MG CAPS capsule Take 1 capsule (0.4 mg total) by mouth  daily. 90 capsule 3   No current facility-administered medications on file prior to visit.     No Known Allergies  Family History  Problem Relation Age of Onset  . Crohn's disease Father   . Colon cancer Neg Hx     BP (!) 142/88 (BP Location: Left Arm, Patient Position: Sitting)   Pulse 89   Wt (!) 399 lb (181 kg)   SpO2 96%   BMI 49.87 kg/m    Review of Systems He denies hypoglycemia.     Objective:   Physical Exam VITAL SIGNS:  See vs page GENERAL: no distress Pulses: dorsalis pedis intact bilat. MSK: no deformity of the feet.  CV: 2+ bilat leg edema.  Skin: no ulcer on the feet. normal color and temp on the feet. there is hyperpigmentation at both anterior tibial areas.  Neuro: sensation is intact to touch on the feet, but decreased from normal.  Ext: There is bilateral onychomycosis of the toenails.    A1c=8.1%    Assessment & Plan:  Insulin-requiring type 2 DM, with CAD: worse.  He also requests cheaper rx. HTN, with poss situational component: recheck next time.    Patient Instructions  check your blood sugar twice a day.  vary the time of day when you check, between before the 3 meals, and at bedtime.  also check if you have symptoms of your blood sugar being too high or too low.  please keep a record of the readings and bring it to your next appointment here (or you can bring the meter itself).  You can write it on any piece of paper.  please call us sooner if your blood sugar goes below 70, or if you have a lot of readings over 200. Please increase the novolog to 80 units 3 times a day (just before each meal).   If you decide to change to [facility] insulin, change the basaglar to NPH, 90 units at bedtime, and: Change the novolog to regular, 90 units 3 times a day (just before each meal).   Please come back for a follow-up appointment in 4 months.

## 2016-09-26 DIAGNOSIS — L219 Seborrheic dermatitis, unspecified: Secondary | ICD-10-CM | POA: Diagnosis not present

## 2016-09-26 DIAGNOSIS — L820 Inflamed seborrheic keratosis: Secondary | ICD-10-CM | POA: Diagnosis not present

## 2016-09-26 DIAGNOSIS — L570 Actinic keratosis: Secondary | ICD-10-CM | POA: Diagnosis not present

## 2016-10-31 ENCOUNTER — Other Ambulatory Visit: Payer: Self-pay

## 2016-10-31 ENCOUNTER — Telehealth: Payer: Self-pay | Admitting: Endocrinology

## 2016-10-31 MED ORDER — TAMSULOSIN HCL 0.4 MG PO CAPS: 0.4000 mg | ORAL_CAPSULE | Freq: Every day | ORAL | 3 refills | Status: DC

## 2016-10-31 MED ORDER — ALLOPURINOL 300 MG PO TABS: 300.0000 mg | ORAL_TABLET | Freq: Every day | ORAL | 1 refills | Status: DC

## 2016-10-31 NOTE — Telephone Encounter (Signed)
MEDICATION:  allopurinol (ZYLOPRIM) 300 MG tablet  tamsulosin (FLOMAX) 0.4 MG CAPS capsule  PHARMACY:  [id number] - [place], [place] - [address]  IS THIS A 90 DAY SUPPLY : YES  IS PATIENT OUT OF MEDICATION: NO  IF NOT; HOW MUCH IS LEFT: "a few days worth"  LAST APPOINTMENT DATE: @6 /26/18  NEXT APPOINTMENT DATE:@10 /25/18  OTHER COMMENTS: DO NOT SEND INSURANCE INFO TO THIS PHARMACY, JUST SEND THE RX HE IS PAYING CASH   **Let patient know to contact pharmacy at the end of the day to make sure medication is ready. **  ** Please notify patient to allow 48-72 hours to process**  **Encourage patient to contact the pharmacy for refills or they can request refills through [facility]**

## 2016-11-16 DIAGNOSIS — Z23 Encounter for immunization: Secondary | ICD-10-CM | POA: Diagnosis not present

## 2016-11-23 ENCOUNTER — Encounter: Payer: Self-pay | Admitting: Endocrinology

## 2016-11-23 ENCOUNTER — Ambulatory Visit (INDEPENDENT_AMBULATORY_CARE_PROVIDER_SITE_OTHER): Payer: Medicare Other | Admitting: Endocrinology

## 2016-11-23 VITALS — BP 138/82 | HR 92 | Wt >= 6400 oz

## 2016-11-23 DIAGNOSIS — Z125 Encounter for screening for malignant neoplasm of prostate: Secondary | ICD-10-CM | POA: Diagnosis not present

## 2016-11-23 DIAGNOSIS — E1159 Type 2 diabetes mellitus with other circulatory complications: Secondary | ICD-10-CM

## 2016-11-23 DIAGNOSIS — Z0000 Encounter for general adult medical examination without abnormal findings: Secondary | ICD-10-CM | POA: Diagnosis not present

## 2016-11-23 DIAGNOSIS — Z794 Long term (current) use of insulin: Secondary | ICD-10-CM | POA: Diagnosis not present

## 2016-11-23 DIAGNOSIS — M109 Gout, unspecified: Secondary | ICD-10-CM

## 2016-11-23 DIAGNOSIS — E1042 Type 1 diabetes mellitus with diabetic polyneuropathy: Secondary | ICD-10-CM | POA: Diagnosis not present

## 2016-11-23 LAB — CBC WITH DIFFERENTIAL/PLATELET
BASOS PCT: 1.4 % (ref 0.0–3.0)
Basophils Absolute: 0.1 10*3/uL (ref 0.0–0.1)
EOS ABS: 0.5 10*3/uL (ref 0.0–0.7)
EOS PCT: 6.3 % — AB (ref 0.0–5.0)
HCT: 44 % (ref 39.0–52.0)
HEMOGLOBIN: 14.4 g/dL (ref 13.0–17.0)
LYMPHS ABS: 1.7 10*3/uL (ref 0.7–4.0)
Lymphocytes Relative: 19.9 % (ref 12.0–46.0)
MCHC: 32.6 g/dL (ref 30.0–36.0)
MCV: 94 fl (ref 78.0–100.0)
MONO ABS: 1 10*3/uL (ref 0.1–1.0)
Monocytes Relative: 12 % (ref 3.0–12.0)
NEUTROS PCT: 60.4 % (ref 43.0–77.0)
Neutro Abs: 5 10*3/uL (ref 1.4–7.7)
PLATELETS: 239 10*3/uL (ref 150.0–400.0)
RBC: 4.69 Mil/uL (ref 4.22–5.81)
RDW: 13.9 % (ref 11.5–15.5)
WBC: 8.3 10*3/uL (ref 4.0–10.5)

## 2016-11-23 LAB — VITAMIN D 25 HYDROXY (VIT D DEFICIENCY, FRACTURES): VITD: 22.25 ng/mL — ABNORMAL LOW (ref 30.00–100.00)

## 2016-11-23 LAB — HEPATIC FUNCTION PANEL
ALK PHOS: 59 U/L (ref 39–117)
ALT: 35 U/L (ref 0–53)
AST: 32 U/L (ref 0–37)
Albumin: 3.8 g/dL (ref 3.5–5.2)
BILIRUBIN DIRECT: 0.1 mg/dL (ref 0.0–0.3)
BILIRUBIN TOTAL: 0.5 mg/dL (ref 0.2–1.2)
TOTAL PROTEIN: 6.5 g/dL (ref 6.0–8.3)

## 2016-11-23 LAB — URINALYSIS, ROUTINE W REFLEX MICROSCOPIC
BILIRUBIN URINE: NEGATIVE
Hgb urine dipstick: NEGATIVE
KETONES UR: NEGATIVE
Leukocytes, UA: NEGATIVE
NITRITE: NEGATIVE
RBC / HPF: NONE SEEN (ref 0–?)
Specific Gravity, Urine: 1.025 (ref 1.000–1.030)
URINE GLUCOSE: NEGATIVE
UROBILINOGEN UA: 0.2 (ref 0.0–1.0)
pH: 5.5 (ref 5.0–8.0)

## 2016-11-23 LAB — LIPID PANEL
CHOL/HDL RATIO: 3
Cholesterol: 124 mg/dL (ref 0–200)
HDL: 42 mg/dL (ref >39.00)
LDL CALC: 45 mg/dL (ref 0–99)
NONHDL: 82.39
TRIGLYCERIDES: 189 mg/dL — AB (ref 0.0–149.0)
VLDL: 37.8 mg/dL (ref 0.0–40.0)

## 2016-11-23 LAB — BASIC METABOLIC PANEL
BUN: 26 mg/dL — ABNORMAL HIGH (ref 6–23)
CALCIUM: 9.4 mg/dL (ref 8.4–10.5)
CO2: 31 mEq/L (ref 19–32)
Chloride: 103 mEq/L (ref 96–112)
Creatinine, Ser: 1.18 mg/dL (ref 0.40–1.50)
GFR: 63.95 mL/min (ref >60.00)
GLUCOSE: 162 mg/dL — AB (ref 70–99)
POTASSIUM: 4.4 meq/L (ref 3.5–5.1)
SODIUM: 142 meq/L (ref 135–145)

## 2016-11-23 LAB — POCT GLYCOSYLATED HEMOGLOBIN (HGB A1C): HEMOGLOBIN A1C: 7.2

## 2016-11-23 LAB — TSH: TSH: 2.93 u[IU]/mL (ref 0.35–4.50)

## 2016-11-23 LAB — MICROALBUMIN / CREATININE URINE RATIO
Creatinine,U: 195 mg/dL
Microalb Creat Ratio: 4.6 mg/g (ref 0.0–30.0)
Microalb, Ur: 9 mg/dL — ABNORMAL HIGH (ref 0.0–1.9)

## 2016-11-23 LAB — PSA, MEDICARE: PSA: 0.49 ng/ml (ref 0.10–4.00)

## 2016-11-23 LAB — URIC ACID: Uric Acid, Serum: 7.2 mg/dL (ref 4.0–7.8)

## 2016-11-23 MED ORDER — INSULIN NPH (HUMAN) (ISOPHANE) 100 UNIT/ML ~~LOC~~ SUSP: 80.0000 [IU] | Freq: Every day | SUBCUTANEOUS | 11 refills | Status: DC

## 2016-11-23 MED ORDER — INSULIN REGULAR HUMAN 100 UNIT/ML IJ SOLN: 80.0000 [IU] | Freq: Three times a day (TID) | INTRAMUSCULAR | 11 refills | Status: DC

## 2016-11-23 NOTE — Patient Instructions (Addendum)
check your blood sugar twice a day.  vary the time of day when you check, between before the 3 meals, and at bedtime.  also check if you have symptoms of your blood sugar being too high or too low.  please keep a record of the readings and bring it to your next appointment here (or you can bring the meter itself).  You can write it on any piece of paper.  please call us sooner if your blood sugar goes below 70, or if you have a lot of readings over 200. Please continue the same insulins.   good diet and exercise significantly improve the control of your diabetes.  please let me know if you wish to be referred to a dietician.  high blood sugar is very risky to your health.  you should see an eye doctor and dentist every year.  It is very important to get all recommended vaccinations.  Please consider these measures for your health:  minimize alcohol.  Do not use tobacco products.  Have a colonoscopy at least every 10 years from age 54.  Women should have an annual mammogram from age 82.  Keep firearms safely stored.  Always use seat belts.  have working smoke alarms in your home.  See an eye doctor and dentist regularly.  Never drive under the influence of alcohol or drugs (including prescription drugs).  Those with fair skin should take precautions against the sun, and should carefully examine their skin once per month, for any new or changed moles. It is critically important to prevent falling down (keep floor areas well-lit, dry, and free of loose objects.  If you have a cane, walker, or wheelchair, you should use it, even for short trips around the house.  Wear flat-soled shoes.  Also, try not to rush).   blood tests are requested for you today.  We'll let you know about the results. Please come back for a follow-up appointment in 4 months.

## 2016-11-23 NOTE — Progress Notes (Signed)
Subjective:    Patient ID: [name], male    DOB: [birth year], 76 y.o.   MRN: [id number]  HPI Pt returns for f/u of diabetes mellitus:  DM type: insulin-requiring type 2. Dx'ed: 4098 Complications: CAD, nephropathy, foot ulcer, and peripheral sensory neuropathy.    Therapy: insulin since 2004.   DKA: never.   Severe hypoglycemia: never.   Pancreatitis: never.   Other: he takes multiple daily injections. He declines weight-loss surgery; he takes human insulin, due to cost Interval history:  He has reduced insulin.  no cbg record, but states since the reduction.  He says cbg's are well-controlled in general.  It is in general higher as the day goes on Past Medical History:  Diagnosis Date  . Anemia, unspecified   . Arthritis   . Colitis 1997   nonspecific  . Diabetes mellitus    Type I  . DM peripheral angiopathy ([place])   . DM retinopathy ([place])   . ED (erectile dysfunction)   . Hyperlipidemia   . Multinodular goiter   . NASH (nonalcoholic steatohepatitis)   . Osteoarthritis   . Sleep apnea   . Tubular adenoma of colon     Past Surgical History:  Procedure Laterality Date  . APPENDECTOMY    . COLONOSCOPY WITH PROPOFOL N/A 03/23/2014   Procedure: COLONOSCOPY WITH PROPOFOL;  Surgeon: Ladene Artist, MD;  Location: [facility];  Service: Endoscopy;  Laterality: N/A;  . CORONARY ARTERY BYPASS GRAFT  2007   x 1  . ESOPHAGOGASTRODUODENOSCOPY  02/04/2003   normal  . ESOPHAGOGASTRODUODENOSCOPY (EGD) WITH PROPOFOL N/A 03/23/2014   Procedure: ESOPHAGOGASTRODUODENOSCOPY (EGD) WITH PROPOFOL;  Surgeon: Ladene Artist, MD;  Location: [facility];  Service: Endoscopy;  Laterality: N/A;  . THORACOTOMY  2005   S/P Right Throactomy for Empyema    Social History   Social History  . Marital status: Married    Spouse name: N/A  . Number of children: N/A  . Years of education: N/A   Occupational History  . Retired     laid off in 2010   Social History Main Topics  .  Smoking status: Former Smoker    Types: Cigarettes    Quit date: 03/21/1993  . Smokeless tobacco: Never Used  . Alcohol use Yes     Comment: rare  . Drug use: No  . Sexual activity: Not on file   Other Topics Concern  . Not on file   Social History Narrative   Regular exercise-swims once day   Daily Caffeine Use: 1-2 cups per day    Current Outpatient Prescriptions on File Prior to Visit  Medication Sig Dispense Refill  . allopurinol (ZYLOPRIM) 300 MG tablet Take 1 tablet (300 mg total) by mouth daily. 90 tablet 1  . aspirin (ASPIRIN ADULT LOW STRENGTH) 81 MG EC tablet Take 81 mg by mouth once.     . cholecalciferol (VITAMIN D) 1000 UNITS tablet Take 2,000 Units by mouth daily.     . Cholecalciferol (VITAMIN D3) 5000 units CAPS Take by mouth.    . clotrimazole-betamethasone (LOTRISONE) cream Apply topically 2 (two) times daily. 90 g 3  . desonide (DESOWEN) 0.05 % ointment Apply 1 application topically daily as needed (skin irrtation).     Marland Kitchen econazole nitrate 1 % cream Apply 1 application topically daily as needed (skin irritation).     . ferrous sulfate 325 (65 FE) MG tablet Take 325 mg by mouth every other day.     . fluocinonide (  LIDEX) 0.05 % external solution Apply 1 application topically daily as needed (Skin irritation).     . furosemide (LASIX) 40 MG tablet Take 1 tablet (40 mg total) by mouth daily. 90 tablet 3  . hydrochlorothiazide (HYDRODIURIL) 25 MG tablet Take 25 mg by mouth daily.    . Insulin Pen Needle 31G X 8 MM MISC Use to inject insulin 4 time per day 150 each 2  . Lorcaserin HCl (BELVIQ) 10 MG TABS Take by mouth 2 (two) times daily.    . Multiple Vitamin (MULTIVITAMIN) tablet Take 1 tablet by mouth daily.      . Probiotic Product (PROBIOTIC DAILY PO) Take by mouth.    . simvastatin (ZOCOR) 40 MG tablet Take 1 tablet (40 mg total) by mouth daily. 90 tablet 3  . tamsulosin (FLOMAX) 0.4 MG CAPS capsule Take 1 capsule (0.4 mg total) by mouth daily. 90 capsule 3    No current facility-administered medications on file prior to visit.     No Known Allergies  Family History  Problem Relation Age of Onset  . Crohn's disease Father   . Colon cancer Neg Hx     BP 138/82   Pulse 92   Wt (!) 404 lb 12.8 oz (183.6 kg)   SpO2 95%   BMI 50.60 kg/m    Review of Systems he denies hypoglycemia    Objective:   Physical Exam VITAL SIGNS:  See vs page GENERAL: no distress Pulses: dorsalis pedis intact bilat. MSK: no deformity of the feet.  CV: 2+ bilat leg edema.  Skin: no ulcer on the feet. normal color and temp on the feet. there is hyperpigmentation at both anterior tibial areas.  Neuro: sensation is intact to touch on the feet, but decreased from normal.  Ext: There is bilateral onychomycosis of the toenails.    Lab Results  Component Value Date   HGBA1C 7.2 11/23/2016      Assessment & Plan:  Insulin-requiring type 2 DM: The pattern of his cbg's indicates he needs some adjustment in his therapy, but he declines. Dyslipidemia: recheck today. Prostatism: check PSA today   Subjective:   Patient here for Medicare annual wellness visit and management of other chronic and acute problems.     Risk factors: advanced age    49 of Physicians Providing Medical Care to Patient:  See "snapshot"   Activities of Daily Living: In your present state of health, do you have any difficulty performing the following activities (lives with wife)?:  Preparing food and eating?: yes   Bathing yourself: No  Getting dressed: yes Using the toilet: No  Moving around from place to place: uses walker  In the past year have you fallen or had a near fall?: No    Home Safety: Has smoke detector and wears seat belts. No firearms. No excess sun exposure.   Opioid Use: none   Diet and Exercise  Current exercise habits: limited by health probs Dietary issues discussed: healthy diet   Depression Screen  Q1: Over the past two weeks, have you  felt down, depressed or hopeless?no  Q2: Over the past two weeks, have you felt little interest or pleasure in doing things? no   The following portions of the patient's history were reviewed and updated as appropriate: allergies, current medications, past family history, past medical history, past social history, past surgical history and problem list.   Review of Systems  Denies hearing loss, and visual loss  Objective:   Vision:  Sees opthalmologist, so he declines VA today.  Hearing: grossly normal Body mass index:  See vs page Msk: pt easily and quickly performs "get-up-and-go" from a sitting position.  Cognitive Impairment Assessment: cognition, memory and judgment appear normal.  remembers 3/3 at 5 minutes.  excellent recall.  can easily read and write a sentence.  alert and oriented x 3.    Assessment:   Medicare wellness utd on preventive parameters    Plan:   During the course of the visit the patient was educated and counseled about appropriate screening and preventive services including:        Fall prevention is advised today.   Diabetes screening  Nutrition counseling is offered   Vaccines are updated as needed  Patient Instructions (the written plan) was given to the patient.

## 2016-11-23 NOTE — Progress Notes (Signed)
we discussed code status.  pt requests full code. 

## 2016-11-28 LAB — PTH, INTACT AND CALCIUM
Calcium: 9.3 mg/dL (ref 8.6–10.3)
PTH: 59 pg/mL (ref 14–64)

## 2016-12-26 DIAGNOSIS — M25562 Pain in left knee: Secondary | ICD-10-CM | POA: Diagnosis not present

## 2016-12-26 DIAGNOSIS — S83412A Sprain of medial collateral ligament of left knee, initial encounter: Secondary | ICD-10-CM | POA: Diagnosis not present

## 2017-01-11 DIAGNOSIS — E1142 Type 2 diabetes mellitus with diabetic polyneuropathy: Secondary | ICD-10-CM | POA: Diagnosis not present

## 2017-01-11 DIAGNOSIS — M79672 Pain in left foot: Secondary | ICD-10-CM | POA: Diagnosis not present

## 2017-01-11 DIAGNOSIS — I878 Other specified disorders of veins: Secondary | ICD-10-CM | POA: Diagnosis not present

## 2017-01-11 DIAGNOSIS — B353 Tinea pedis: Secondary | ICD-10-CM | POA: Diagnosis not present

## 2017-01-11 DIAGNOSIS — B351 Tinea unguium: Secondary | ICD-10-CM | POA: Diagnosis not present

## 2017-01-11 DIAGNOSIS — M79671 Pain in right foot: Secondary | ICD-10-CM | POA: Diagnosis not present

## 2017-02-21 ENCOUNTER — Other Ambulatory Visit: Payer: Self-pay

## 2017-02-26 ENCOUNTER — Telehealth: Payer: Self-pay | Admitting: Endocrinology

## 2017-02-26 NOTE — Telephone Encounter (Signed)
simvastatin (ZOCOR) 40 MG tablet [[id number]]  allopurinol (ZYLOPRIM) 300 MG tablet [[id number] furosemide (LASIX) 40 MG tablet [[id number]]  tamsulosin (FLOMAX) 0.4 MG CAPS capsule [[id number]]   90 day supply  [facility] [address] - [place], [place] - [address], [address] [phone] (Phone) [phone] (Fax)

## 2017-02-28 DIAGNOSIS — H02831 Dermatochalasis of right upper eyelid: Secondary | ICD-10-CM | POA: Diagnosis not present

## 2017-02-28 DIAGNOSIS — E119 Type 2 diabetes mellitus without complications: Secondary | ICD-10-CM | POA: Diagnosis not present

## 2017-02-28 DIAGNOSIS — H25813 Combined forms of age-related cataract, bilateral: Secondary | ICD-10-CM | POA: Diagnosis not present

## 2017-02-28 DIAGNOSIS — H02834 Dermatochalasis of left upper eyelid: Secondary | ICD-10-CM | POA: Diagnosis not present

## 2017-02-28 DIAGNOSIS — H527 Unspecified disorder of refraction: Secondary | ICD-10-CM | POA: Diagnosis not present

## 2017-02-28 DIAGNOSIS — H43813 Vitreous degeneration, bilateral: Secondary | ICD-10-CM | POA: Diagnosis not present

## 2017-03-05 ENCOUNTER — Other Ambulatory Visit: Payer: Self-pay

## 2017-03-05 MED ORDER — ALLOPURINOL 300 MG PO TABS: 300.0000 mg | ORAL_TABLET | Freq: Every day | ORAL | 1 refills | Status: DC

## 2017-03-05 MED ORDER — SIMVASTATIN 40 MG PO TABS: 40.0000 mg | ORAL_TABLET | Freq: Every day | ORAL | 3 refills | Status: AC

## 2017-03-05 MED ORDER — TAMSULOSIN HCL 0.4 MG PO CAPS: 0.4000 mg | ORAL_CAPSULE | Freq: Every day | ORAL | 3 refills | Status: AC

## 2017-03-05 MED ORDER — FUROSEMIDE 40 MG PO TABS: 40.0000 mg | ORAL_TABLET | Freq: Every day | ORAL | 3 refills | Status: DC

## 2017-03-05 NOTE — Telephone Encounter (Signed)
simvastatin (ZOCOR) 40 MG tablet  allopurinol (ZYLOPRIM) 300 MG tablet  furosemide (LASIX) 40 MG tablet  tamsulosin (FLOMAX) 0.4 MG CAPS capsule  90 day supply   Sent over to  [place], [place] - [address]

## 2017-03-05 NOTE — Telephone Encounter (Signed)
Done

## 2017-03-18 ENCOUNTER — Other Ambulatory Visit: Payer: Self-pay | Admitting: Endocrinology

## 2017-03-19 DIAGNOSIS — E119 Type 2 diabetes mellitus without complications: Secondary | ICD-10-CM | POA: Diagnosis not present

## 2017-03-19 DIAGNOSIS — H25813 Combined forms of age-related cataract, bilateral: Secondary | ICD-10-CM | POA: Diagnosis not present

## 2017-03-26 ENCOUNTER — Encounter: Payer: Self-pay | Admitting: Endocrinology

## 2017-03-26 ENCOUNTER — Ambulatory Visit (INDEPENDENT_AMBULATORY_CARE_PROVIDER_SITE_OTHER): Payer: Medicare Other | Admitting: Endocrinology

## 2017-03-26 VITALS — BP 148/88 | HR 65 | Ht 75.0 in | Wt >= 6400 oz

## 2017-03-26 DIAGNOSIS — E1042 Type 1 diabetes mellitus with diabetic polyneuropathy: Secondary | ICD-10-CM

## 2017-03-26 LAB — POCT GLYCOSYLATED HEMOGLOBIN (HGB A1C): HEMOGLOBIN A1C: 8.5

## 2017-03-26 MED ORDER — INSULIN REGULAR HUMAN 100 UNIT/ML IJ SOLN: 80.0000 [IU] | Freq: Three times a day (TID) | INTRAMUSCULAR | 11 refills | Status: DC

## 2017-03-26 MED ORDER — FLUCONAZOLE 150 MG PO TABS: 150.0000 mg | ORAL_TABLET | Freq: Every day | ORAL | 2 refills | Status: DC

## 2017-03-26 MED ORDER — INSULIN NPH (HUMAN) (ISOPHANE) 100 UNIT/ML ~~LOC~~ SUSP: 80.0000 [IU] | Freq: Every day | SUBCUTANEOUS | 11 refills | Status: DC

## 2017-03-26 NOTE — Patient Instructions (Signed)
Please increase the insulin back up. check your blood sugar twice a day.  vary the time of day when you check, between before the 3 meals, and at bedtime.  also check if you have symptoms of your blood sugar being too high or too low.  please keep a record of the readings and bring it to your next appointment here (or you can bring the meter itself).  You can write it on any piece of paper.  please call us sooner if your blood sugar goes below 70, or if you have a lot of readings over 200.   I have sent a prescription to your pharmacy, for the foot rash.  Due to an interaction, skip the simvastatin on days you take this.  Please come back for a follow-up appointment in 3 months.

## 2017-03-26 NOTE — Progress Notes (Signed)
Subjective:    Patient ID: [name], male    DOB: [birth year], 77 y.o.   MRN: [id number]  HPI Pt returns for f/u of diabetes mellitus:  DM type: insulin-requiring type 2. Dx'ed: 4481 Complications: CAD, renal insuff, foot ulcer, and peripheral sensory neuropathy.  Therapy: insulin since 2004.   DKA: never.   Severe hypoglycemia: never.   Pancreatitis: never.   Other: he takes multiple daily injections. He declines weight-loss surgery; he takes human insulin, due to cost.  Interval history:  He takes less than the prescribed dosage of insulin.  no cbg record, but states cbg's are in the mid-100's.    Past Medical History:  Diagnosis Date  . Anemia, unspecified   . Arthritis   . Colitis 1997   nonspecific  . Diabetes mellitus    Type I  . DM peripheral angiopathy ([place])   . DM retinopathy ([place])   . ED (erectile dysfunction)   . Hyperlipidemia   . Multinodular goiter   . NASH (nonalcoholic steatohepatitis)   . Osteoarthritis   . Sleep apnea   . Tubular adenoma of colon     Past Surgical History:  Procedure Laterality Date  . APPENDECTOMY    . COLONOSCOPY WITH PROPOFOL N/A 03/23/2014   Procedure: COLONOSCOPY WITH PROPOFOL;  Surgeon: Ladene Artist, MD;  Location: [facility];  Service: Endoscopy;  Laterality: N/A;  . CORONARY ARTERY BYPASS GRAFT  2007   x 1  . ESOPHAGOGASTRODUODENOSCOPY  02/04/2003   normal  . ESOPHAGOGASTRODUODENOSCOPY (EGD) WITH PROPOFOL N/A 03/23/2014   Procedure: ESOPHAGOGASTRODUODENOSCOPY (EGD) WITH PROPOFOL;  Surgeon: Ladene Artist, MD;  Location: [facility];  Service: Endoscopy;  Laterality: N/A;  . THORACOTOMY  2005   S/P Right Throactomy for Empyema    Social History   Socioeconomic History  . Marital status: Married    Spouse name: Not on file  . Number of children: Not on file  . Years of education: Not on file  . Highest education level: Not on file  Social Needs  . Financial resource strain: Not on file  . Food insecurity  - worry: Not on file  . Food insecurity - inability: Not on file  . Transportation needs - medical: Not on file  . Transportation needs - non-medical: Not on file  Occupational History  . Occupation: Retired    Comment: laid off in 2010  Tobacco Use  . Smoking status: Former Smoker    Types: Cigarettes    Last attempt to quit: 03/21/1993    Years since quitting: 24.0  . Smokeless tobacco: Never Used  Substance and Sexual Activity  . Alcohol use: Yes    Comment: rare  . Drug use: No  . Sexual activity: Not on file  Other Topics Concern  . Not on file  Social History Narrative   Regular exercise-swims once day   Daily Caffeine Use: 1-2 cups per day    Current Outpatient Medications on File Prior to Visit  Medication Sig Dispense Refill  . allopurinol (ZYLOPRIM) 300 MG tablet Take 1 tablet (300 mg total) by mouth daily. 90 tablet 1  . aspirin (ASPIRIN ADULT LOW STRENGTH) 81 MG EC tablet Take 81 mg by mouth once.     . cholecalciferol (VITAMIN D) 1000 UNITS tablet Take 2,000 Units by mouth daily.     . Cholecalciferol (VITAMIN D3) 5000 units CAPS Take by mouth.    . clotrimazole-betamethasone (LOTRISONE) cream Apply topically 2 (two) times daily. 90 g 3  .  desonide (DESOWEN) 0.05 % ointment Apply 1 application topically daily as needed (skin irrtation).     Marland Kitchen econazole nitrate 1 % cream Apply 1 application topically daily as needed (skin irritation).     . ferrous sulfate 325 (65 FE) MG tablet Take 325 mg by mouth every other day.     . fluocinonide (LIDEX) 0.05 % external solution Apply 1 application topically daily as needed (Skin irritation).     . furosemide (LASIX) 40 MG tablet TAKE 1 TABLET (40 MG TOTAL) BY MOUTH DAILY. 90 tablet 3  . Insulin Pen Needle 31G X 8 MM MISC Use to inject insulin 4 time per day 150 each 2  . Multiple Vitamin (MULTIVITAMIN) tablet Take 1 tablet by mouth daily.      . simvastatin (ZOCOR) 40 MG tablet Take 1 tablet (40 mg total) by mouth daily. 90  tablet 3  . tamsulosin (FLOMAX) 0.4 MG CAPS capsule Take 1 capsule (0.4 mg total) by mouth daily. 90 capsule 3  . hydrochlorothiazide (HYDRODIURIL) 25 MG tablet Take 25 mg by mouth daily.    . Lorcaserin HCl (BELVIQ) 10 MG TABS Take by mouth 2 (two) times daily.    . Probiotic Product (PROBIOTIC DAILY PO) Take by mouth.     No current facility-administered medications on file prior to visit.     No Known Allergies  Family History  Problem Relation Age of Onset  . Crohn's disease Father   . Colon cancer Neg Hx     BP (!) 148/88   Pulse 65   Ht 6\' 3"  (1.905 m)   Wt (!) 404 lb 3.2 oz (183.3 kg)   SpO2 97%   BMI 50.52 kg/m    Review of Systems He denies hypoglycemia.     Objective:   Physical Exam VITAL SIGNS:  See vs page GENERAL: no distress Pulses: dorsalis pedis intact bilat. MSK: no deformity of the feet.  CV: 2+ bilat leg edema.  Skin: no ulcer on the feet. normal color and temp on the feet. there is hyperpigmentation at both anterior tibial areas. red rash at the medial aspect of the left foot.  Neuro: sensation is intact to touch on the feet, but decreased from normal.  Ext: There is bilateral onychomycosis of the toenails.   Lab Results  Component Value Date   HGBA1C 8.5 03/26/2017   Lab Results  Component Value Date   CREATININE 1.18 11/23/2016   BUN 26 (H) 11/23/2016   NA 142 11/23/2016   K 4.4 11/23/2016   CL 103 11/23/2016   CO2 31 11/23/2016       Assessment & Plan:  Insulin-requiring type 2 DM: worse.  Tinea pedis, new.  Dyslipidemia: there is an interaction between zocor and fluconazole HTN: recheck next time.  Patient Instructions  Please increase the insulin back up. check your blood sugar twice a day.  vary the time of day when you check, between before the 3 meals, and at bedtime.  also check if you have symptoms of your blood sugar being too high or too low.  please keep a record of the readings and bring it to your next  appointment here (or you can bring the meter itself).  You can write it on any piece of paper.  please call us sooner if your blood sugar goes below 70, or if you have a lot of readings over 200.   I have sent a prescription to your pharmacy, for the foot rash.  Due to  an interaction, skip the simvastatin on days you take this.  Please come back for a follow-up appointment in 3 months.

## 2017-03-29 ENCOUNTER — Telehealth: Payer: Self-pay | Admitting: *Deleted

## 2017-03-29 DIAGNOSIS — E785 Hyperlipidemia, unspecified: Secondary | ICD-10-CM | POA: Diagnosis not present

## 2017-03-29 DIAGNOSIS — I2510 Atherosclerotic heart disease of native coronary artery without angina pectoris: Secondary | ICD-10-CM | POA: Diagnosis not present

## 2017-03-29 DIAGNOSIS — H02834 Dermatochalasis of left upper eyelid: Secondary | ICD-10-CM | POA: Diagnosis not present

## 2017-03-29 DIAGNOSIS — M109 Gout, unspecified: Secondary | ICD-10-CM | POA: Diagnosis not present

## 2017-03-29 DIAGNOSIS — Z961 Presence of intraocular lens: Secondary | ICD-10-CM | POA: Diagnosis not present

## 2017-03-29 DIAGNOSIS — H527 Unspecified disorder of refraction: Secondary | ICD-10-CM | POA: Diagnosis not present

## 2017-03-29 DIAGNOSIS — E119 Type 2 diabetes mellitus without complications: Secondary | ICD-10-CM | POA: Diagnosis not present

## 2017-03-29 DIAGNOSIS — Z87891 Personal history of nicotine dependence: Secondary | ICD-10-CM | POA: Diagnosis not present

## 2017-03-29 DIAGNOSIS — G4733 Obstructive sleep apnea (adult) (pediatric): Secondary | ICD-10-CM | POA: Diagnosis not present

## 2017-03-29 DIAGNOSIS — Z7982 Long term (current) use of aspirin: Secondary | ICD-10-CM | POA: Diagnosis not present

## 2017-03-29 DIAGNOSIS — H43813 Vitreous degeneration, bilateral: Secondary | ICD-10-CM | POA: Diagnosis not present

## 2017-03-29 DIAGNOSIS — Z951 Presence of aortocoronary bypass graft: Secondary | ICD-10-CM | POA: Diagnosis not present

## 2017-03-29 DIAGNOSIS — H02831 Dermatochalasis of right upper eyelid: Secondary | ICD-10-CM | POA: Diagnosis not present

## 2017-03-29 DIAGNOSIS — H25812 Combined forms of age-related cataract, left eye: Secondary | ICD-10-CM | POA: Diagnosis not present

## 2017-03-29 DIAGNOSIS — Z794 Long term (current) use of insulin: Secondary | ICD-10-CM | POA: Diagnosis not present

## 2017-03-29 DIAGNOSIS — H2181 Floppy iris syndrome: Secondary | ICD-10-CM | POA: Diagnosis not present

## 2017-03-29 DIAGNOSIS — H25811 Combined forms of age-related cataract, right eye: Secondary | ICD-10-CM | POA: Diagnosis not present

## 2017-03-29 NOTE — Telephone Encounter (Signed)
Received fax stating Humulin is not covered. They are requesting new Rxs for Novolin R and Novolin.  Last OV 03/26/17 Next OV 06/26/17  Please advise.

## 2017-03-29 NOTE — Telephone Encounter (Signed)
novolin is ok

## 2017-03-30 MED ORDER — INSULIN NPH (HUMAN) (ISOPHANE) 100 UNIT/ML ~~LOC~~ SUSP: 80.0000 [IU] | Freq: Every day | SUBCUTANEOUS | 11 refills | Status: DC

## 2017-03-30 NOTE — Telephone Encounter (Signed)
New rx sent. See meds.

## 2017-04-02 ENCOUNTER — Telehealth: Payer: Self-pay | Admitting: Endocrinology

## 2017-04-02 ENCOUNTER — Other Ambulatory Visit: Payer: Self-pay

## 2017-04-02 MED ORDER — INSULIN NPH (HUMAN) (ISOPHANE) 100 UNIT/ML ~~LOC~~ SUSP: SUBCUTANEOUS | 4 refills | Status: DC

## 2017-04-02 MED ORDER — INSULIN REGULAR HUMAN 100 UNIT/ML IJ SOLN: INTRAMUSCULAR | 3 refills | Status: DC

## 2017-04-02 NOTE — Telephone Encounter (Signed)
[facility] calling re: Novalin R insulin script-the directions they have is: inject 80 units in the skin 3 x per day. Patient called them and stated that he is using higher units. Please send new script with new directions or call ph# [phone] to clarify.

## 2017-04-02 NOTE — Telephone Encounter (Signed)
Patient needs new RX for Novalin N 10 milileter bottles (4 bottles) based on 110 units per day for 30 day supply; AND Novalin R 10 vials based on 110 units (3 x per day for 30 day supply) sent to [facility], [address], [place], [place] [address], ph# [phone], ask for Arbie Cookey. [facility] will not honor script sent earlier (for several reasons)-patient is switching to the above pharmacy immediately. Patient needs RX'[s sent asap-out of insulin completely.

## 2017-04-03 ENCOUNTER — Other Ambulatory Visit: Payer: Self-pay

## 2017-04-03 DIAGNOSIS — L219 Seborrheic dermatitis, unspecified: Secondary | ICD-10-CM | POA: Diagnosis not present

## 2017-04-03 DIAGNOSIS — L820 Inflamed seborrheic keratosis: Secondary | ICD-10-CM | POA: Diagnosis not present

## 2017-04-03 MED ORDER — INSULIN REGULAR HUMAN 100 UNIT/ML IJ SOLN: INTRAMUSCULAR | 3 refills | Status: DC

## 2017-04-03 MED ORDER — INSULIN NPH (HUMAN) (ISOPHANE) 100 UNIT/ML ~~LOC~~ SUSP: SUBCUTANEOUS | 4 refills | Status: DC

## 2017-04-03 NOTE — Telephone Encounter (Signed)
I also called & left message that I have sent prescriptions to pharmacy for patient.

## 2017-04-03 NOTE — Telephone Encounter (Signed)
Patient switching pharmacies & I have sent to [facility] per patient request.

## 2017-04-03 NOTE — Telephone Encounter (Signed)
I have sent to [facility].

## 2017-04-05 DIAGNOSIS — I2510 Atherosclerotic heart disease of native coronary artery without angina pectoris: Secondary | ICD-10-CM | POA: Diagnosis not present

## 2017-04-05 DIAGNOSIS — H25812 Combined forms of age-related cataract, left eye: Secondary | ICD-10-CM | POA: Diagnosis not present

## 2017-04-05 DIAGNOSIS — I10 Essential (primary) hypertension: Secondary | ICD-10-CM | POA: Diagnosis not present

## 2017-04-05 DIAGNOSIS — N401 Enlarged prostate with lower urinary tract symptoms: Secondary | ICD-10-CM | POA: Diagnosis not present

## 2017-04-05 DIAGNOSIS — H25813 Combined forms of age-related cataract, bilateral: Secondary | ICD-10-CM | POA: Diagnosis not present

## 2017-04-05 DIAGNOSIS — H527 Unspecified disorder of refraction: Secondary | ICD-10-CM | POA: Diagnosis not present

## 2017-04-05 DIAGNOSIS — H43813 Vitreous degeneration, bilateral: Secondary | ICD-10-CM | POA: Diagnosis not present

## 2017-04-05 DIAGNOSIS — E785 Hyperlipidemia, unspecified: Secondary | ICD-10-CM | POA: Diagnosis not present

## 2017-04-05 DIAGNOSIS — Z87891 Personal history of nicotine dependence: Secondary | ICD-10-CM | POA: Diagnosis not present

## 2017-04-05 DIAGNOSIS — Z951 Presence of aortocoronary bypass graft: Secondary | ICD-10-CM | POA: Diagnosis not present

## 2017-04-05 DIAGNOSIS — G4733 Obstructive sleep apnea (adult) (pediatric): Secondary | ICD-10-CM | POA: Diagnosis not present

## 2017-04-05 DIAGNOSIS — E1140 Type 2 diabetes mellitus with diabetic neuropathy, unspecified: Secondary | ICD-10-CM | POA: Diagnosis not present

## 2017-04-06 DIAGNOSIS — E119 Type 2 diabetes mellitus without complications: Secondary | ICD-10-CM | POA: Diagnosis not present

## 2017-04-06 DIAGNOSIS — H02834 Dermatochalasis of left upper eyelid: Secondary | ICD-10-CM | POA: Diagnosis not present

## 2017-04-06 DIAGNOSIS — H527 Unspecified disorder of refraction: Secondary | ICD-10-CM | POA: Diagnosis not present

## 2017-04-06 DIAGNOSIS — H02831 Dermatochalasis of right upper eyelid: Secondary | ICD-10-CM | POA: Diagnosis not present

## 2017-04-06 DIAGNOSIS — Z961 Presence of intraocular lens: Secondary | ICD-10-CM | POA: Diagnosis not present

## 2017-04-06 DIAGNOSIS — H43813 Vitreous degeneration, bilateral: Secondary | ICD-10-CM | POA: Diagnosis not present

## 2017-04-12 ENCOUNTER — Other Ambulatory Visit: Payer: Self-pay

## 2017-04-26 ENCOUNTER — Other Ambulatory Visit: Payer: Self-pay | Admitting: Endocrinology

## 2017-04-27 DIAGNOSIS — H25812 Combined forms of age-related cataract, left eye: Secondary | ICD-10-CM | POA: Diagnosis not present

## 2017-04-27 DIAGNOSIS — E119 Type 2 diabetes mellitus without complications: Secondary | ICD-10-CM | POA: Diagnosis not present

## 2017-04-27 DIAGNOSIS — Z961 Presence of intraocular lens: Secondary | ICD-10-CM | POA: Diagnosis not present

## 2017-05-08 DIAGNOSIS — L820 Inflamed seborrheic keratosis: Secondary | ICD-10-CM | POA: Diagnosis not present

## 2017-05-21 DIAGNOSIS — B351 Tinea unguium: Secondary | ICD-10-CM | POA: Diagnosis not present

## 2017-05-21 DIAGNOSIS — E1151 Type 2 diabetes mellitus with diabetic peripheral angiopathy without gangrene: Secondary | ICD-10-CM | POA: Diagnosis not present

## 2017-05-21 DIAGNOSIS — B353 Tinea pedis: Secondary | ICD-10-CM | POA: Diagnosis not present

## 2017-05-21 DIAGNOSIS — E1142 Type 2 diabetes mellitus with diabetic polyneuropathy: Secondary | ICD-10-CM | POA: Diagnosis not present

## 2017-05-24 DIAGNOSIS — Z961 Presence of intraocular lens: Secondary | ICD-10-CM | POA: Diagnosis not present

## 2017-06-20 DIAGNOSIS — Z9989 Dependence on other enabling machines and devices: Secondary | ICD-10-CM | POA: Diagnosis not present

## 2017-06-20 DIAGNOSIS — G4733 Obstructive sleep apnea (adult) (pediatric): Secondary | ICD-10-CM | POA: Diagnosis not present

## 2017-06-20 DIAGNOSIS — R0602 Shortness of breath: Secondary | ICD-10-CM | POA: Diagnosis not present

## 2017-06-20 DIAGNOSIS — J984 Other disorders of lung: Secondary | ICD-10-CM | POA: Diagnosis not present

## 2017-06-26 ENCOUNTER — Ambulatory Visit (INDEPENDENT_AMBULATORY_CARE_PROVIDER_SITE_OTHER): Payer: Medicare Other | Admitting: Endocrinology

## 2017-06-26 ENCOUNTER — Encounter: Payer: Self-pay | Admitting: Endocrinology

## 2017-06-26 VITALS — BP 142/68 | HR 113 | Wt >= 6400 oz

## 2017-06-26 DIAGNOSIS — E1159 Type 2 diabetes mellitus with other circulatory complications: Secondary | ICD-10-CM | POA: Diagnosis not present

## 2017-06-26 DIAGNOSIS — Z794 Long term (current) use of insulin: Secondary | ICD-10-CM

## 2017-06-26 LAB — POCT GLYCOSYLATED HEMOGLOBIN (HGB A1C): HEMOGLOBIN A1C: 7.7 % — AB (ref 4.0–5.6)

## 2017-06-26 NOTE — Progress Notes (Signed)
Subjective:    Patient ID: [name], male    DOB: January 20, 1941, 77 y.o.   MRN: [id number]  HPI Pt returns for f/u of diabetes mellitus:  DM type: insulin-requiring type 2. Dx'ed: 5053 Complications: CAD, renal insuff, foot ulcer, and peripheral sensory neuropathy.  Therapy: insulin since 2004.   DKA: never.   Severe hypoglycemia: never.   Pancreatitis: never.   Other: he takes multiple daily injections. He declines weight-loss surgery; he takes human insulin, due to cost.   Interval history:  He takes insulin as rx'ed.  He seldom has hypoglycemia, and these episodes are mild.  He says cbg's are well-controlled.   Past Medical History:  Diagnosis Date  . Anemia, unspecified   . Arthritis   . Colitis 1997   nonspecific  . Diabetes mellitus    Type I  . DM peripheral angiopathy ([place])   . DM retinopathy ([place])   . ED (erectile dysfunction)   . Hyperlipidemia   . Multinodular goiter   . NASH (nonalcoholic steatohepatitis)   . Osteoarthritis   . Sleep apnea   . Tubular adenoma of colon     Past Surgical History:  Procedure Laterality Date  . APPENDECTOMY    . COLONOSCOPY WITH PROPOFOL N/A 03/23/2014   Procedure: COLONOSCOPY WITH PROPOFOL;  Surgeon: Ladene Artist, MD;  Location: [facility];  Service: Endoscopy;  Laterality: N/A;  . CORONARY ARTERY BYPASS GRAFT  2007   x 1  . ESOPHAGOGASTRODUODENOSCOPY  02/04/2003   normal  . ESOPHAGOGASTRODUODENOSCOPY (EGD) WITH PROPOFOL N/A 03/23/2014   Procedure: ESOPHAGOGASTRODUODENOSCOPY (EGD) WITH PROPOFOL;  Surgeon: Ladene Artist, MD;  Location: [facility];  Service: Endoscopy;  Laterality: N/A;  . THORACOTOMY  2005   S/P Right Throactomy for Empyema    Social History   Socioeconomic History  . Marital status: Married    Spouse name: Not on file  . Number of children: Not on file  . Years of education: Not on file  . Highest education level: Not on file  Occupational History  . Occupation: Retired    Comment: laid  off in 2010  Social Needs  . Financial resource strain: Not on file  . Food insecurity:    Worry: Not on file    Inability: Not on file  . Transportation needs:    Medical: Not on file    Non-medical: Not on file  Tobacco Use  . Smoking status: Former Smoker    Types: Cigarettes    Last attempt to quit: 03/21/1993    Years since quitting: 24.2  . Smokeless tobacco: Never Used  Substance and Sexual Activity  . Alcohol use: Yes    Comment: rare  . Drug use: No  . Sexual activity: Not on file  Lifestyle  . Physical activity:    Days per week: Not on file    Minutes per session: Not on file  . Stress: Not on file  Relationships  . Social connections:    Talks on phone: Not on file    Gets together: Not on file    Attends religious service: Not on file    Active member of club or organization: Not on file    Attends meetings of clubs or organizations: Not on file    Relationship status: Not on file  . Intimate partner violence:    Fear of current or ex partner: Not on file    Emotionally abused: Not on file    Physically abused: Not on file  Forced sexual activity: Not on file  Other Topics Concern  . Not on file  Social History Narrative   Regular exercise-swims once day   Daily Caffeine Use: 1-2 cups per day    Current Outpatient Medications on File Prior to Visit  Medication Sig Dispense Refill  . allopurinol (ZYLOPRIM) 300 MG tablet Take 1 tablet (300 mg total) by mouth daily. 90 tablet 1  . aspirin (ASPIRIN ADULT LOW STRENGTH) 81 MG EC tablet Take 81 mg by mouth once.     . cholecalciferol (VITAMIN D) 1000 UNITS tablet Take 5,000 Units by mouth daily.     . Cholecalciferol (VITAMIN D3) 5000 units CAPS Take by mouth.    . clotrimazole-betamethasone (LOTRISONE) cream Apply topically 2 (two) times daily. 90 g 3  . desonide (DESOWEN) 0.05 % ointment Apply 1 application topically daily as needed (skin irrtation).     Marland Kitchen econazole nitrate 1 % cream Apply 1 application  topically daily as needed (skin irritation).     . ferrous sulfate 325 (65 FE) MG tablet Take 325 mg by mouth every other day.     . fluconazole (DIFLUCAN) 150 MG tablet Take 1 tablet (150 mg total) by mouth daily. (Patient not taking: Reported on 06/26/2017) 3 tablet 2  . fluocinonide (LIDEX) 0.05 % external solution Apply 1 application topically daily as needed (Skin irritation).     . furosemide (LASIX) 40 MG tablet TAKE 1 TABLET (40 MG TOTAL) BY MOUTH DAILY. 90 tablet 3  . hydrochlorothiazide (HYDRODIURIL) 25 MG tablet Take 25 mg by mouth daily.    . insulin NPH Human (NOVOLIN N) 100 UNIT/ML injection INJECT 110 UNITS IN THE SKIN AT BEDTIME 4 vial 4  . Insulin Pen Needle 31G X 8 MM MISC Use to inject insulin 4 time per day 150 each 2  . insulin regular (NOVOLIN R) 100 units/mL injection INJECT 110 UNITS IN THE SKIN 3 TIMES DAILY BEFORE MEALS 10 vial 3  . Lorcaserin HCl (BELVIQ) 10 MG TABS Take by mouth 2 (two) times daily.    . Multiple Vitamin (MULTIVITAMIN) tablet Take 1 tablet by mouth daily.      . Probiotic Product (PROBIOTIC DAILY PO) Take by mouth.    . simvastatin (ZOCOR) 40 MG tablet Take 1 tablet (40 mg total) by mouth daily. 90 tablet 3  . simvastatin (ZOCOR) 40 MG tablet TAKE 1 TABLET (40 MG TOTAL) BY MOUTH DAILY. (Patient not taking: Reported on 06/26/2017) 90 tablet 2  . tamsulosin (FLOMAX) 0.4 MG CAPS capsule Take 1 capsule (0.4 mg total) by mouth daily. 90 capsule 3   No current facility-administered medications on file prior to visit.     No Known Allergies  Family History  Problem Relation Age of Onset  . Crohn's disease Father   . Colon cancer Neg Hx     BP (!) 142/68   Pulse (!) 113   Wt (!) 405 lb 12.8 oz (184.1 kg)   SpO2 93%   BMI 50.72 kg/m    Review of Systems Denies LOC.      Objective:   Physical Exam VITAL SIGNS:  See vs page.   GENERAL: no distress Pulses: dorsalis pedis intact bilat. MSK: no deformity of the feet.  CV: 1+ bilat leg edema.    Skin: no ulcer on the feet. normal color and temp on the feet. there is hyperpigmentation at both anterior tibial areas (L>R). red rash at the medial aspect of the left foot.  Neuro: sensation is  intact to touch on the feet, but decreased from normal.  Ext: There is bilateral onychomycosis of the toenails.   I personally reviewed electrocardiogram tracing (today): Indication: DM Impression: ST.  No MI.  LAFB Compared to 2016: ST is new  Lab Results  Component Value Date   HGBA1C 7.7 (A) 06/26/2017   Lab Results  Component Value Date   CREATININE 1.18 11/23/2016   BUN 26 (H) 11/23/2016   NA 142 11/23/2016   K 4.4 11/23/2016   CL 103 11/23/2016   CO2 31 11/23/2016       Assessment & Plan:  Insulin-requiring type 2 DM, with CAD.  Renal insuff: he is at risk for hypoglycemia.    Patient Instructions  Please continue the same insulin check your blood sugar twice a day.  vary the time of day when you check, between before the 3 meals, and at bedtime.  also check if you have symptoms of your blood sugar being too high or too low.  please keep a record of the readings and bring it to your next appointment here (or you can bring the meter itself).  You can write it on any piece of paper.  please call us sooner if your blood sugar goes below 70, or if you have a lot of readings over 200.   Please come back for a follow-up appointment in 3-4 months.

## 2017-06-26 NOTE — Patient Instructions (Addendum)
Please continue the same insulin check your blood sugar twice a day.  vary the time of day when you check, between before the 3 meals, and at bedtime.  also check if you have symptoms of your blood sugar being too high or too low.  please keep a record of the readings and bring it to your next appointment here (or you can bring the meter itself).  You can write it on any piece of paper.  please call us sooner if your blood sugar goes below 70, or if you have a lot of readings over 200.   Please come back for a follow-up appointment in 3-4 months.

## 2017-09-04 ENCOUNTER — Other Ambulatory Visit: Payer: Self-pay

## 2017-09-04 MED ORDER — ALLOPURINOL 300 MG PO TABS: 300.0000 mg | ORAL_TABLET | Freq: Every day | ORAL | 1 refills | Status: AC

## 2017-09-05 DIAGNOSIS — L821 Other seborrheic keratosis: Secondary | ICD-10-CM | POA: Diagnosis not present

## 2017-09-05 DIAGNOSIS — L820 Inflamed seborrheic keratosis: Secondary | ICD-10-CM | POA: Diagnosis not present

## 2017-09-05 DIAGNOSIS — L918 Other hypertrophic disorders of the skin: Secondary | ICD-10-CM | POA: Diagnosis not present

## 2017-09-24 DIAGNOSIS — B351 Tinea unguium: Secondary | ICD-10-CM | POA: Diagnosis not present

## 2017-09-24 DIAGNOSIS — E1142 Type 2 diabetes mellitus with diabetic polyneuropathy: Secondary | ICD-10-CM | POA: Diagnosis not present

## 2017-09-26 ENCOUNTER — Encounter: Payer: Self-pay | Admitting: Endocrinology

## 2017-09-26 ENCOUNTER — Ambulatory Visit (INDEPENDENT_AMBULATORY_CARE_PROVIDER_SITE_OTHER): Payer: Medicare Other | Admitting: Endocrinology

## 2017-09-26 VITALS — BP 116/72 | HR 99 | Ht 75.0 in | Wt >= 6400 oz

## 2017-09-26 DIAGNOSIS — Z794 Long term (current) use of insulin: Secondary | ICD-10-CM | POA: Diagnosis not present

## 2017-09-26 DIAGNOSIS — E1159 Type 2 diabetes mellitus with other circulatory complications: Secondary | ICD-10-CM

## 2017-09-26 LAB — POCT GLYCOSYLATED HEMOGLOBIN (HGB A1C): Hemoglobin A1C: 7.7 % — AB (ref 4.0–5.6)

## 2017-09-26 MED ORDER — INSULIN REGULAR HUMAN 100 UNIT/ML IJ SOLN: 85.0000 [IU] | Freq: Three times a day (TID) | INTRAMUSCULAR | 3 refills | Status: DC

## 2017-09-26 MED ORDER — INSULIN NPH (HUMAN) (ISOPHANE) 100 UNIT/ML ~~LOC~~ SUSP: 90.0000 [IU] | Freq: Every day | SUBCUTANEOUS | 4 refills | Status: DC

## 2017-09-26 NOTE — Patient Instructions (Signed)
Please come back for a follow-up appointment in 2 months.  Please take the insulins as listed below.   check your blood sugar 4 times a day: before the 3 meals, and at bedtime.  also check if you have symptoms of your blood sugar being too high or too low.  please keep a record of the readings and bring it to your next appointment here (or you can bring the meter itself).  You can write it on any piece of paper.  please call us sooner if your blood sugar goes below 70, or if you have a lot of readings over 200.

## 2017-09-26 NOTE — Progress Notes (Signed)
Subjective:    Patient ID: [name], male    DOB: Feb 10, 1940, 77 y.o.   MRN: [id number]  HPI Pt returns for f/u of diabetes mellitus:  DM type: insulin-requiring type 2. Dx'ed: 6967 Complications: CAD, renal insuff, foot ulcer, and peripheral sensory neuropathy.  Therapy: insulin since 2004.   DKA: never.   Severe hypoglycemia: never.   Pancreatitis: never.   Other: he takes multiple daily injections. He declines weight-loss surgery; he takes human insulin, due to cost.   Interval history:  He takes insulin as rx'ed.  He seldom has hypoglycemia, and these episodes are mild.  These happen in the middle of the night.  He has reduced NPH to 90 units qhs, and reg, 82 units 3 times a day (just before each meal).   He says cbg's are well-controlled.   Past Medical History:  Diagnosis Date  . Anemia, unspecified   . Arthritis   . Colitis 1997   nonspecific  . Diabetes mellitus    Type I  . DM peripheral angiopathy ([place])   . DM retinopathy ([place])   . ED (erectile dysfunction)   . Hyperlipidemia   . Multinodular goiter   . NASH (nonalcoholic steatohepatitis)   . Osteoarthritis   . Sleep apnea   . Tubular adenoma of colon     Past Surgical History:  Procedure Laterality Date  . APPENDECTOMY    . COLONOSCOPY WITH PROPOFOL N/A 03/23/2014   Procedure: COLONOSCOPY WITH PROPOFOL;  Surgeon: Ladene Artist, MD;  [facility];  Service: Endoscopy;  Laterality: N/A;  . CORONARY ARTERY BYPASS GRAFT  2007   x 1  . ESOPHAGOGASTRODUODENOSCOPY  02/04/2003   normal  . ESOPHAGOGASTRODUODENOSCOPY (EGD) WITH PROPOFOL N/A 03/23/2014   Procedure: ESOPHAGOGASTRODUODENOSCOPY (EGD) WITH PROPOFOL;  Surgeon: Ladene Artist, MD;  [facility];  Service: Endoscopy;  Laterality: N/A;  . THORACOTOMY  2005   S/P Right Throactomy for Empyema    Social History   Socioeconomic History  . Marital status: Married    Spouse name: Not on file  . Number of children: Not on file  .  Years of education: Not on file  . Highest education level: Not on file  Occupational History  . Occupation: Retired    Comment: laid off in 2010  Social Needs  . Financial resource strain: Not on file  . Food insecurity:    Worry: Not on file    Inability: Not on file  . Transportation needs:    Medical: Not on file    Non-medical: Not on file  Tobacco Use  . Smoking status: Former Smoker    Types: Cigarettes    Last attempt to quit: 03/21/1993    Years since quitting: 24.5  . Smokeless tobacco: Never Used  Substance and Sexual Activity  . Alcohol use: Yes    Comment: rare  . Drug use: No  . Sexual activity: Not on file  Lifestyle  . Physical activity:    Days per week: Not on file    Minutes per session: Not on file  . Stress: Not on file  Relationships  . Social connections:    Talks on phone: Not on file    Gets together: Not on file    Attends religious service: Not on file    Active member of club or organization: Not on file    Attends meetings of clubs or organizations: Not on file    Relationship status: Not on file  .  Intimate partner violence:    Fear of current or ex partner: Not on file    Emotionally abused: Not on file    Physically abused: Not on file    Forced sexual activity: Not on file  Other Topics Concern  . Not on file  Social History Narrative   Regular exercise-swims once day   Daily Caffeine Use: 1-2 cups per day    Current Outpatient Medications on File Prior to Visit  Medication Sig Dispense Refill  . allopurinol (ZYLOPRIM) 300 MG tablet Take 1 tablet (300 mg total) by mouth daily. 90 tablet 1  . aspirin (ASPIRIN ADULT LOW STRENGTH) 81 MG EC tablet Take 81 mg by mouth once.     . cholecalciferol (VITAMIN D) 1000 UNITS tablet Take 5,000 Units by mouth daily.     . Cholecalciferol (VITAMIN D3) 5000 units CAPS Take by mouth.    . clotrimazole-betamethasone (LOTRISONE) cream Apply topically 2 (two) times daily. 90 g 3  . desonide  (DESOWEN) 0.05 % ointment Apply 1 application topically daily as needed (skin irrtation).     Marland Kitchen econazole nitrate 1 % cream Apply 1 application topically daily as needed (skin irritation).     . ferrous sulfate 325 (65 FE) MG tablet Take 325 mg by mouth every other day.     . fluocinonide (LIDEX) 0.05 % external solution Apply 1 application topically daily as needed (Skin irritation).     . furosemide (LASIX) 40 MG tablet TAKE 1 TABLET (40 MG TOTAL) BY MOUTH DAILY. 90 tablet 3  . hydrochlorothiazide (HYDRODIURIL) 25 MG tablet Take 25 mg by mouth daily.    . Insulin Pen Needle 31G X 8 MM MISC Use to inject insulin 4 time per day 150 each 2  . Lorcaserin HCl (BELVIQ) 10 MG TABS Take by mouth 2 (two) times daily.    . Multiple Vitamin (MULTIVITAMIN) tablet Take 1 tablet by mouth daily.      . Probiotic Product (PROBIOTIC DAILY PO) Take by mouth.    . simvastatin (ZOCOR) 40 MG tablet Take 1 tablet (40 mg total) by mouth daily. 90 tablet 3  . simvastatin (ZOCOR) 40 MG tablet TAKE 1 TABLET (40 MG TOTAL) BY MOUTH DAILY. 90 tablet 2  . tamsulosin (FLOMAX) 0.4 MG CAPS capsule Take 1 capsule (0.4 mg total) by mouth daily. 90 capsule 3   No current facility-administered medications on file prior to visit.     No Known Allergies  Family History  Problem Relation Age of Onset  . Crohn's disease Father   . Colon cancer Neg Hx     BP 116/72 (BP Location: Right Arm, Patient Position: Sitting, Cuff Size: Normal)   Pulse 99   Ht 6\' 3"  (1.905 m)   Wt (!) 406 lb 3.2 oz (184.3 kg)   SpO2 96%   BMI 50.77 kg/m    Review of Systems Denies LOC.      Objective:   Physical Exam VITAL SIGNS:  See vs page.   GENERAL: no distress Pulses: dorsalis pedis intact bilat. MSK: no deformity of the feet.  CV: 2+ bilat leg edema.   Skin: no ulcer on the feet. normal color and temp on the feet. there is hyperpigmentation at both anterior tibial areas (L>>R). red rash at the medial aspect of the left foot.    Neuro: sensation is intact to touch on the feet, but decreased from normal.  Ext: There is bilateral onychomycosis of the toenails.     Lab Results  Component Value Date   HGBA1C 7.7 (A) 09/26/2017       Assessment & Plan:  Insulin-requiring type 2 DM: this is the best control this pt should aim for, given advanced age Hypoglycemia: The pattern of his cbg's indicates he needs some adjustment in his therapy Renal insuff: in this context, he needs mostly mealtime insulin  Patient Instructions  Please come back for a follow-up appointment in 2 months.  Please take the insulins as listed below.   check your blood sugar 4 times a day: before the 3 meals, and at bedtime.  also check if you have symptoms of your blood sugar being too high or too low.  please keep a record of the readings and bring it to your next appointment here (or you can bring the meter itself).  You can write it on any piece of paper.  please call us sooner if your blood sugar goes below 70, or if you have a lot of readings over 200.

## 2017-10-13 DIAGNOSIS — S199XXA Unspecified injury of neck, initial encounter: Secondary | ICD-10-CM | POA: Diagnosis not present

## 2017-10-13 DIAGNOSIS — R7989 Other specified abnormal findings of blood chemistry: Secondary | ICD-10-CM | POA: Diagnosis not present

## 2017-10-13 DIAGNOSIS — I110 Hypertensive heart disease with heart failure: Secondary | ICD-10-CM | POA: Diagnosis not present

## 2017-10-13 DIAGNOSIS — Z6841 Body Mass Index (BMI) 40.0 and over, adult: Secondary | ICD-10-CM | POA: Diagnosis not present

## 2017-10-13 DIAGNOSIS — I824Z2 Acute embolism and thrombosis of unspecified deep veins of left distal lower extremity: Secondary | ICD-10-CM | POA: Diagnosis not present

## 2017-10-13 DIAGNOSIS — S0990XA Unspecified injury of head, initial encounter: Secondary | ICD-10-CM | POA: Diagnosis not present

## 2017-10-13 DIAGNOSIS — I2510 Atherosclerotic heart disease of native coronary artery without angina pectoris: Secondary | ICD-10-CM | POA: Diagnosis not present

## 2017-10-13 DIAGNOSIS — I2699 Other pulmonary embolism without acute cor pulmonale: Secondary | ICD-10-CM | POA: Diagnosis not present

## 2017-10-13 DIAGNOSIS — I4891 Unspecified atrial fibrillation: Secondary | ICD-10-CM | POA: Diagnosis not present

## 2017-10-13 DIAGNOSIS — J9811 Atelectasis: Secondary | ICD-10-CM | POA: Diagnosis not present

## 2017-10-13 DIAGNOSIS — R55 Syncope and collapse: Secondary | ICD-10-CM | POA: Diagnosis not present

## 2017-10-13 DIAGNOSIS — R531 Weakness: Secondary | ICD-10-CM | POA: Diagnosis not present

## 2017-10-13 DIAGNOSIS — G9389 Other specified disorders of brain: Secondary | ICD-10-CM | POA: Diagnosis not present

## 2017-10-13 DIAGNOSIS — R42 Dizziness and giddiness: Secondary | ICD-10-CM | POA: Diagnosis not present

## 2017-10-13 DIAGNOSIS — R0602 Shortness of breath: Secondary | ICD-10-CM | POA: Diagnosis not present

## 2017-10-13 DIAGNOSIS — E1142 Type 2 diabetes mellitus with diabetic polyneuropathy: Secondary | ICD-10-CM | POA: Diagnosis not present

## 2017-10-13 DIAGNOSIS — R918 Other nonspecific abnormal finding of lung field: Secondary | ICD-10-CM | POA: Diagnosis not present

## 2017-10-13 DIAGNOSIS — R0989 Other specified symptoms and signs involving the circulatory and respiratory systems: Secondary | ICD-10-CM | POA: Diagnosis not present

## 2017-10-13 DIAGNOSIS — R9082 White matter disease, unspecified: Secondary | ICD-10-CM | POA: Diagnosis not present

## 2017-10-13 DIAGNOSIS — I509 Heart failure, unspecified: Secondary | ICD-10-CM | POA: Diagnosis not present

## 2017-10-13 DIAGNOSIS — J9601 Acute respiratory failure with hypoxia: Secondary | ICD-10-CM | POA: Diagnosis not present

## 2017-10-13 DIAGNOSIS — I517 Cardiomegaly: Secondary | ICD-10-CM | POA: Diagnosis not present

## 2017-10-13 DIAGNOSIS — W19XXXA Unspecified fall, initial encounter: Secondary | ICD-10-CM | POA: Diagnosis not present

## 2017-10-14 DIAGNOSIS — R0602 Shortness of breath: Secondary | ICD-10-CM | POA: Diagnosis not present

## 2017-10-14 DIAGNOSIS — Z86010 Personal history of colonic polyps: Secondary | ICD-10-CM | POA: Diagnosis not present

## 2017-10-14 DIAGNOSIS — I82492 Acute embolism and thrombosis of other specified deep vein of left lower extremity: Secondary | ICD-10-CM | POA: Diagnosis not present

## 2017-10-14 DIAGNOSIS — I2510 Atherosclerotic heart disease of native coronary artery without angina pectoris: Secondary | ICD-10-CM | POA: Diagnosis present

## 2017-10-14 DIAGNOSIS — J9601 Acute respiratory failure with hypoxia: Secondary | ICD-10-CM | POA: Diagnosis not present

## 2017-10-14 DIAGNOSIS — R2243 Localized swelling, mass and lump, lower limb, bilateral: Secondary | ICD-10-CM | POA: Diagnosis not present

## 2017-10-14 DIAGNOSIS — Z79899 Other long term (current) drug therapy: Secondary | ICD-10-CM | POA: Diagnosis not present

## 2017-10-14 DIAGNOSIS — J9811 Atelectasis: Secondary | ICD-10-CM | POA: Diagnosis not present

## 2017-10-14 DIAGNOSIS — Z7982 Long term (current) use of aspirin: Secondary | ICD-10-CM | POA: Diagnosis not present

## 2017-10-14 DIAGNOSIS — Z87891 Personal history of nicotine dependence: Secondary | ICD-10-CM | POA: Diagnosis not present

## 2017-10-14 DIAGNOSIS — J9691 Respiratory failure, unspecified with hypoxia: Secondary | ICD-10-CM | POA: Diagnosis not present

## 2017-10-14 DIAGNOSIS — R531 Weakness: Secondary | ICD-10-CM | POA: Diagnosis not present

## 2017-10-14 DIAGNOSIS — I480 Paroxysmal atrial fibrillation: Secondary | ICD-10-CM | POA: Diagnosis present

## 2017-10-14 DIAGNOSIS — J984 Other disorders of lung: Secondary | ICD-10-CM | POA: Diagnosis not present

## 2017-10-14 DIAGNOSIS — Z6841 Body Mass Index (BMI) 40.0 and over, adult: Secondary | ICD-10-CM | POA: Diagnosis not present

## 2017-10-14 DIAGNOSIS — Z7901 Long term (current) use of anticoagulants: Secondary | ICD-10-CM | POA: Diagnosis not present

## 2017-10-14 DIAGNOSIS — Z9989 Dependence on other enabling machines and devices: Secondary | ICD-10-CM | POA: Diagnosis not present

## 2017-10-14 DIAGNOSIS — I10 Essential (primary) hypertension: Secondary | ICD-10-CM | POA: Diagnosis present

## 2017-10-14 DIAGNOSIS — I824Z2 Acute embolism and thrombosis of unspecified deep veins of left distal lower extremity: Secondary | ICD-10-CM | POA: Diagnosis not present

## 2017-10-14 DIAGNOSIS — E1140 Type 2 diabetes mellitus with diabetic neuropathy, unspecified: Secondary | ICD-10-CM | POA: Diagnosis not present

## 2017-10-14 DIAGNOSIS — I519 Heart disease, unspecified: Secondary | ICD-10-CM | POA: Diagnosis not present

## 2017-10-14 DIAGNOSIS — I82409 Acute embolism and thrombosis of unspecified deep veins of unspecified lower extremity: Secondary | ICD-10-CM | POA: Diagnosis not present

## 2017-10-14 DIAGNOSIS — E11649 Type 2 diabetes mellitus with hypoglycemia without coma: Secondary | ICD-10-CM | POA: Diagnosis not present

## 2017-10-14 DIAGNOSIS — I872 Venous insufficiency (chronic) (peripheral): Secondary | ICD-10-CM | POA: Diagnosis not present

## 2017-10-14 DIAGNOSIS — Z794 Long term (current) use of insulin: Secondary | ICD-10-CM | POA: Diagnosis not present

## 2017-10-14 DIAGNOSIS — R55 Syncope and collapse: Secondary | ICD-10-CM | POA: Diagnosis not present

## 2017-10-14 DIAGNOSIS — R748 Abnormal levels of other serum enzymes: Secondary | ICD-10-CM | POA: Diagnosis not present

## 2017-10-14 DIAGNOSIS — D649 Anemia, unspecified: Secondary | ICD-10-CM | POA: Diagnosis not present

## 2017-10-14 DIAGNOSIS — G4733 Obstructive sleep apnea (adult) (pediatric): Secondary | ICD-10-CM | POA: Diagnosis not present

## 2017-10-14 DIAGNOSIS — I2699 Other pulmonary embolism without acute cor pulmonale: Secondary | ICD-10-CM | POA: Diagnosis not present

## 2017-10-14 DIAGNOSIS — I517 Cardiomegaly: Secondary | ICD-10-CM | POA: Diagnosis not present

## 2017-10-14 DIAGNOSIS — E1142 Type 2 diabetes mellitus with diabetic polyneuropathy: Secondary | ICD-10-CM | POA: Diagnosis not present

## 2017-10-18 DIAGNOSIS — I4891 Unspecified atrial fibrillation: Secondary | ICD-10-CM | POA: Diagnosis not present

## 2017-10-18 DIAGNOSIS — J449 Chronic obstructive pulmonary disease, unspecified: Secondary | ICD-10-CM | POA: Diagnosis not present

## 2017-10-18 DIAGNOSIS — I2699 Other pulmonary embolism without acute cor pulmonale: Secondary | ICD-10-CM | POA: Diagnosis not present

## 2017-10-18 DIAGNOSIS — J984 Other disorders of lung: Secondary | ICD-10-CM | POA: Diagnosis not present

## 2017-10-18 DIAGNOSIS — I82409 Acute embolism and thrombosis of unspecified deep veins of unspecified lower extremity: Secondary | ICD-10-CM | POA: Diagnosis not present

## 2017-10-18 DIAGNOSIS — M6281 Muscle weakness (generalized): Secondary | ICD-10-CM | POA: Diagnosis not present

## 2017-10-18 DIAGNOSIS — Z794 Long term (current) use of insulin: Secondary | ICD-10-CM | POA: Diagnosis not present

## 2017-10-18 DIAGNOSIS — E1140 Type 2 diabetes mellitus with diabetic neuropathy, unspecified: Secondary | ICD-10-CM | POA: Diagnosis not present

## 2017-10-18 DIAGNOSIS — I872 Venous insufficiency (chronic) (peripheral): Secondary | ICD-10-CM | POA: Diagnosis not present

## 2017-10-18 DIAGNOSIS — Z6841 Body Mass Index (BMI) 40.0 and over, adult: Secondary | ICD-10-CM | POA: Diagnosis not present

## 2017-10-18 DIAGNOSIS — Z7901 Long term (current) use of anticoagulants: Secondary | ICD-10-CM | POA: Diagnosis not present

## 2017-10-18 DIAGNOSIS — D649 Anemia, unspecified: Secondary | ICD-10-CM | POA: Diagnosis not present

## 2017-10-18 DIAGNOSIS — E119 Type 2 diabetes mellitus without complications: Secondary | ICD-10-CM | POA: Diagnosis not present

## 2017-10-18 DIAGNOSIS — E118 Type 2 diabetes mellitus with unspecified complications: Secondary | ICD-10-CM | POA: Diagnosis not present

## 2017-10-18 DIAGNOSIS — I10 Essential (primary) hypertension: Secondary | ICD-10-CM | POA: Diagnosis not present

## 2017-10-18 DIAGNOSIS — J9601 Acute respiratory failure with hypoxia: Secondary | ICD-10-CM | POA: Diagnosis not present

## 2017-10-18 DIAGNOSIS — K635 Polyp of colon: Secondary | ICD-10-CM | POA: Diagnosis not present

## 2017-10-18 DIAGNOSIS — J9691 Respiratory failure, unspecified with hypoxia: Secondary | ICD-10-CM | POA: Diagnosis not present

## 2017-10-18 DIAGNOSIS — Z7982 Long term (current) use of aspirin: Secondary | ICD-10-CM | POA: Diagnosis not present

## 2017-10-18 DIAGNOSIS — I824Z2 Acute embolism and thrombosis of unspecified deep veins of left distal lower extremity: Secondary | ICD-10-CM | POA: Diagnosis not present

## 2017-10-18 DIAGNOSIS — I2510 Atherosclerotic heart disease of native coronary artery without angina pectoris: Secondary | ICD-10-CM | POA: Diagnosis not present

## 2017-10-18 DIAGNOSIS — G4730 Sleep apnea, unspecified: Secondary | ICD-10-CM | POA: Diagnosis not present

## 2017-10-18 DIAGNOSIS — Z9989 Dependence on other enabling machines and devices: Secondary | ICD-10-CM | POA: Diagnosis not present

## 2017-10-18 DIAGNOSIS — R55 Syncope and collapse: Secondary | ICD-10-CM | POA: Diagnosis not present

## 2017-10-18 DIAGNOSIS — G4733 Obstructive sleep apnea (adult) (pediatric): Secondary | ICD-10-CM | POA: Diagnosis not present

## 2017-10-18 DIAGNOSIS — E1142 Type 2 diabetes mellitus with diabetic polyneuropathy: Secondary | ICD-10-CM | POA: Diagnosis not present

## 2017-10-19 DIAGNOSIS — D649 Anemia, unspecified: Secondary | ICD-10-CM | POA: Diagnosis not present

## 2017-10-19 DIAGNOSIS — K635 Polyp of colon: Secondary | ICD-10-CM | POA: Diagnosis not present

## 2017-10-19 DIAGNOSIS — I2510 Atherosclerotic heart disease of native coronary artery without angina pectoris: Secondary | ICD-10-CM | POA: Diagnosis not present

## 2017-10-19 DIAGNOSIS — Z794 Long term (current) use of insulin: Secondary | ICD-10-CM | POA: Diagnosis not present

## 2017-10-19 DIAGNOSIS — I2699 Other pulmonary embolism without acute cor pulmonale: Secondary | ICD-10-CM | POA: Diagnosis not present

## 2017-10-19 DIAGNOSIS — G4730 Sleep apnea, unspecified: Secondary | ICD-10-CM | POA: Diagnosis not present

## 2017-10-19 DIAGNOSIS — I10 Essential (primary) hypertension: Secondary | ICD-10-CM | POA: Diagnosis not present

## 2017-10-19 DIAGNOSIS — E118 Type 2 diabetes mellitus with unspecified complications: Secondary | ICD-10-CM | POA: Diagnosis not present

## 2017-10-20 MED ORDER — DESONIDE 0.05 % EX OINT
TOPICAL_OINTMENT | CUTANEOUS | Status: DC
Start: ? — End: 2017-10-20

## 2017-10-20 MED ORDER — INSULIN GLARGINE 100 UNIT/ML ~~LOC~~ SOLN
1.00 | SUBCUTANEOUS | Status: DC
Start: 2017-10-18 — End: 2017-10-20

## 2017-10-20 MED ORDER — BENZONATATE 100 MG PO CAPS
100.00 | ORAL_CAPSULE | ORAL | Status: DC
Start: ? — End: 2017-10-20

## 2017-10-20 MED ORDER — GENERIC EXTERNAL MEDICATION
10.00 | Status: DC
Start: ? — End: 2017-10-20

## 2017-10-20 MED ORDER — GENERIC EXTERNAL MEDICATION
650.00 | Status: DC
Start: ? — End: 2017-10-20

## 2017-10-20 MED ORDER — FUROSEMIDE 40 MG PO TABS
40.00 | ORAL_TABLET | ORAL | Status: DC
Start: 2017-10-19 — End: 2017-10-20

## 2017-10-20 MED ORDER — SODIUM CHLORIDE 0.9 % IV SOLN
10.00 | INTRAVENOUS | Status: DC
Start: ? — End: 2017-10-20

## 2017-10-20 MED ORDER — ACETAMINOPHEN 325 MG PO TABS
650.00 | ORAL_TABLET | ORAL | Status: DC
Start: ? — End: 2017-10-20

## 2017-10-20 MED ORDER — INSULIN GLARGINE 100 UNIT/ML ~~LOC~~ SOLN
1.00 | SUBCUTANEOUS | Status: DC
Start: ? — End: 2017-10-20

## 2017-10-20 MED ORDER — ATORVASTATIN CALCIUM 20 MG PO TABS
20.00 | ORAL_TABLET | ORAL | Status: DC
Start: 2017-10-18 — End: 2017-10-20

## 2017-10-20 MED ORDER — ASPIRIN EC 81 MG PO TBEC
81.00 | DELAYED_RELEASE_TABLET | ORAL | Status: DC
Start: 2017-10-19 — End: 2017-10-20

## 2017-10-20 MED ORDER — CLOTRIMAZOLE 1 % EX CREA
TOPICAL_CREAM | CUTANEOUS | Status: DC
Start: 2017-10-19 — End: 2017-10-20

## 2017-10-20 MED ORDER — HYDRALAZINE HCL 20 MG/ML IJ SOLN
10.00 | INTRAMUSCULAR | Status: DC
Start: ? — End: 2017-10-20

## 2017-10-20 MED ORDER — WARFARIN SODIUM 10 MG PO TABS
10.00 | ORAL_TABLET | ORAL | Status: DC
Start: ? — End: 2017-10-20

## 2017-10-20 MED ORDER — NITROGLYCERIN 0.4 MG SL SUBL
0.40 | SUBLINGUAL_TABLET | SUBLINGUAL | Status: DC
Start: ? — End: 2017-10-20

## 2017-10-20 MED ORDER — MUPIROCIN 2 % EX OINT
0.25 | TOPICAL_OINTMENT | CUTANEOUS | Status: DC
Start: 2017-10-18 — End: 2017-10-20

## 2017-10-20 MED ORDER — HYDROCODONE-ACETAMINOPHEN 5-325 MG PO TABS
1.00 | ORAL_TABLET | ORAL | Status: DC
Start: ? — End: 2017-10-20

## 2017-10-20 MED ORDER — GENERIC EXTERNAL MEDICATION
15.00 | Status: DC
Start: ? — End: 2017-10-20

## 2017-10-20 MED ORDER — POLYETHYLENE GLYCOL 3350 17 G PO PACK
17.00 | PACK | ORAL | Status: DC
Start: ? — End: 2017-10-20

## 2017-10-20 MED ORDER — INSULIN LISPRO 100 UNIT/ML ~~LOC~~ SOLN
1.00 | SUBCUTANEOUS | Status: DC
Start: ? — End: 2017-10-20

## 2017-10-20 MED ORDER — WARFARIN SODIUM 1 MG PO TABS
ORAL_TABLET | ORAL | Status: DC
Start: ? — End: 2017-10-20

## 2017-10-20 MED ORDER — TAMSULOSIN HCL 0.4 MG PO CAPS
0.40 | ORAL_CAPSULE | ORAL | Status: DC
Start: 2017-10-19 — End: 2017-10-20

## 2017-10-20 MED ORDER — INSULIN LISPRO 100 UNIT/ML ~~LOC~~ SOLN
1.00 | SUBCUTANEOUS | Status: DC
Start: 2017-10-18 — End: 2017-10-20

## 2017-10-20 MED ORDER — ALLOPURINOL 300 MG PO TABS
300.00 | ORAL_TABLET | ORAL | Status: DC
Start: 2017-10-19 — End: 2017-10-20

## 2017-10-20 MED ORDER — GENERIC EXTERNAL MEDICATION
1.00 | Status: DC
Start: ? — End: 2017-10-20

## 2017-10-20 MED ORDER — ALUM & MAG HYDROXIDE-SIMETH 200-200-20 MG/5ML PO SUSP
30.00 | ORAL | Status: DC
Start: ? — End: 2017-10-20

## 2017-10-20 MED ORDER — GENERIC EXTERNAL MEDICATION
4.00 | Status: DC
Start: ? — End: 2017-10-20

## 2017-10-23 DIAGNOSIS — M6281 Muscle weakness (generalized): Secondary | ICD-10-CM | POA: Diagnosis not present

## 2017-10-23 DIAGNOSIS — I2699 Other pulmonary embolism without acute cor pulmonale: Secondary | ICD-10-CM | POA: Diagnosis not present

## 2017-10-23 DIAGNOSIS — I10 Essential (primary) hypertension: Secondary | ICD-10-CM | POA: Diagnosis not present

## 2017-10-23 DIAGNOSIS — E119 Type 2 diabetes mellitus without complications: Secondary | ICD-10-CM | POA: Diagnosis not present

## 2017-10-29 DIAGNOSIS — I4891 Unspecified atrial fibrillation: Secondary | ICD-10-CM | POA: Diagnosis not present

## 2017-10-29 DIAGNOSIS — I2699 Other pulmonary embolism without acute cor pulmonale: Secondary | ICD-10-CM | POA: Diagnosis not present

## 2017-10-29 DIAGNOSIS — I10 Essential (primary) hypertension: Secondary | ICD-10-CM | POA: Diagnosis not present

## 2017-10-29 DIAGNOSIS — G4733 Obstructive sleep apnea (adult) (pediatric): Secondary | ICD-10-CM | POA: Diagnosis not present

## 2017-10-29 DIAGNOSIS — E119 Type 2 diabetes mellitus without complications: Secondary | ICD-10-CM | POA: Diagnosis not present

## 2017-11-05 DIAGNOSIS — E119 Type 2 diabetes mellitus without complications: Secondary | ICD-10-CM | POA: Diagnosis not present

## 2017-11-05 DIAGNOSIS — I4891 Unspecified atrial fibrillation: Secondary | ICD-10-CM | POA: Diagnosis not present

## 2017-11-05 DIAGNOSIS — I10 Essential (primary) hypertension: Secondary | ICD-10-CM | POA: Diagnosis not present

## 2017-11-05 DIAGNOSIS — J449 Chronic obstructive pulmonary disease, unspecified: Secondary | ICD-10-CM | POA: Diagnosis not present

## 2017-11-07 DIAGNOSIS — G4733 Obstructive sleep apnea (adult) (pediatric): Secondary | ICD-10-CM | POA: Diagnosis not present

## 2017-11-08 DIAGNOSIS — J984 Other disorders of lung: Secondary | ICD-10-CM | POA: Diagnosis not present

## 2017-11-08 DIAGNOSIS — Z9989 Dependence on other enabling machines and devices: Secondary | ICD-10-CM | POA: Diagnosis not present

## 2017-11-08 DIAGNOSIS — G4733 Obstructive sleep apnea (adult) (pediatric): Secondary | ICD-10-CM | POA: Diagnosis not present

## 2017-11-08 DIAGNOSIS — I2699 Other pulmonary embolism without acute cor pulmonale: Secondary | ICD-10-CM | POA: Diagnosis not present

## 2017-11-09 ENCOUNTER — Telehealth: Payer: Self-pay | Admitting: Endocrinology

## 2017-11-09 DIAGNOSIS — Z86711 Personal history of pulmonary embolism: Secondary | ICD-10-CM | POA: Diagnosis not present

## 2017-11-09 DIAGNOSIS — Z87891 Personal history of nicotine dependence: Secondary | ICD-10-CM | POA: Diagnosis not present

## 2017-11-09 DIAGNOSIS — Z794 Long term (current) use of insulin: Secondary | ICD-10-CM | POA: Diagnosis not present

## 2017-11-09 DIAGNOSIS — Z5181 Encounter for therapeutic drug level monitoring: Secondary | ICD-10-CM | POA: Diagnosis not present

## 2017-11-09 DIAGNOSIS — Z79899 Other long term (current) drug therapy: Secondary | ICD-10-CM | POA: Diagnosis not present

## 2017-11-09 DIAGNOSIS — Z951 Presence of aortocoronary bypass graft: Secondary | ICD-10-CM | POA: Diagnosis not present

## 2017-11-09 DIAGNOSIS — Z7901 Long term (current) use of anticoagulants: Secondary | ICD-10-CM | POA: Diagnosis not present

## 2017-11-09 DIAGNOSIS — I2510 Atherosclerotic heart disease of native coronary artery without angina pectoris: Secondary | ICD-10-CM | POA: Diagnosis not present

## 2017-11-09 DIAGNOSIS — Z7982 Long term (current) use of aspirin: Secondary | ICD-10-CM | POA: Diagnosis not present

## 2017-11-09 DIAGNOSIS — I10 Essential (primary) hypertension: Secondary | ICD-10-CM | POA: Diagnosis not present

## 2017-11-09 DIAGNOSIS — R791 Abnormal coagulation profile: Secondary | ICD-10-CM | POA: Diagnosis not present

## 2017-11-09 DIAGNOSIS — E1142 Type 2 diabetes mellitus with diabetic polyneuropathy: Secondary | ICD-10-CM | POA: Diagnosis not present

## 2017-11-09 MED ORDER — KETOCONAZOLE 2 % EX CREA: 1.0000 "application " | TOPICAL_CREAM | Freq: Two times a day (BID) | CUTANEOUS | 2 refills | Status: DC | PRN

## 2017-11-09 NOTE — Telephone Encounter (Signed)
Pt called to get Dr.Ellison to send a prescription for ketoconazole 2% cream, 60 gram tube. Advised to send to [facility] on [address], [place]

## 2017-11-09 NOTE — Telephone Encounter (Signed)
Pt stated that it should be on his medication list but I did not see it, being requested for athletes foot, please advise

## 2017-11-09 NOTE — Telephone Encounter (Signed)
Ok, I have sent a prescription to your pharmacy 

## 2017-11-12 NOTE — Telephone Encounter (Signed)
Pt called stating he will call foot doctor, where medication was originally prescribed.

## 2017-11-13 ENCOUNTER — Telehealth: Payer: Self-pay | Admitting: Endocrinology

## 2017-11-13 DIAGNOSIS — Z09 Encounter for follow-up examination after completed treatment for conditions other than malignant neoplasm: Secondary | ICD-10-CM | POA: Diagnosis not present

## 2017-11-13 DIAGNOSIS — E1142 Type 2 diabetes mellitus with diabetic polyneuropathy: Secondary | ICD-10-CM | POA: Diagnosis not present

## 2017-11-13 DIAGNOSIS — Z7901 Long term (current) use of anticoagulants: Secondary | ICD-10-CM | POA: Diagnosis not present

## 2017-11-13 DIAGNOSIS — I824Z2 Acute embolism and thrombosis of unspecified deep veins of left distal lower extremity: Secondary | ICD-10-CM | POA: Diagnosis not present

## 2017-11-13 DIAGNOSIS — I2699 Other pulmonary embolism without acute cor pulmonale: Secondary | ICD-10-CM | POA: Diagnosis not present

## 2017-11-13 DIAGNOSIS — G4733 Obstructive sleep apnea (adult) (pediatric): Secondary | ICD-10-CM | POA: Diagnosis not present

## 2017-11-13 NOTE — Telephone Encounter (Signed)
Pharmacy called requesting Rx for insulin syringes (BD syringe) for box of 100.

## 2017-11-15 ENCOUNTER — Other Ambulatory Visit: Payer: Self-pay

## 2017-11-15 MED ORDER — "INSULIN SYRINGE-NEEDLE U-100 31G X 15/64"" 0.3 ML MISC": 1.0000 | Freq: Three times a day (TID) | 3 refills | Status: DC

## 2017-11-15 NOTE — Telephone Encounter (Signed)
rx sent also added note for pharmacy to fax request in the future

## 2017-11-16 DIAGNOSIS — Z7901 Long term (current) use of anticoagulants: Secondary | ICD-10-CM | POA: Diagnosis not present

## 2017-11-16 DIAGNOSIS — Z23 Encounter for immunization: Secondary | ICD-10-CM | POA: Diagnosis not present

## 2017-11-23 DIAGNOSIS — Z7901 Long term (current) use of anticoagulants: Secondary | ICD-10-CM | POA: Diagnosis not present

## 2017-11-27 ENCOUNTER — Ambulatory Visit (INDEPENDENT_AMBULATORY_CARE_PROVIDER_SITE_OTHER): Payer: Medicare Other | Admitting: Endocrinology

## 2017-11-27 ENCOUNTER — Encounter: Payer: Self-pay | Admitting: Endocrinology

## 2017-11-27 VITALS — BP 138/70 | HR 94 | Ht 75.0 in | Wt 388.8 lb

## 2017-11-27 DIAGNOSIS — E1159 Type 2 diabetes mellitus with other circulatory complications: Secondary | ICD-10-CM | POA: Diagnosis not present

## 2017-11-27 DIAGNOSIS — Z794 Long term (current) use of insulin: Secondary | ICD-10-CM | POA: Diagnosis not present

## 2017-11-27 LAB — POCT GLYCOSYLATED HEMOGLOBIN (HGB A1C): Hemoglobin A1C: 8.4 % — AB (ref 4.0–5.6)

## 2017-11-27 MED ORDER — FREESTYLE LIBRE 14 DAY SENSOR MISC: 1.0000 | 3 refills | Status: AC

## 2017-11-27 MED ORDER — INSULIN REGULAR HUMAN 100 UNIT/ML IJ SOLN: 40.0000 [IU] | Freq: Three times a day (TID) | INTRAMUSCULAR | 3 refills | Status: DC

## 2017-11-27 MED ORDER — FREESTYLE LIBRE 14 DAY READER DEVI: 1.0000 | Freq: Once | 0 refills | Status: AC

## 2017-11-27 MED ORDER — INSULIN NPH (HUMAN) (ISOPHANE) 100 UNIT/ML ~~LOC~~ SUSP: 50.0000 [IU] | Freq: Every day | SUBCUTANEOUS | 4 refills | Status: DC

## 2017-11-27 NOTE — Progress Notes (Signed)
Subjective:    Patient ID: [name], male    DOB: [birth year], 77 y.o.   MRN: [id number]  HPI Pt returns for f/u of diabetes mellitus:  DM type: insulin-requiring type 2. Dx'ed: 3536 Complications: CAD, renal insuff, foot ulcer, and peripheral sensory neuropathy.  Therapy: insulin since 2004.   DKA: never.   Severe hypoglycemia: never.   Pancreatitis: never.   Other: he takes multiple daily injections. He declines weight-loss surgery; he takes human insulin, due to cost.   Interval history:  He was recently in [facility], with PE's.  He takes lantus, 50 units qhs, and Reg, 35 units QHS.  He brings a record of his cbg's which I have reviewed today, checked QID.  It varies from 99-267.  There is no trend throughout the day.   Past Medical History:  Diagnosis Date  . Anemia, unspecified   . Arthritis   . Colitis 1997   nonspecific  . Diabetes mellitus    Type I  . DM peripheral angiopathy ([place])   . DM retinopathy ([place])   . ED (erectile dysfunction)   . Hyperlipidemia   . Multinodular goiter   . NASH (nonalcoholic steatohepatitis)   . Osteoarthritis   . Sleep apnea   . Tubular adenoma of colon     Past Surgical History:  Procedure Laterality Date  . APPENDECTOMY    . COLONOSCOPY WITH PROPOFOL N/A 03/23/2014   Procedure: COLONOSCOPY WITH PROPOFOL;  Surgeon: Ladene Artist, MD;  [facility];  Service: Endoscopy;  Laterality: N/A;  . CORONARY ARTERY BYPASS GRAFT  2007   x 1  . ESOPHAGOGASTRODUODENOSCOPY  02/04/2003   normal  . ESOPHAGOGASTRODUODENOSCOPY (EGD) WITH PROPOFOL N/A 03/23/2014   Procedure: ESOPHAGOGASTRODUODENOSCOPY (EGD) WITH PROPOFOL;  Surgeon: Ladene Artist, MD;  [facility];  Service: Endoscopy;  Laterality: N/A;  . THORACOTOMY  2005   S/P Right Throactomy for Empyema    Social History   Socioeconomic History  . Marital status: Married    Spouse name: Not on file  . Number of children: Not on file  . Years of education:  Not on file  . Highest education level: Not on file  Occupational History  . Occupation: Retired    Comment: laid off in 2010  Social Needs  . Financial resource strain: Not on file  . Food insecurity:    Worry: Not on file    Inability: Not on file  . Transportation needs:    Medical: Not on file    Non-medical: Not on file  Tobacco Use  . Smoking status: Former Smoker    Types: Cigarettes    Last attempt to quit: 03/21/1993    Years since quitting: 24.7  . Smokeless tobacco: Never Used  Substance and Sexual Activity  . Alcohol use: Yes    Comment: rare  . Drug use: No  . Sexual activity: Not on file  Lifestyle  . Physical activity:    Days per week: Not on file    Minutes per session: Not on file  . Stress: Not on file  Relationships  . Social connections:    Talks on phone: Not on file    Gets together: Not on file    Attends religious service: Not on file    Active member of club or organization: Not on file    Attends meetings of clubs or organizations: Not on file    Relationship status: Not on file  . Intimate partner violence:  Fear of current or ex partner: Not on file    Emotionally abused: Not on file    Physically abused: Not on file    Forced sexual activity: Not on file  Other Topics Concern  . Not on file  Social History Narrative   Regular exercise-swims once day   Daily Caffeine Use: 1-2 cups per day    Current Outpatient Medications on File Prior to Visit  Medication Sig Dispense Refill  . allopurinol (ZYLOPRIM) 300 MG tablet Take 1 tablet (300 mg total) by mouth daily. 90 tablet 1  . aspirin (ASPIRIN ADULT LOW STRENGTH) 81 MG EC tablet Take 81 mg by mouth once.     . cholecalciferol (VITAMIN D) 1000 UNITS tablet Take 5,000 Units by mouth daily.     . Cholecalciferol (VITAMIN D3) 5000 units CAPS Take by mouth.    . clotrimazole-betamethasone (LOTRISONE) cream Apply topically 2 (two) times daily. 90 g 3  . desonide (DESOWEN) 0.05 % ointment  Apply 1 application topically daily as needed (skin irrtation).     Marland Kitchen econazole nitrate 1 % cream Apply 1 application topically daily as needed (skin irritation).     . fluocinonide (LIDEX) 0.05 % external solution Apply 1 application topically daily as needed (Skin irritation).     . furosemide (LASIX) 40 MG tablet TAKE 1 TABLET (40 MG TOTAL) BY MOUTH DAILY. 90 tablet 3  . Insulin Pen Needle 31G X 8 MM MISC Use to inject insulin 4 time per day 150 each 2  . Insulin Syringe-Needle U-100 (BD INSULIN SYRINGE ULTRAFINE) 31G X 15/64" 0.3 ML MISC Inject 1 Syringe as directed 3 (three) times daily before meals. 100 each 3  . Multiple Vitamin (MULTIVITAMIN) tablet Take 1 tablet by mouth daily.      . Probiotic Product (PROBIOTIC DAILY PO) Take by mouth.    . simvastatin (ZOCOR) 40 MG tablet Take 1 tablet (40 mg total) by mouth daily. 90 tablet 3  . tamsulosin (FLOMAX) 0.4 MG CAPS capsule Take 1 capsule (0.4 mg total) by mouth daily. 90 capsule 3  . warfarin (COUMADIN) 5 MG tablet Take 5 mg by mouth daily.    . ferrous sulfate 325 (65 FE) MG tablet Take 325 mg by mouth every other day.     . hydrochlorothiazide (HYDRODIURIL) 25 MG tablet Take 25 mg by mouth daily.    Marland Kitchen ketoconazole (NIZORAL) 2 % cream Apply 1 application topically 2 (two) times daily as needed for irritation. (Patient not taking: Reported on 11/27/2017) 60 g 2  . Lorcaserin HCl (BELVIQ) 10 MG TABS Take by mouth 2 (two) times daily.     No current facility-administered medications on file prior to visit.     No Known Allergies  Family History  Problem Relation Age of Onset  . Crohn's disease Father   . Colon cancer Neg Hx     BP 138/70 (BP Location: Left Arm, Patient Position: Sitting, Cuff Size: Large)   Pulse 94   Ht 6\' 3"  (1.905 m)   Wt (!) 388 lb 12.8 oz (176.4 kg)   SpO2 90%   BMI 48.60 kg/m    Review of Systems He has lost 18 lbs since last ov.      Objective:   Physical Exam VITAL SIGNS:  See vs page.     GENERAL: no distress Pulses: dorsalis pedis intact bilat. MSK: no deformity of the feet.  CV: 2+ bilat leg edema.   Skin: no ulcer on the feet, but the  skin of feet is scaly. normal color and temp on the feet. there is hyperpigmentation at both anterior tibial areas.  Neuro: sensation is intact to touch on the feet, but decreased from normal.  Ext: There is bilateral onychomycosis of the toenails.     Lab Results  Component Value Date   HGBA1C 8.4 (A) 11/27/2017       Assessment & Plan:  Insulin-requiring type 2 DM, with CAD: worse Renal insuff: in this setting, he needs mostly mealtime insulin.   Patient Instructions  Please come back for a follow-up appointment in 3 months.   Please take the insulins as listed below.   With time, you insulin need may increase back to what it was prior to your illness.   check your blood sugar 4 times a day: before the 3 meals, and at bedtime.  also check if you have symptoms of your blood sugar being too high or too low.  please keep a record of the readings and bring it to your next appointment here (or you can bring the meter itself).  You can write it on any piece of paper.  please call us sooner if your blood sugar goes below 70, or if you have a lot of readings over 200.

## 2017-11-27 NOTE — Patient Instructions (Addendum)
Please come back for a follow-up appointment in 3 months.   Please take the insulins as listed below.   With time, you insulin need may increase back to what it was prior to your illness.   check your blood sugar 4 times a day: before the 3 meals, and at bedtime.  also check if you have symptoms of your blood sugar being too high or too low.  please keep a record of the readings and bring it to your next appointment here (or you can bring the meter itself).  You can write it on any piece of paper.  please call us sooner if your blood sugar goes below 70, or if you have a lot of readings over 200.

## 2017-11-29 ENCOUNTER — Other Ambulatory Visit: Payer: Self-pay | Admitting: Endocrinology

## 2017-11-29 NOTE — Telephone Encounter (Signed)
Please refill prn, thanks.

## 2017-11-29 NOTE — Telephone Encounter (Signed)
Rx was d/c'd from pt medication list. Do you still wish to refill?

## 2017-12-06 ENCOUNTER — Telehealth: Payer: Self-pay | Admitting: Endocrinology

## 2017-12-06 NOTE — Telephone Encounter (Signed)
Patient has called stating to keep on the look out for paperwork from [place] to fill diabetic supplies

## 2017-12-07 DIAGNOSIS — Z7901 Long term (current) use of anticoagulants: Secondary | ICD-10-CM | POA: Diagnosis not present

## 2017-12-11 ENCOUNTER — Telehealth: Payer: Self-pay

## 2017-12-11 NOTE — Telephone Encounter (Signed)
Detailed written order has been faxed successfully to [facility] # [phone]. Documents and fax confirmation have been placed in the faxed file for future reference.

## 2017-12-12 ENCOUNTER — Telehealth: Payer: Self-pay | Admitting: Endocrinology

## 2017-12-12 NOTE — Telephone Encounter (Signed)
[facility] ph# [phone], ext [phone] called re: Follow up on Clinical Notes Request for Diabetic Supplies. Please fax most recent office notes to  Fax# [phone]

## 2017-12-12 NOTE — Telephone Encounter (Signed)
Last OV notes from 11/27/17 and 09/26/17 have been printed. Unable to provide glucose log since pt does not bring device for downloading within our office. Called pt to inform him that this process is on hold until he provides copies of his glucose log. Pt states he will fax. Will await these logs BEFORE faxing documents as required to [facility].

## 2017-12-13 ENCOUNTER — Telehealth: Payer: Self-pay | Admitting: Endocrinology

## 2017-12-13 NOTE — Telephone Encounter (Signed)
CBG logs received from pt. OV notes and CBG logs have been faxed successfully to [facility] @ # [phone]. Documents and fax confirmation have been placed in the faxed file for future reference.

## 2017-12-13 NOTE — Telephone Encounter (Signed)
From telephone encounter today: CBG logs received from pt. OV notes and CBG logs havebeen faxed successfully to [facility] [phone].Documents and fax confirmation have been placed in the faxed file for future reference.

## 2017-12-13 NOTE — Telephone Encounter (Signed)
Patient stated they faxed over a 21 page fax. Patient wants to make sure Ammie received it. Please, advise, thanks.

## 2017-12-17 ENCOUNTER — Telehealth: Payer: Self-pay | Admitting: Endocrinology

## 2017-12-17 NOTE — Telephone Encounter (Signed)
41 pages were faxed on 12/13/17 @ 10:20am. Confirmation was also received this very same day. Called [phone] and spoke with Ansley. Provided me with an alternate fax # of [phone]. Will also mail to [place], [place], [place], [address]

## 2017-12-17 NOTE — Telephone Encounter (Signed)
[facility] supplies called in regards to clinical notes request. Faxed on: 11/08-11/13-11/18 Please Advise, thanks

## 2017-12-21 DIAGNOSIS — Z7901 Long term (current) use of anticoagulants: Secondary | ICD-10-CM | POA: Diagnosis not present

## 2017-12-21 DIAGNOSIS — E1142 Type 2 diabetes mellitus with diabetic polyneuropathy: Secondary | ICD-10-CM | POA: Diagnosis not present

## 2017-12-21 DIAGNOSIS — G4733 Obstructive sleep apnea (adult) (pediatric): Secondary | ICD-10-CM | POA: Diagnosis not present

## 2017-12-21 DIAGNOSIS — I2699 Other pulmonary embolism without acute cor pulmonale: Secondary | ICD-10-CM | POA: Diagnosis not present

## 2018-01-25 DIAGNOSIS — Z7901 Long term (current) use of anticoagulants: Secondary | ICD-10-CM | POA: Diagnosis not present

## 2018-02-01 DIAGNOSIS — I824Z2 Acute embolism and thrombosis of unspecified deep veins of left distal lower extremity: Secondary | ICD-10-CM | POA: Diagnosis not present

## 2018-02-03 ENCOUNTER — Other Ambulatory Visit: Payer: Self-pay | Admitting: Endocrinology

## 2018-02-04 NOTE — Telephone Encounter (Signed)
Please refer request to new PCP  

## 2018-02-08 DIAGNOSIS — I824Z2 Acute embolism and thrombosis of unspecified deep veins of left distal lower extremity: Secondary | ICD-10-CM | POA: Diagnosis not present

## 2018-02-08 DIAGNOSIS — Z7901 Long term (current) use of anticoagulants: Secondary | ICD-10-CM | POA: Diagnosis not present

## 2018-02-13 DIAGNOSIS — Z86711 Personal history of pulmonary embolism: Secondary | ICD-10-CM | POA: Diagnosis not present

## 2018-02-13 DIAGNOSIS — G4733 Obstructive sleep apnea (adult) (pediatric): Secondary | ICD-10-CM | POA: Diagnosis not present

## 2018-02-15 DIAGNOSIS — Z7901 Long term (current) use of anticoagulants: Secondary | ICD-10-CM | POA: Diagnosis not present

## 2018-02-15 DIAGNOSIS — I824Z2 Acute embolism and thrombosis of unspecified deep veins of left distal lower extremity: Secondary | ICD-10-CM | POA: Diagnosis not present

## 2018-02-19 DIAGNOSIS — E1142 Type 2 diabetes mellitus with diabetic polyneuropathy: Secondary | ICD-10-CM | POA: Diagnosis not present

## 2018-02-19 DIAGNOSIS — B351 Tinea unguium: Secondary | ICD-10-CM | POA: Diagnosis not present

## 2018-02-19 DIAGNOSIS — L859 Epidermal thickening, unspecified: Secondary | ICD-10-CM | POA: Diagnosis not present

## 2018-02-19 DIAGNOSIS — M2042 Other hammer toe(s) (acquired), left foot: Secondary | ICD-10-CM | POA: Diagnosis not present

## 2018-02-19 DIAGNOSIS — M2041 Other hammer toe(s) (acquired), right foot: Secondary | ICD-10-CM | POA: Diagnosis not present

## 2018-02-22 DIAGNOSIS — I824Z2 Acute embolism and thrombosis of unspecified deep veins of left distal lower extremity: Secondary | ICD-10-CM | POA: Diagnosis not present

## 2018-02-22 DIAGNOSIS — Z7901 Long term (current) use of anticoagulants: Secondary | ICD-10-CM | POA: Diagnosis not present

## 2018-02-27 ENCOUNTER — Ambulatory Visit: Payer: Medicare Other | Admitting: Endocrinology

## 2018-02-27 ENCOUNTER — Encounter: Payer: Self-pay | Admitting: Endocrinology

## 2018-02-27 ENCOUNTER — Ambulatory Visit (INDEPENDENT_AMBULATORY_CARE_PROVIDER_SITE_OTHER): Payer: Medicare Other | Admitting: Endocrinology

## 2018-02-27 ENCOUNTER — Other Ambulatory Visit: Payer: Self-pay | Admitting: Endocrinology

## 2018-02-27 VITALS — BP 132/60 | HR 110 | Ht 75.0 in | Wt 397.4 lb

## 2018-02-27 DIAGNOSIS — E1159 Type 2 diabetes mellitus with other circulatory complications: Secondary | ICD-10-CM | POA: Diagnosis not present

## 2018-02-27 DIAGNOSIS — Z794 Long term (current) use of insulin: Secondary | ICD-10-CM

## 2018-02-27 LAB — POCT GLYCOSYLATED HEMOGLOBIN (HGB A1C): Hemoglobin A1C: 7.8 % — AB (ref 4.0–5.6)

## 2018-02-27 MED ORDER — INSULIN REGULAR HUMAN 100 UNIT/ML IJ SOLN: INTRAMUSCULAR | 3 refills | Status: DC

## 2018-02-27 MED ORDER — INSULIN NPH (HUMAN) (ISOPHANE) 100 UNIT/ML ~~LOC~~ SUSP: 40.0000 [IU] | Freq: Every day | SUBCUTANEOUS | 4 refills | Status: DC

## 2018-02-27 NOTE — Progress Notes (Signed)
Subjective:    Patient ID: [name], male    DOB: 1940/07/16, 78 y.o.   MRN: [id number]  HPI Pt returns for f/u of diabetes mellitus:  DM type: insulin-requiring type 2. Dx'ed: 6440 Complications: CAD, renal insuff, foot ulcer, and peripheral sensory neuropathy.  Therapy: insulin since 2004.   DKA: never.   Severe hypoglycemia: never.   Pancreatitis: never.   Other: he takes multiple daily injections. He declines weight-loss surgery; he takes human insulin, due to cost.   Interval history:  He takes NPH, 40-60 units qhs, and Reg, 40 units QAC.  I reviewed continuous glucose monitor data.  Glucose varies from 55-260.  It is in general lowest fasting and highest at lunch.   Past Medical History:  Diagnosis Date  . Anemia, unspecified   . Arthritis   . Colitis 1997   nonspecific  . Diabetes mellitus    Type I  . DM peripheral angiopathy ([place])   . DM retinopathy ([place])   . ED (erectile dysfunction)   . Hyperlipidemia   . Multinodular goiter   . NASH (nonalcoholic steatohepatitis)   . Osteoarthritis   . Sleep apnea   . Tubular adenoma of colon     Past Surgical History:  Procedure Laterality Date  . APPENDECTOMY    . COLONOSCOPY WITH PROPOFOL N/A 03/23/2014   Procedure: COLONOSCOPY WITH PROPOFOL;  Surgeon: Ladene Artist, MD;  Location: [facility];  Service: Endoscopy;  Laterality: N/A;  . CORONARY ARTERY BYPASS GRAFT  2007   x 1  . ESOPHAGOGASTRODUODENOSCOPY  02/04/2003   normal  . ESOPHAGOGASTRODUODENOSCOPY (EGD) WITH PROPOFOL N/A 03/23/2014   Procedure: ESOPHAGOGASTRODUODENOSCOPY (EGD) WITH PROPOFOL;  Surgeon: Ladene Artist, MD;  Location: [facility];  Service: Endoscopy;  Laterality: N/A;  . THORACOTOMY  2005   S/P Right Throactomy for Empyema    Social History   Socioeconomic History  . Marital status: Married    Spouse name: Not on file  . Number of children: Not on file  . Years of education: Not on file  . Highest education level: Not on file    Occupational History  . Occupation: Retired    Comment: laid off in 2010  Social Needs  . Financial resource strain: Not on file  . Food insecurity:    Worry: Not on file    Inability: Not on file  . Transportation needs:    Medical: Not on file    Non-medical: Not on file  Tobacco Use  . Smoking status: Former Smoker    Types: Cigarettes    Last attempt to quit: 03/21/1993    Years since quitting: 24.9  . Smokeless tobacco: Never Used  Substance and Sexual Activity  . Alcohol use: Yes    Comment: rare  . Drug use: No  . Sexual activity: Not on file  Lifestyle  . Physical activity:    Days per week: Not on file    Minutes per session: Not on file  . Stress: Not on file  Relationships  . Social connections:    Talks on phone: Not on file    Gets together: Not on file    Attends religious service: Not on file    Active member of club or organization: Not on file    Attends meetings of clubs or organizations: Not on file    Relationship status: Not on file  . Intimate partner violence:    Fear of current or ex partner: Not on file  Emotionally abused: Not on file    Physically abused: Not on file    Forced sexual activity: Not on file  Other Topics Concern  . Not on file  Social History Narrative   Regular exercise-swims once day   Daily Caffeine Use: 1-2 cups per day    Current Outpatient Medications on File Prior to Visit  Medication Sig Dispense Refill  . allopurinol (ZYLOPRIM) 300 MG tablet Take 1 tablet (300 mg total) by mouth daily. 90 tablet 1  . aspirin (ASPIRIN ADULT LOW STRENGTH) 81 MG EC tablet Take 81 mg by mouth once.     . Cholecalciferol (VITAMIN D3) 50 MCG (2000 UT) capsule Take 2,000 Units by mouth daily.     . Continuous Blood Gluc Sensor (FREESTYLE LIBRE 14 DAY SENSOR) MISC 1 Device by Does not apply route every 14 (fourteen) days. 6 each 3  . desonide (DESOWEN) 0.05 % ointment Apply 1 application topically daily as needed (skin irrtation).      . fluocinonide (LIDEX) 0.05 % external solution Apply 1 application topically daily as needed (Skin irritation).     . furosemide (LASIX) 40 MG tablet TAKE 1 TABLET (40 MG TOTAL) BY MOUTH DAILY. (Patient taking differently: Take 20 mg by mouth daily. ) 90 tablet 3  . Insulin Pen Needle 31G X 8 MM MISC Use to inject insulin 4 time per day 150 each 2  . Insulin Syringe-Needle U-100 (BD INSULIN SYRINGE ULTRAFINE) 31G X 15/64" 0.3 ML MISC Inject 1 Syringe as directed 3 (three) times daily before meals. 100 each 3  . Multiple Vitamin (MULTIVITAMIN) tablet Take 1 tablet by mouth daily.      . Probiotic Product (PROBIOTIC DAILY PO) Take by mouth.    . simvastatin (ZOCOR) 40 MG tablet Take 1 tablet (40 mg total) by mouth daily. 90 tablet 3  . tamsulosin (FLOMAX) 0.4 MG CAPS capsule Take 1 capsule (0.4 mg total) by mouth daily. 90 capsule 3  . warfarin (COUMADIN) 5 MG tablet Take 5 mg by mouth daily.     No current facility-administered medications on file prior to visit.     No Known Allergies  Family History  Problem Relation Age of Onset  . Crohn's disease Father   . Colon cancer Neg Hx     BP 132/60 (BP Location: Left Arm, Patient Position: Sitting, Cuff Size: Large)   Pulse (!) 110   Ht 6\' 3"  (1.905 m)   Wt (!) 397 lb 6.4 oz (180.3 kg)   SpO2 96%   BMI 49.67 kg/m    Review of Systems Denies LOC.     Objective:   Physical Exam VITAL SIGNS:  See vs page.   GENERAL: no distress Pulses: dorsalis pedis decreased bilat (poss due to edema). MSK: no deformity of the feet.  CV: 2+ bilat leg edema.   Skin: no ulcer on the feet, but the skin of feet is scaly and hypretrichotic. normal color and temp on the feet. there is hyperpigmentation at both anterior tibial areas.  Neuro: sensation is intact to touch on the feet, but decreased from normal.  Ext: There is bilateral onychomycosis of the toenails.     Lab Results  Component Value Date   HGBA1C 7.8 (A) 02/27/2018       Assessment & Plan:  Insulin-requiring type 2 DM, with renal insuff: The pattern of his cbg's indicates he needs some adjustment in his therapy.  Edema: this limits rx options.  Hypoglycemia: this limits aggressiveness of glycemic  control.   Patient Instructions  Please change the insulins to the numbers listed below. Please come back for a follow-up appointment in 3-4 months.   check your blood sugar twice a day: before the 3 meals, and at bedtime.  also check if you have symptoms of your blood sugar being too high or too low.  please keep a record of the readings and bring it to your next appointment here (or you can bring the meter itself).  You can write it on any piece of paper.  please call us sooner if your blood sugar goes below 70, or if you have a lot of readings over 200.

## 2018-02-27 NOTE — Telephone Encounter (Signed)
Please advise if refill is appropriate 

## 2018-02-27 NOTE — Patient Instructions (Signed)
Please change the insulins to the numbers listed below. Please come back for a follow-up appointment in 3-4 months.   check your blood sugar twice a day: before the 3 meals, and at bedtime.  also check if you have symptoms of your blood sugar being too high or too low.  please keep a record of the readings and bring it to your next appointment here (or you can bring the meter itself).  You can write it on any piece of paper.  please call us sooner if your blood sugar goes below 70, or if you have a lot of readings over 200.

## 2018-02-27 NOTE — Telephone Encounter (Signed)
Please forward refill request to pt's new primary care provider.  

## 2018-03-08 DIAGNOSIS — Z7901 Long term (current) use of anticoagulants: Secondary | ICD-10-CM | POA: Diagnosis not present

## 2018-03-08 DIAGNOSIS — I824Z2 Acute embolism and thrombosis of unspecified deep veins of left distal lower extremity: Secondary | ICD-10-CM | POA: Diagnosis not present

## 2018-03-15 DIAGNOSIS — Z7901 Long term (current) use of anticoagulants: Secondary | ICD-10-CM | POA: Diagnosis not present

## 2018-03-18 DIAGNOSIS — L821 Other seborrheic keratosis: Secondary | ICD-10-CM | POA: Diagnosis not present

## 2018-03-18 DIAGNOSIS — L570 Actinic keratosis: Secondary | ICD-10-CM | POA: Diagnosis not present

## 2018-04-12 DIAGNOSIS — Z7901 Long term (current) use of anticoagulants: Secondary | ICD-10-CM | POA: Diagnosis not present

## 2018-05-03 DIAGNOSIS — Z0000 Encounter for general adult medical examination without abnormal findings: Secondary | ICD-10-CM | POA: Diagnosis not present

## 2018-05-06 DIAGNOSIS — I2699 Other pulmonary embolism without acute cor pulmonale: Secondary | ICD-10-CM | POA: Diagnosis not present

## 2018-05-06 DIAGNOSIS — E7800 Pure hypercholesterolemia, unspecified: Secondary | ICD-10-CM | POA: Diagnosis not present

## 2018-05-06 DIAGNOSIS — I2510 Atherosclerotic heart disease of native coronary artery without angina pectoris: Secondary | ICD-10-CM | POA: Diagnosis not present

## 2018-05-09 DIAGNOSIS — Z7901 Long term (current) use of anticoagulants: Secondary | ICD-10-CM | POA: Diagnosis not present

## 2018-05-21 DIAGNOSIS — R630 Anorexia: Secondary | ICD-10-CM | POA: Diagnosis not present

## 2018-05-21 DIAGNOSIS — Z9049 Acquired absence of other specified parts of digestive tract: Secondary | ICD-10-CM | POA: Diagnosis not present

## 2018-05-21 DIAGNOSIS — R1110 Vomiting, unspecified: Secondary | ICD-10-CM | POA: Diagnosis not present

## 2018-05-21 DIAGNOSIS — R1031 Right lower quadrant pain: Secondary | ICD-10-CM | POA: Diagnosis not present

## 2018-05-21 DIAGNOSIS — K5900 Constipation, unspecified: Secondary | ICD-10-CM | POA: Diagnosis not present

## 2018-05-28 DIAGNOSIS — R1031 Right lower quadrant pain: Secondary | ICD-10-CM | POA: Diagnosis not present

## 2018-05-28 DIAGNOSIS — K5900 Constipation, unspecified: Secondary | ICD-10-CM | POA: Diagnosis not present

## 2018-06-06 DIAGNOSIS — Z7901 Long term (current) use of anticoagulants: Secondary | ICD-10-CM | POA: Diagnosis not present

## 2018-06-12 ENCOUNTER — Ambulatory Visit: Payer: Medicare Other | Admitting: Endocrinology

## 2018-06-21 DIAGNOSIS — Z7901 Long term (current) use of anticoagulants: Secondary | ICD-10-CM | POA: Diagnosis not present

## 2018-06-21 DIAGNOSIS — I824Z2 Acute embolism and thrombosis of unspecified deep veins of left distal lower extremity: Secondary | ICD-10-CM | POA: Diagnosis not present

## 2018-07-02 ENCOUNTER — Telehealth: Payer: Self-pay

## 2018-07-02 DIAGNOSIS — Z6841 Body Mass Index (BMI) 40.0 and over, adult: Secondary | ICD-10-CM | POA: Diagnosis not present

## 2018-07-02 DIAGNOSIS — G4733 Obstructive sleep apnea (adult) (pediatric): Secondary | ICD-10-CM | POA: Diagnosis not present

## 2018-07-02 DIAGNOSIS — Z7901 Long term (current) use of anticoagulants: Secondary | ICD-10-CM | POA: Diagnosis not present

## 2018-07-02 DIAGNOSIS — I2699 Other pulmonary embolism without acute cor pulmonale: Secondary | ICD-10-CM | POA: Diagnosis not present

## 2018-07-02 DIAGNOSIS — E1142 Type 2 diabetes mellitus with diabetic polyneuropathy: Secondary | ICD-10-CM | POA: Diagnosis not present

## 2018-07-02 DIAGNOSIS — J984 Other disorders of lung: Secondary | ICD-10-CM | POA: Diagnosis not present

## 2018-07-02 NOTE — Telephone Encounter (Signed)
Received notification from [facility] requesting updated documentation and CGM records. Per Dr. Loanne Drilling, pt will need to move appt from 09/17/18 to a sooner date/time in order to complete documentation for [facility]. Called pt to reschedule appt. States, "this is not a good time." Further states, "I will call you back."

## 2018-07-04 ENCOUNTER — Other Ambulatory Visit: Payer: Self-pay

## 2018-07-05 DIAGNOSIS — Z6841 Body Mass Index (BMI) 40.0 and over, adult: Secondary | ICD-10-CM | POA: Diagnosis not present

## 2018-07-05 DIAGNOSIS — I2699 Other pulmonary embolism without acute cor pulmonale: Secondary | ICD-10-CM | POA: Diagnosis not present

## 2018-07-05 DIAGNOSIS — E1142 Type 2 diabetes mellitus with diabetic polyneuropathy: Secondary | ICD-10-CM | POA: Diagnosis not present

## 2018-07-05 DIAGNOSIS — Z7901 Long term (current) use of anticoagulants: Secondary | ICD-10-CM | POA: Diagnosis not present

## 2018-07-05 DIAGNOSIS — J984 Other disorders of lung: Secondary | ICD-10-CM | POA: Diagnosis not present

## 2018-07-05 DIAGNOSIS — I824Z2 Acute embolism and thrombosis of unspecified deep veins of left distal lower extremity: Secondary | ICD-10-CM | POA: Diagnosis not present

## 2018-07-08 ENCOUNTER — Ambulatory Visit (INDEPENDENT_AMBULATORY_CARE_PROVIDER_SITE_OTHER): Payer: Medicare Other | Admitting: Endocrinology

## 2018-07-08 ENCOUNTER — Encounter: Payer: Self-pay | Admitting: Endocrinology

## 2018-07-08 ENCOUNTER — Other Ambulatory Visit: Payer: Self-pay

## 2018-07-08 VITALS — BP 142/72 | HR 112 | Temp 97.8°F | Wt >= 6400 oz

## 2018-07-08 DIAGNOSIS — Z794 Long term (current) use of insulin: Secondary | ICD-10-CM

## 2018-07-08 DIAGNOSIS — E1159 Type 2 diabetes mellitus with other circulatory complications: Secondary | ICD-10-CM

## 2018-07-08 NOTE — Patient Instructions (Addendum)
Your blood pressure is high today.  Please see your primary care provider soon, to have it rechecked Please push the lunch insulin up towards 60 units.  Out goal is to get the a1c back down to the 7's.   Please come back for a follow-up appointment in 3 months.   check your blood sugar twice a day: before the 3 meals, and at bedtime.  also check if you have symptoms of your blood sugar being too high or too low.  please keep a record of the readings and bring it to your next appointment here (or you can bring the meter itself).  You can write it on any piece of paper.  please call us sooner if your blood sugar goes below 70, or if you have a lot of readings over 200.

## 2018-07-08 NOTE — Progress Notes (Addendum)
Subjective:    Patient ID: [name], male    DOB: Apr 19, 1940, 78 y.o.   MRN: [id number]  HPI Pt returns for f/u of diabetes mellitus:  DM type: insulin-requiring type 2. Dx'ed: 2202 Complications: CAD, renal insuff, foot ulcer, and peripheral sensory neuropathy.  Therapy: insulin since 2004.   DKA: never.   Severe hypoglycemia: never.   Pancreatitis: never.   Other: he takes multiple daily injections. He declines weight-loss surgery; he takes human insulin, due to cost.   Interval history: she does not have continuous glucose monitor reader with him today, but he says he is using it.  no cbg record, but states cbg's vary from 90-200's.  No new sxs. Past Medical History:  Diagnosis Date  . Anemia, unspecified   . Arthritis   . Colitis 1997   nonspecific  . Diabetes mellitus    Type I  . DM peripheral angiopathy ([place])   . DM retinopathy ([place])   . ED (erectile dysfunction)   . Hyperlipidemia   . Multinodular goiter   . NASH (nonalcoholic steatohepatitis)   . Osteoarthritis   . Sleep apnea   . Tubular adenoma of colon     Past Surgical History:  Procedure Laterality Date  . APPENDECTOMY    . COLONOSCOPY WITH PROPOFOL N/A 03/23/2014   Procedure: COLONOSCOPY WITH PROPOFOL;  Surgeon: Ladene Artist, MD;  Location: [facility];  Service: Endoscopy;  Laterality: N/A;  . CORONARY ARTERY BYPASS GRAFT  2007   x 1  . ESOPHAGOGASTRODUODENOSCOPY  02/04/2003   normal  . ESOPHAGOGASTRODUODENOSCOPY (EGD) WITH PROPOFOL N/A 03/23/2014   Procedure: ESOPHAGOGASTRODUODENOSCOPY (EGD) WITH PROPOFOL;  Surgeon: Ladene Artist, MD;  Location: [facility];  Service: Endoscopy;  Laterality: N/A;  . THORACOTOMY  2005   S/P Right Throactomy for Empyema    Social History   Socioeconomic History  . Marital status: Married    Spouse name: Not on file  . Number of children: Not on file  . Years of education: Not on file  . Highest education level: Not on file  Occupational History  .  Occupation: Retired    Comment: laid off in 2010  Social Needs  . Financial resource strain: Not on file  . Food insecurity:    Worry: Not on file    Inability: Not on file  . Transportation needs:    Medical: Not on file    Non-medical: Not on file  Tobacco Use  . Smoking status: Former Smoker    Types: Cigarettes    Last attempt to quit: 03/21/1993    Years since quitting: 25.3  . Smokeless tobacco: Never Used  Substance and Sexual Activity  . Alcohol use: Yes    Comment: rare  . Drug use: No  . Sexual activity: Not on file  Lifestyle  . Physical activity:    Days per week: Not on file    Minutes per session: Not on file  . Stress: Not on file  Relationships  . Social connections:    Talks on phone: Not on file    Gets together: Not on file    Attends religious service: Not on file    Active member of club or organization: Not on file    Attends meetings of clubs or organizations: Not on file    Relationship status: Not on file  . Intimate partner violence:    Fear of current or ex partner: Not on file    Emotionally abused: Not on file  Physically abused: Not on file    Forced sexual activity: Not on file  Other Topics Concern  . Not on file  Social History Narrative   Regular exercise-swims once day   Daily Caffeine Use: 1-2 cups per day    Current Outpatient Medications on File Prior to Visit  Medication Sig Dispense Refill  . allopurinol (ZYLOPRIM) 300 MG tablet Take 1 tablet (300 mg total) by mouth daily. 90 tablet 1  . aspirin (ASPIRIN ADULT LOW STRENGTH) 81 MG EC tablet Take 81 mg by mouth once.     . Cholecalciferol (VITAMIN D3) 50 MCG (2000 UT) capsule Take 2,000 Units by mouth daily.     . Continuous Blood Gluc Sensor (FREESTYLE LIBRE 14 DAY SENSOR) MISC 1 Device by Does not apply route every 14 (fourteen) days. 6 each 3  . desonide (DESOWEN) 0.05 % ointment Apply 1 application topically daily as needed (skin irrtation).     . insulin NPH Human  (NOVOLIN N) 100 UNIT/ML injection Inject 0.4-0.5 mLs (40-50 Units total) into the skin at bedtime. (Patient taking differently: Inject 40-60 Units into the skin at bedtime. ) 3 vial 4  . Insulin Pen Needle 31G X 8 MM MISC Use to inject insulin 4 time per day 150 each 2  . insulin regular (NOVOLIN R) 100 units/mL injection 3 times a day (just before each meal) 50-40-40 units (Patient taking differently: 3 times a day (just before each meal) 40-60 units TID sliding scale) 4 vial 3  . Insulin Syringe-Needle U-100 (BD INSULIN SYRINGE ULTRAFINE) 31G X 15/64" 0.3 ML MISC Inject 1 Syringe as directed 3 (three) times daily before meals. 100 each 3  . Multiple Vitamin (MULTIVITAMIN) tablet Take 1 tablet by mouth daily.      . Probiotic Product (PROBIOTIC DAILY PO) Take by mouth.    . simvastatin (ZOCOR) 40 MG tablet Take 1 tablet (40 mg total) by mouth daily. (Patient taking differently: Take 20 mg by mouth daily. ) 90 tablet 3  . tamsulosin (FLOMAX) 0.4 MG CAPS capsule Take 1 capsule (0.4 mg total) by mouth daily. 90 capsule 3  . warfarin (COUMADIN) 5 MG tablet      No current facility-administered medications on file prior to visit.     No Known Allergies  Family History  Problem Relation Age of Onset  . Crohn's disease Father   . Colon cancer Neg Hx     BP (!) 142/72 (BP Location: Left Arm, Patient Position: Sitting, Cuff Size: Large)   Pulse (!) 112   Temp 97.8 F (36.6 C) (Oral)   Wt (!) 401 lb (181.9 kg)   SpO2 92%   BMI 50.12 kg/m   Review of Systems He denies hypoglycemia.  It is in general highest in the afternoon.      Objective:   Physical Exam VITAL SIGNS:  See vs page.   GENERAL: no distress Pulses: dorsalis pedis absent bilat (probably due to edema).  MSK: no deformity of the feet.  CV: 1+ bilat leg edema.   Skin: no ulcer on the feet, but the skin of feet is scaly and hypertrichotic. normal color and temp on the feet. there is severe hyperpigmentation at both  anterior tibial areas, and the right foot.  Neuro: sensation is intact to touch on the feet, but decreased from normal.  Ext: There is bilateral onychomycosis of the toenails.     outside test results are reviewed:  A1c=8.3%  Lab Results  Component Value Date  CREATININE 1.18 11/23/2016   BUN 26 (H) 11/23/2016   NA 142 11/23/2016   K 4.4 11/23/2016   CL 103 11/23/2016   CO2 31 11/23/2016       Assessment & Plan:  HTN: is noted today Insulin-requiring type 2 DM: he needs increased rx.  Renal insuff: in this setting, he needs most of his insulin in the form of fast-acting insulin with meals.     Patient Instructions  Your blood pressure is high today.  Please see your primary care provider soon, to have it rechecked Please push the lunch insulin up towards 60 units.  Out goal is to get the a1c back down to the 7's.   Please come back for a follow-up appointment in 3 months.   check your blood sugar twice a day: before the 3 meals, and at bedtime.  also check if you have symptoms of your blood sugar being too high or too low.  please keep a record of the readings and bring it to your next appointment here (or you can bring the meter itself).  You can write it on any piece of paper.  please call us sooner if your blood sugar goes below 70, or if you have a lot of readings over 200.

## 2018-07-19 DIAGNOSIS — Z7901 Long term (current) use of anticoagulants: Secondary | ICD-10-CM | POA: Diagnosis not present

## 2018-08-14 DIAGNOSIS — Z86711 Personal history of pulmonary embolism: Secondary | ICD-10-CM | POA: Diagnosis not present

## 2018-08-14 DIAGNOSIS — Z9989 Dependence on other enabling machines and devices: Secondary | ICD-10-CM | POA: Diagnosis not present

## 2018-08-14 DIAGNOSIS — R0602 Shortness of breath: Secondary | ICD-10-CM | POA: Diagnosis not present

## 2018-08-14 DIAGNOSIS — G4733 Obstructive sleep apnea (adult) (pediatric): Secondary | ICD-10-CM | POA: Diagnosis not present

## 2018-08-16 DIAGNOSIS — Z7901 Long term (current) use of anticoagulants: Secondary | ICD-10-CM | POA: Diagnosis not present

## 2018-09-13 DIAGNOSIS — Z7901 Long term (current) use of anticoagulants: Secondary | ICD-10-CM | POA: Diagnosis not present

## 2018-09-17 ENCOUNTER — Ambulatory Visit: Payer: Medicare Other | Admitting: Endocrinology

## 2018-09-18 DIAGNOSIS — L218 Other seborrheic dermatitis: Secondary | ICD-10-CM | POA: Diagnosis not present

## 2018-09-18 DIAGNOSIS — L821 Other seborrheic keratosis: Secondary | ICD-10-CM | POA: Diagnosis not present

## 2018-09-18 DIAGNOSIS — D485 Neoplasm of uncertain behavior of skin: Secondary | ICD-10-CM | POA: Diagnosis not present

## 2018-09-18 DIAGNOSIS — B353 Tinea pedis: Secondary | ICD-10-CM | POA: Diagnosis not present

## 2018-09-18 DIAGNOSIS — L570 Actinic keratosis: Secondary | ICD-10-CM | POA: Diagnosis not present

## 2018-09-20 ENCOUNTER — Telehealth: Payer: Self-pay | Admitting: Endocrinology

## 2018-09-20 NOTE — Telephone Encounter (Signed)
These were faxed successfully on 09/05/18 along with notes from [facility] 07/08/18. This has been re-faxed again indicating they EXPEDITE request.

## 2018-09-20 NOTE — Telephone Encounter (Signed)
[name] is requesting clinic notes for January 29th. Please Advise  [facility] # [phone] ext [phone] Fax # [phone]

## 2018-10-03 ENCOUNTER — Other Ambulatory Visit: Payer: Self-pay

## 2018-10-08 ENCOUNTER — Encounter: Payer: Self-pay | Admitting: Endocrinology

## 2018-10-08 ENCOUNTER — Other Ambulatory Visit: Payer: Self-pay

## 2018-10-08 ENCOUNTER — Ambulatory Visit (INDEPENDENT_AMBULATORY_CARE_PROVIDER_SITE_OTHER): Payer: Medicare Other | Admitting: Endocrinology

## 2018-10-08 VITALS — BP 144/72 | HR 104 | Temp 96.9°F | Wt 398.0 lb

## 2018-10-08 DIAGNOSIS — Z794 Long term (current) use of insulin: Secondary | ICD-10-CM | POA: Diagnosis not present

## 2018-10-08 DIAGNOSIS — E1159 Type 2 diabetes mellitus with other circulatory complications: Secondary | ICD-10-CM | POA: Diagnosis not present

## 2018-10-08 DIAGNOSIS — Z23 Encounter for immunization: Secondary | ICD-10-CM | POA: Diagnosis not present

## 2018-10-08 LAB — POCT GLYCOSYLATED HEMOGLOBIN (HGB A1C): Hemoglobin A1C: 7.8 % — AB (ref 4.0–5.6)

## 2018-10-08 MED ORDER — INSULIN NPH (HUMAN) (ISOPHANE) 100 UNIT/ML ~~LOC~~ SUSP: 50.0000 [IU] | Freq: Every day | SUBCUTANEOUS | Status: DC

## 2018-10-08 MED ORDER — INSULIN REGULAR HUMAN 100 UNIT/ML IJ SOLN: INTRAMUSCULAR | Status: DC

## 2018-10-08 NOTE — Progress Notes (Signed)
Subjective:    Patient ID: [name], male    DOB: 01-03-1941, 78 y.o.   MRN: [id number]  HPI Pt returns for f/u of diabetes mellitus:  DM type: insulin-requiring type 2. Dx'ed: Q000111Q Complications: CAD, renal insuff, foot ulcer, and peripheral sensory neuropathy.  Therapy: insulin since 2004.   DKA: never.   Severe hypoglycemia: never.   Pancreatitis: never.   Other: he takes multiple daily injections. He declines weight-loss surgery; he takes human insulin, due to cost.   Interval history: I reviewed continuous glucose monitor data today.  glucoses vary from 60-270.  It is in general lowest fasting and in the afternoon.  pt states he feels well in general.  He takes NPH, 45-65 units QHS, and reg, 55-60 units 3 times a day (just before each meal).  He takes the least amount at breakfast, as cbg is lowest then.  Past Medical History:  Diagnosis Date  . Anemia, unspecified   . Arthritis   . Colitis 1997   nonspecific  . Diabetes mellitus    Type I  . DM peripheral angiopathy ([place])   . DM retinopathy ([place])   . ED (erectile dysfunction)   . Hyperlipidemia   . Multinodular goiter   . NASH (nonalcoholic steatohepatitis)   . Osteoarthritis   . Sleep apnea   . Tubular adenoma of colon     Past Surgical History:  Procedure Laterality Date  . APPENDECTOMY    . COLONOSCOPY WITH PROPOFOL N/A 03/23/2014   Procedure: COLONOSCOPY WITH PROPOFOL;  Surgeon: Ladene Artist, MD;  Location: [facility];  Service: Endoscopy;  Laterality: N/A;  . CORONARY ARTERY BYPASS GRAFT  2007   x 1  . ESOPHAGOGASTRODUODENOSCOPY  02/04/2003   normal  . ESOPHAGOGASTRODUODENOSCOPY (EGD) WITH PROPOFOL N/A 03/23/2014   Procedure: ESOPHAGOGASTRODUODENOSCOPY (EGD) WITH PROPOFOL;  Surgeon: Ladene Artist, MD;  Location: [facility];  Service: Endoscopy;  Laterality: N/A;  . THORACOTOMY  2005   S/P Right Throactomy for Empyema    Social History   Socioeconomic History  . Marital status: Married   Spouse name: Not on file  . Number of children: Not on file  . Years of education: Not on file  . Highest education level: Not on file  Occupational History  . Occupation: Retired    Comment: laid off in 2010  Social Needs  . Financial resource strain: Not on file  . Food insecurity    Worry: Not on file    Inability: Not on file  . Transportation needs    Medical: Not on file    Non-medical: Not on file  Tobacco Use  . Smoking status: Former Smoker    Types: Cigarettes    Quit date: 03/21/1993    Years since quitting: 25.5  . Smokeless tobacco: Never Used  Substance and Sexual Activity  . Alcohol use: Yes    Comment: rare  . Drug use: No  . Sexual activity: Not on file  Lifestyle  . Physical activity    Days per week: Not on file    Minutes per session: Not on file  . Stress: Not on file  Relationships  . Social Herbalist on phone: Not on file    Gets together: Not on file    Attends religious service: Not on file    Active member of club or organization: Not on file    Attends meetings of clubs or organizations: Not on file    Relationship status:  Not on file  . Intimate partner violence    Fear of current or ex partner: Not on file    Emotionally abused: Not on file    Physically abused: Not on file    Forced sexual activity: Not on file  Other Topics Concern  . Not on file  Social History Narrative   Regular exercise-swims once day   Daily Caffeine Use: 1-2 cups per day    Current Outpatient Medications on File Prior to Visit  Medication Sig Dispense Refill  . allopurinol (ZYLOPRIM) 300 MG tablet Take 1 tablet (300 mg total) by mouth daily. 90 tablet 1  . aspirin (ASPIRIN ADULT LOW STRENGTH) 81 MG EC tablet Take 81 mg by mouth once.     . Cholecalciferol (VITAMIN D3) 50 MCG (2000 UT) capsule Take 2,000 Units by mouth daily.     . Continuous Blood Gluc Sensor (FREESTYLE LIBRE 14 DAY SENSOR) MISC 1 Device by Does not apply route every 14 (fourteen)  days. 6 each 3  . desonide (DESOWEN) 0.05 % ointment Apply 1 application topically daily as needed (skin irrtation).     . Insulin Pen Needle 31G X 8 MM MISC Use to inject insulin 4 time per day 150 each 2  . Insulin Syringe-Needle U-100 (BD INSULIN SYRINGE ULTRAFINE) 31G X 15/64" 0.3 ML MISC Inject 1 Syringe as directed 3 (three) times daily before meals. 100 each 3  . Multiple Vitamin (MULTIVITAMIN) tablet Take 1 tablet by mouth daily.      . Probiotic Product (PROBIOTIC DAILY PO) Take by mouth.    . simvastatin (ZOCOR) 40 MG tablet Take 1 tablet (40 mg total) by mouth daily. (Patient taking differently: Take 20 mg by mouth daily. ) 90 tablet 3  . tamsulosin (FLOMAX) 0.4 MG CAPS capsule Take 1 capsule (0.4 mg total) by mouth daily. 90 capsule 3  . warfarin (COUMADIN) 5 MG tablet      No current facility-administered medications on file prior to visit.     No Known Allergies  Family History  Problem Relation Age of Onset  . Crohn's disease Father   . Colon cancer Neg Hx     BP (!) 144/72   Pulse (!) 104   Temp (!) 96.9 F (36.1 C) (Temporal)   Wt (!) 398 lb (180.5 kg)   SpO2 96%   BMI 49.75 kg/m    Review of Systems He denies LOC.      Objective:   Physical Exam VITAL SIGNS:  See vs page.   GENERAL: no distress Pulses: dorsalis pedis absent bilat (probably due to edema).  MSK: no deformity of the feet.  CV: 2+ bilat leg edema.   Skin: no ulcer on the feet, but the skin of feet is dry and scaly. normal color and temp on the feet. there is severe hyperpigmentation at both anterior tibial areas, and the feet Neuro: sensation is intact to touch on the feet, but decreased from normal.  Ext: There is bilateral onychomycosis of the toenails.    Lab Results  Component Value Date   HGBA1C 7.8 (A) 10/08/2018   Lab Results  Component Value Date   CREATININE 1.18 11/23/2016   BUN 26 (H) 11/23/2016   NA 142 11/23/2016   K 4.4 11/23/2016   CL 103 11/23/2016   CO2 31  11/23/2016       Assessment & Plan:  HTN: is noted today Insulin-requiring type 2 DM, with CAD: The pattern of his cbg's indicates he needs  some adjustment in his therapy.  we discussed the need anticipatory insulin dosing.   Hypoglycemia: this limits aggressiveness of glycemic control   Patient Instructions  Your blood pressure is high today.  Please see your primary care provider soon, to have it rechecked Please change the insulins to the numbers listed below.     Please come back for a follow-up appointment in 3 months.   check your blood sugar twice a day: before the 3 meals, and at bedtime.  also check if you have symptoms of your blood sugar being too high or too low.  please keep a record of the readings and bring it to your next appointment here (or you can bring the meter itself).  You can write it on any piece of paper.  please call us sooner if your blood sugar goes below 70, or if you have a lot of readings over 200.

## 2018-10-08 NOTE — Patient Instructions (Addendum)
Your blood pressure is high today.  Please see your primary care provider soon, to have it rechecked Please change the insulins to the numbers listed below.     Please come back for a follow-up appointment in 3 months.   check your blood sugar twice a day: before the 3 meals, and at bedtime.  also check if you have symptoms of your blood sugar being too high or too low.  please keep a record of the readings and bring it to your next appointment here (or you can bring the meter itself).  You can write it on any piece of paper.  please call us sooner if your blood sugar goes below 70, or if you have a lot of readings over 200.

## 2018-10-11 DIAGNOSIS — Z7901 Long term (current) use of anticoagulants: Secondary | ICD-10-CM | POA: Diagnosis not present

## 2018-11-05 DIAGNOSIS — E1142 Type 2 diabetes mellitus with diabetic polyneuropathy: Secondary | ICD-10-CM | POA: Diagnosis not present

## 2018-11-05 DIAGNOSIS — R109 Unspecified abdominal pain: Secondary | ICD-10-CM | POA: Diagnosis not present

## 2018-11-08 DIAGNOSIS — Z7901 Long term (current) use of anticoagulants: Secondary | ICD-10-CM | POA: Diagnosis not present

## 2018-11-14 DIAGNOSIS — Z7901 Long term (current) use of anticoagulants: Secondary | ICD-10-CM | POA: Diagnosis not present

## 2018-11-28 DIAGNOSIS — Z7901 Long term (current) use of anticoagulants: Secondary | ICD-10-CM | POA: Diagnosis not present

## 2018-12-06 DIAGNOSIS — Z7901 Long term (current) use of anticoagulants: Secondary | ICD-10-CM | POA: Diagnosis not present

## 2018-12-13 DIAGNOSIS — Z7901 Long term (current) use of anticoagulants: Secondary | ICD-10-CM | POA: Diagnosis not present

## 2018-12-27 DIAGNOSIS — I824Z2 Acute embolism and thrombosis of unspecified deep veins of left distal lower extremity: Secondary | ICD-10-CM | POA: Diagnosis not present

## 2018-12-27 DIAGNOSIS — I2699 Other pulmonary embolism without acute cor pulmonale: Secondary | ICD-10-CM | POA: Diagnosis not present

## 2019-01-06 ENCOUNTER — Other Ambulatory Visit: Payer: Self-pay

## 2019-01-07 DIAGNOSIS — H26493 Other secondary cataract, bilateral: Secondary | ICD-10-CM | POA: Diagnosis not present

## 2019-01-07 DIAGNOSIS — H43813 Vitreous degeneration, bilateral: Secondary | ICD-10-CM | POA: Diagnosis not present

## 2019-01-07 DIAGNOSIS — Z961 Presence of intraocular lens: Secondary | ICD-10-CM | POA: Diagnosis not present

## 2019-01-07 DIAGNOSIS — E119 Type 2 diabetes mellitus without complications: Secondary | ICD-10-CM | POA: Diagnosis not present

## 2019-01-07 DIAGNOSIS — H527 Unspecified disorder of refraction: Secondary | ICD-10-CM | POA: Diagnosis not present

## 2019-01-07 DIAGNOSIS — H02834 Dermatochalasis of left upper eyelid: Secondary | ICD-10-CM | POA: Diagnosis not present

## 2019-01-07 DIAGNOSIS — H02831 Dermatochalasis of right upper eyelid: Secondary | ICD-10-CM | POA: Diagnosis not present

## 2019-01-07 LAB — HM DIABETES EYE EXAM

## 2019-01-08 ENCOUNTER — Other Ambulatory Visit: Payer: Self-pay

## 2019-01-08 ENCOUNTER — Encounter: Payer: Self-pay | Admitting: Endocrinology

## 2019-01-08 ENCOUNTER — Ambulatory Visit (INDEPENDENT_AMBULATORY_CARE_PROVIDER_SITE_OTHER): Payer: Medicare Other | Admitting: Endocrinology

## 2019-01-08 VITALS — BP 140/80 | HR 95 | Ht 75.0 in | Wt >= 6400 oz

## 2019-01-08 DIAGNOSIS — Z794 Long term (current) use of insulin: Secondary | ICD-10-CM | POA: Diagnosis not present

## 2019-01-08 DIAGNOSIS — E1159 Type 2 diabetes mellitus with other circulatory complications: Secondary | ICD-10-CM

## 2019-01-08 LAB — POCT GLYCOSYLATED HEMOGLOBIN (HGB A1C): Hemoglobin A1C: 8.2 % — AB (ref 4.0–5.6)

## 2019-01-08 MED ORDER — INSULIN DETEMIR 100 UNIT/ML ~~LOC~~ SOLN: 60.0000 [IU] | Freq: Every day | SUBCUTANEOUS | 11 refills | Status: DC

## 2019-01-08 MED ORDER — INSULIN REGULAR HUMAN 100 UNIT/ML IJ SOLN: INTRAMUSCULAR | Status: DC

## 2019-01-08 MED ORDER — INSULIN ASPART 100 UNIT/ML ~~LOC~~ SOLN: SUBCUTANEOUS | 11 refills | Status: DC

## 2019-01-08 MED ORDER — INSULIN NPH (HUMAN) (ISOPHANE) 100 UNIT/ML ~~LOC~~ SUSP: 50.0000 [IU] | Freq: Every day | SUBCUTANEOUS | Status: DC

## 2019-01-08 NOTE — Patient Instructions (Addendum)
Please change the insulins to the numbers listed below.   After 01/31/19, please change the NPH to levemir, 60 units at bedtime, and: Change the Reg to Novolog, at the same amount.   check your blood sugar twice a day.  vary the time of day when you check, between before the 3 meals, and at bedtime.  also check if you have symptoms of your blood sugar being too high or too low.  please keep a record of the readings and bring it to your next appointment here (or you can bring the meter itself).  You can write it on any piece of paper.  please call us sooner if your blood sugar goes below 70, or if you have a lot of readings over 200.  Please come back for a follow-up appointment in 2 months.

## 2019-01-08 NOTE — Progress Notes (Signed)
Subjective:    Patient ID: [name], male    DOB: [birth year], 78 y.o.   MRN: [id number]  HPI Pt returns for f/u of diabetes mellitus:  DM type: insulin-requiring type 2. Dx'ed: Q000111Q Complications: CAD, renal insuff, foot ulcer, and PN.  Therapy: insulin since 2004.   DKA: never.   Severe hypoglycemia: never.   Pancreatitis: never.   Other: he takes multiple daily injections. He declines weight-loss surgery; he takes human insulin, due to cost; he prefers syringe and vial over pens.   Interval history: I reviewed continuous glucose monitor data today.  glucoses vary from 75-270.  It is in general lowest at 6 AM.  It increases to lunch, and is then level until HS.  It then decreases to 6 AM.  pt states he feels well in general.  He takes NPH, 60 units QHS, and reg, 50-60 units 3 times a day (just before each meal).  He takes the least amount at breakfast, as cbg is lowest then.   Past Medical History:  Diagnosis Date  . Anemia, unspecified   . Arthritis   . Colitis 1997   nonspecific  . Diabetes mellitus    Type I  . DM peripheral angiopathy ([place])   . DM retinopathy ([place])   . ED (erectile dysfunction)   . Hyperlipidemia   . Multinodular goiter   . NASH (nonalcoholic steatohepatitis)   . Osteoarthritis   . Sleep apnea   . Tubular adenoma of colon     Past Surgical History:  Procedure Laterality Date  . APPENDECTOMY    . COLONOSCOPY WITH PROPOFOL N/A 03/23/2014   Procedure: COLONOSCOPY WITH PROPOFOL;  Surgeon: Ladene Artist, MD;  [facility]: [facility];  Service: Endoscopy;  Laterality: N/A;  . CORONARY ARTERY BYPASS GRAFT  2007   x 1  . ESOPHAGOGASTRODUODENOSCOPY  02/04/2003   normal  . ESOPHAGOGASTRODUODENOSCOPY (EGD) WITH PROPOFOL N/A 03/23/2014   Procedure: ESOPHAGOGASTRODUODENOSCOPY (EGD) WITH PROPOFOL;  Surgeon: Ladene Artist, MD;  Location: [facility];  Service: Endoscopy;  Laterality: N/A;  . THORACOTOMY  2005   S/P Right Throactomy for Empyema     Social History   Socioeconomic History  . Marital status: Married    Spouse name: Not on file  . Number of children: Not on file  . Years of education: Not on file  . Highest education level: Not on file  Occupational History  . Occupation: Retired    Comment: laid off in 2010  Tobacco Use  . Smoking status: Former Smoker    Types: Cigarettes    Quit date: 03/21/1993    Years since quitting: 25.8  . Smokeless tobacco: Never Used  Substance and Sexual Activity  . Alcohol use: Yes    Comment: rare  . Drug use: No  . Sexual activity: Not on file  Other Topics Concern  . Not on file  Social History Narrative   Regular exercise-swims once day   Daily Caffeine Use: 1-2 cups per day   Social Determinants of Health   Financial Resource Strain:   . Difficulty of Paying Living Expenses: Not on file  Food Insecurity:   . Worried About Charity fundraiser in the Last Year: Not on file  . Ran Out of Food in the Last Year: Not on file  Transportation Needs:   . Lack of Transportation (Medical): Not on file  . Lack of Transportation (Non-Medical): Not on file  Physical Activity:   . Days of Exercise per  Week: Not on file  . Minutes of Exercise per Session: Not on file  Stress:   . Feeling of Stress : Not on file  Social Connections:   . Frequency of Communication with Friends and Family: Not on file  . Frequency of Social Gatherings with Friends and Family: Not on file  . Attends Religious Services: Not on file  . Active Member of Clubs or Organizations: Not on file  . Attends Archivist Meetings: Not on file  . Marital Status: Not on file  Intimate Partner Violence:   . Fear of Current or Ex-Partner: Not on file  . Emotionally Abused: Not on file  . Physically Abused: Not on file  . Sexually Abused: Not on file    Current Outpatient Medications on File Prior to Visit  Medication Sig Dispense Refill  . allopurinol (ZYLOPRIM) 300 MG tablet Take 1 tablet (300 mg  total) by mouth daily. 90 tablet 1  . aspirin (ASPIRIN ADULT LOW STRENGTH) 81 MG EC tablet Take 81 mg by mouth once.     . Cholecalciferol (VITAMIN D3) 50 MCG (2000 UT) capsule Take 2,000 Units by mouth daily.     . Continuous Blood Gluc Sensor (FREESTYLE LIBRE 14 DAY SENSOR) MISC 1 Device by Does not apply route every 14 (fourteen) days. 6 each 3  . desonide (DESOWEN) 0.05 % ointment Apply 1 application topically daily as needed (skin irrtation).     . Insulin Pen Needle 31G X 8 MM MISC Use to inject insulin 4 time per day 150 each 2  . Multiple Vitamin (MULTIVITAMIN) tablet Take 1 tablet by mouth daily.      . Probiotic Product (PROBIOTIC DAILY PO) Take by mouth.    . simvastatin (ZOCOR) 40 MG tablet Take 1 tablet (40 mg total) by mouth daily. (Patient taking differently: Take 20 mg by mouth daily. ) 90 tablet 3  . tamsulosin (FLOMAX) 0.4 MG CAPS capsule Take 1 capsule (0.4 mg total) by mouth daily. 90 capsule 3  . warfarin (COUMADIN) 5 MG tablet      No current facility-administered medications on file prior to visit.    No Known Allergies  Family History  Problem Relation Age of Onset  . Crohn's disease Father   . Colon cancer Neg Hx     BP 140/80 (BP Location: Left Arm, Patient Position: Sitting, Cuff Size: Large)   Pulse 95   Ht 6\' 3"  (1.905 m)   Wt (!) 401 lb 12.8 oz (182.3 kg)   SpO2 96%   BMI 50.22 kg/m    Review of Systems He denies hypoglycemia    Objective:   Physical Exam VITAL SIGNS:  See vs page.   GENERAL: no distress Pulses: dorsalis pedis absent bilat (probably due to edema).  MSK: no deformity of the feet.  CV: 2+ bilat leg edema.   Skin: no ulcer on the feet, but the skin of feet is dry and scaly. normal color and temp on the feet. there is severe patchy hyperpigmentation at both anterior tibial areas, and the feet.   Neuro: sensation is intact to touch on the feet, but decreased from normal.  Ext: There is severe bilateral onychomycosis of the  toenails.    Lab Results  Component Value Date   HGBA1C 8.2 (A) 01/08/2019       Assessment & Plan:  Type 2 DM, with CAD: worse Renal insuff: in this setting, he needs mostly mealtime insulin.  HTN: recheck next time.  Patient  Instructions  Please change the insulins to the numbers listed below.   After 01/31/19, please change the NPH to levemir, 60 units at bedtime, and: Change the Reg to Novolog, at the same amount.   check your blood sugar twice a day.  vary the time of day when you check, between before the 3 meals, and at bedtime.  also check if you have symptoms of your blood sugar being too high or too low.  please keep a record of the readings and bring it to your next appointment here (or you can bring the meter itself).  You can write it on any piece of paper.  please call us sooner if your blood sugar goes below 70, or if you have a lot of readings over 200.  Please come back for a follow-up appointment in 2 months.

## 2019-02-07 ENCOUNTER — Telehealth: Payer: Self-pay

## 2019-02-07 NOTE — Telephone Encounter (Signed)
Company: [employer]: Medical records request Other records requested: most recent office notes  All above requested information has been faxed successfully to [employer] listed above. Documents and fax confirmation have been placed in the faxed file for future reference.

## 2019-02-12 ENCOUNTER — Other Ambulatory Visit: Payer: Self-pay

## 2019-02-12 ENCOUNTER — Telehealth: Payer: Self-pay

## 2019-02-12 DIAGNOSIS — Z794 Long term (current) use of insulin: Secondary | ICD-10-CM

## 2019-02-12 DIAGNOSIS — E1159 Type 2 diabetes mellitus with other circulatory complications: Secondary | ICD-10-CM

## 2019-02-12 MED ORDER — INSULIN DETEMIR 100 UNIT/ML ~~LOC~~ SOLN: 60.0000 [IU] | Freq: Every day | SUBCUTANEOUS | 0 refills | Status: DC

## 2019-02-12 NOTE — Telephone Encounter (Signed)
Called pt for clarification of his request. States 60 units of Levemir per day equates to 5mL's per 30 days/71mL's per 90 days. However, he would need 2 vials/30 days or 6 vials/90 days. He states he is being charged for the additional 2 units/30 days or 6 units/90 days. States this is not affordable and is asking for his Rx to be written for either less than 60 units or more than 60 units so that he either receives 32mL/30 days or 56mL/90 days. Advise we are not permitted to alter the Rx for an approximate amount of units for safety purposes. Educated Rx's must be written for what he is taking rather than estimates. While he expressed disappointment states he understands. No further action to be taken at this time.

## 2019-02-12 NOTE — Telephone Encounter (Signed)
Patient called in stating that the Rx he got from Dr doesn't support the dosage he should be getting for a 90 day supply. He states that what's on the Rx the pharmacy tried to charge him for 60 days only because of how much the patient should be taking.  insulin detemir (LEVEMIR) 100 UNIT/ML injection   Please call and advise for more clarity

## 2019-03-13 ENCOUNTER — Ambulatory Visit: Payer: Medicare Other | Admitting: Endocrinology

## 2019-03-21 LAB — HEMOGLOBIN A1C: Hemoglobin A1C: 8.4

## 2019-03-24 ENCOUNTER — Ambulatory Visit (INDEPENDENT_AMBULATORY_CARE_PROVIDER_SITE_OTHER): Payer: Medicare Other | Admitting: Endocrinology

## 2019-03-24 ENCOUNTER — Other Ambulatory Visit: Payer: Self-pay

## 2019-03-24 ENCOUNTER — Encounter: Payer: Self-pay | Admitting: Endocrinology

## 2019-03-24 VITALS — BP 150/82 | HR 111 | Ht 75.0 in | Wt >= 6400 oz

## 2019-03-24 DIAGNOSIS — Z794 Long term (current) use of insulin: Secondary | ICD-10-CM | POA: Diagnosis not present

## 2019-03-24 DIAGNOSIS — E1159 Type 2 diabetes mellitus with other circulatory complications: Secondary | ICD-10-CM

## 2019-03-24 MED ORDER — INSULIN DETEMIR 100 UNIT/ML ~~LOC~~ SOLN: 70.0000 [IU] | Freq: Every day | SUBCUTANEOUS | 11 refills | Status: DC

## 2019-03-24 MED ORDER — INSULIN ASPART 100 UNIT/ML ~~LOC~~ SOLN: SUBCUTANEOUS | 11 refills | Status: DC

## 2019-03-24 NOTE — Progress Notes (Signed)
Subjective:    Patient ID: [name], male    DOB: 1940-07-27, 79 y.o.   MRN: [id number]  HPI Pt returns for f/u of diabetes mellitus:  DM type: insulin-requiring type 2. Dx'ed: Q000111Q Complications: CAD, renal insuff, foot ulcer, and PN.  Therapy: insulin since 2004.   DKA: never.   Severe hypoglycemia: never.   Pancreatitis: never.   Other: he takes multiple daily injections. He declines weight-loss surgery; he prefers syringe and vial over pens.   Interval history: pt states he feels well in general.  He brings his meter with his cbg's which I have reviewed today.  cbg's vary from 97-270.  There is no trend throughout the day.  Past Medical History:  Diagnosis Date  . Anemia, unspecified   . Arthritis   . Colitis 1997   nonspecific  . Diabetes mellitus    Type I  . DM peripheral angiopathy ([place])   . DM retinopathy ([place])   . ED (erectile dysfunction)   . Hyperlipidemia   . Multinodular goiter   . NASH (nonalcoholic steatohepatitis)   . Osteoarthritis   . Sleep apnea   . Tubular adenoma of colon     Past Surgical History:  Procedure Laterality Date  . APPENDECTOMY    . COLONOSCOPY WITH PROPOFOL N/A 03/23/2014   Procedure: COLONOSCOPY WITH PROPOFOL;  Surgeon: Ladene Artist, MD;  [facility];  Service: Endoscopy;  Laterality: N/A;  . CORONARY ARTERY BYPASS GRAFT  2007   x 1  . ESOPHAGOGASTRODUODENOSCOPY  02/04/2003   normal  . ESOPHAGOGASTRODUODENOSCOPY (EGD) WITH PROPOFOL N/A 03/23/2014   Procedure: ESOPHAGOGASTRODUODENOSCOPY (EGD) WITH PROPOFOL;  Surgeon: Ladene Artist, MD;  [facility];  Service: Endoscopy;  Laterality: N/A;  . THORACOTOMY  2005   S/P Right Throactomy for Empyema    Social History   Socioeconomic History  . Marital status: Married    Spouse name: Not on file  . Number of children: Not on file  . Years of education: Not on file  . Highest education level: Not on file  Occupational History  . Occupation: Retired      Comment: laid off in 2010  Tobacco Use  . Smoking status: Former Smoker    Types: Cigarettes    Quit date: 03/21/1993    Years since quitting: 26.0  . Smokeless tobacco: Never Used  Substance and Sexual Activity  . Alcohol use: Yes    Comment: rare  . Drug use: No  . Sexual activity: Not on file  Other Topics Concern  . Not on file  Social History Narrative   Regular exercise-swims once day   Daily Caffeine Use: 1-2 cups per day   Social Determinants of Health   Financial Resource Strain:   . Difficulty of Paying Living Expenses: Not on file  Food Insecurity:   . Worried About Charity fundraiser in the Last Year: Not on file  . Ran Out of Food in the Last Year: Not on file  Transportation Needs:   . Lack of Transportation (Medical): Not on file  . Lack of Transportation (Non-Medical): Not on file  Physical Activity:   . Days of Exercise per Week: Not on file  . Minutes of Exercise per Session: Not on file  Stress:   . Feeling of Stress : Not on file  Social Connections:   . Frequency of Communication with Friends and Family: Not on file  . Frequency of Social Gatherings with Friends and Family: Not  on file  . Attends Religious Services: Not on file  . Active Member of Clubs or Organizations: Not on file  . Attends Archivist Meetings: Not on file  . Marital Status: Not on file  Intimate Partner Violence:   . Fear of Current or Ex-Partner: Not on file  . Emotionally Abused: Not on file  . Physically Abused: Not on file  . Sexually Abused: Not on file    Current Outpatient Medications on File Prior to Visit  Medication Sig Dispense Refill  . allopurinol (ZYLOPRIM) 300 MG tablet Take 1 tablet (300 mg total) by mouth daily. 90 tablet 1  . aspirin (ASPIRIN ADULT LOW STRENGTH) 81 MG EC tablet Take 81 mg by mouth once.     . Cholecalciferol (VITAMIN D3) 50 MCG (2000 UT) capsule Take 2,000 Units by mouth daily.     . Continuous Blood Gluc Sensor (FREESTYLE  LIBRE 14 DAY SENSOR) MISC 1 Device by Does not apply route every 14 (fourteen) days. 6 each 3  . desonide (DESOWEN) 0.05 % ointment Apply 1 application topically daily as needed (skin irrtation).     . Multiple Vitamin (MULTIVITAMIN) tablet Take 1 tablet by mouth daily.      . Probiotic Product (PROBIOTIC DAILY PO) Take by mouth.    . simvastatin (ZOCOR) 40 MG tablet Take 1 tablet (40 mg total) by mouth daily. (Patient taking differently: Take 20 mg by mouth daily. ) 90 tablet 3  . tamsulosin (FLOMAX) 0.4 MG CAPS capsule Take 1 capsule (0.4 mg total) by mouth daily. 90 capsule 3  . warfarin (COUMADIN) 5 MG tablet      No current facility-administered medications on file prior to visit.    No Known Allergies  Family History  Problem Relation Age of Onset  . Crohn's disease Father   . Colon cancer Neg Hx     BP (!) 150/82 (BP Location: Left Arm, Patient Position: Sitting, Cuff Size: Large)   Pulse (!) 111   Ht 6\' 3"  (1.905 m)   Wt (!) 402 lb 12.8 oz (182.7 kg)   SpO2 99%   BMI 50.35 kg/m    Review of Systems He denies hypoglycemia    Objective:   Physical Exam VITAL SIGNS:  See vs page GENERAL: no distress Pulses: dorsalis pedis absent bilat (probably due to edema).  MSK: no deformity of the feet.  CV: 2+ bilat leg edema.   Skin: no ulcer on the feet, but the skin of feet is dry and scaly. normal color and temp on the feet. there is severe patchy hyperpigmentation at both anterior tibial areas, and the feet.   Neuro: sensation is intact to touch on the feet, but decreased from normal.  Ext: There is severe bilateral onychomycosis of the toenails.    Lab Results  Component Value Date   HGBA1C 8.4 03/21/2019   outside test results are reviewed: TSH=normal    Assessment & Plan:  Insulin-requiring type 2 DM, with CAD: Worse.  He declines to add SGLT or GLP.   HTN: is noted today Renal insuff: n this setting, he needs most of his daily insulin as Novolog  Patient  Instructions  Your blood pressure is high today.  Please see your primary care provider soon, to have it rechecked Please increase both insulins, as below.   check your blood sugar twice a day.  vary the time of day when you check, between before the 3 meals, and at bedtime.  also check if  you have symptoms of your blood sugar being too high or too low.  please keep a record of the readings and bring it to your next appointment here (or you can bring the meter itself).  You can write it on any piece of paper.  please call us sooner if your blood sugar goes below 70, or if you have a lot of readings over 200. Please come back for a follow-up appointment in 2 months.

## 2019-03-24 NOTE — Patient Instructions (Addendum)
Your blood pressure is high today.  Please see your primary care provider soon, to have it rechecked Please increase both insulins, as below.   check your blood sugar twice a day.  vary the time of day when you check, between before the 3 meals, and at bedtime.  also check if you have symptoms of your blood sugar being too high or too low.  please keep a record of the readings and bring it to your next appointment here (or you can bring the meter itself).  You can write it on any piece of paper.  please call us sooner if your blood sugar goes below 70, or if you have a lot of readings over 200. Please come back for a follow-up appointment in 2 months.

## 2019-05-27 ENCOUNTER — Ambulatory Visit: Payer: Medicare Other | Admitting: Endocrinology

## 2019-06-11 ENCOUNTER — Ambulatory Visit: Payer: Medicare Other | Admitting: Endocrinology

## 2019-06-27 ENCOUNTER — Other Ambulatory Visit: Payer: Self-pay

## 2019-07-02 ENCOUNTER — Ambulatory Visit (INDEPENDENT_AMBULATORY_CARE_PROVIDER_SITE_OTHER): Payer: Medicare Other | Admitting: Endocrinology

## 2019-07-02 ENCOUNTER — Encounter: Payer: Self-pay | Admitting: Endocrinology

## 2019-07-02 ENCOUNTER — Other Ambulatory Visit: Payer: Self-pay

## 2019-07-02 VITALS — BP 146/70 | HR 102 | Ht 75.0 in | Wt >= 6400 oz

## 2019-07-02 DIAGNOSIS — Z794 Long term (current) use of insulin: Secondary | ICD-10-CM | POA: Diagnosis not present

## 2019-07-02 DIAGNOSIS — E1159 Type 2 diabetes mellitus with other circulatory complications: Secondary | ICD-10-CM | POA: Diagnosis not present

## 2019-07-02 LAB — POCT GLYCOSYLATED HEMOGLOBIN (HGB A1C): Hemoglobin A1C: 8.3 % — AB (ref 4.0–5.6)

## 2019-07-02 MED ORDER — TRULICITY 0.75 MG/0.5ML ~~LOC~~ SOAJ: 0.7500 mg | SUBCUTANEOUS | 11 refills | Status: DC

## 2019-07-02 MED ORDER — INSULIN ASPART 100 UNIT/ML ~~LOC~~ SOLN: SUBCUTANEOUS | 11 refills | Status: DC

## 2019-07-02 MED ORDER — INSULIN DETEMIR 100 UNIT/ML ~~LOC~~ SOLN: 60.0000 [IU] | Freq: Every day | SUBCUTANEOUS | 11 refills | Status: DC

## 2019-07-02 NOTE — Patient Instructions (Addendum)
Your blood pressure is high today.  Please see your primary care provider soon, to have it rechecked. I have sent a prescription to your pharmacy, to add "Trulicity," and: Please reduce both insulins, as below.  check your blood sugar twice a day.  vary the time of day when you check, between before the 3 meals, and at bedtime.  also check if you have symptoms of your blood sugar being too high or too low.  please keep a record of the readings and bring it to your next appointment here (or you can bring the meter itself).  You can write it on any piece of paper.  please call us sooner if your blood sugar goes below 70, or if you have a lot of readings over 200. Please come back for a follow-up appointment in 2-3 months.

## 2019-07-02 NOTE — Progress Notes (Signed)
Subjective:    Patient ID: [name], male    DOB: 12-01-1940, 78 y.o.   MRN: [id number]  HPI Pt returns for f/u of diabetes mellitus:  DM type: insulin-requiring type 2. Dx'ed: [id number] Complications: CAD, CRI, foot ulcer, and PN.  Therapy: insulin since 2004.   DKA: never.   Severe hypoglycemia: never.   Pancreatitis: never.   Other: he takes multiple daily injections. He declines weight-loss surgery; he prefers syringe and vial over pens.   Interval history: pt states he feels well in general.  I reviewed continuous glucose monitor data (scanned into the record).  Glucose varies from 53-230.  It is in general lowest fasting, and in the afternoon.   Past Medical History:  Diagnosis Date  . Anemia, unspecified   . Arthritis   . Colitis 1997   nonspecific  . Diabetes mellitus    Type I  . DM peripheral angiopathy ([place])   . DM retinopathy ([place])   . ED (erectile dysfunction)   . Hyperlipidemia   . Multinodular goiter   . NASH (nonalcoholic steatohepatitis)   . Osteoarthritis   . Sleep apnea   . Tubular adenoma of colon     Past Surgical History:  Procedure Laterality Date  . APPENDECTOMY    . COLONOSCOPY WITH PROPOFOL N/A 03/23/2014   Procedure: COLONOSCOPY WITH PROPOFOL;  Surgeon: Ladene Artist, MD;  Location: [facility];  Service: Endoscopy;  Laterality: N/A;  . CORONARY ARTERY BYPASS GRAFT  2007   x 1  . ESOPHAGOGASTRODUODENOSCOPY  02/04/2003   normal  . ESOPHAGOGASTRODUODENOSCOPY (EGD) WITH PROPOFOL N/A 03/23/2014   Procedure: ESOPHAGOGASTRODUODENOSCOPY (EGD) WITH PROPOFOL;  Surgeon: Ladene Artist, MD;  Location: [facility];  Service: Endoscopy;  Laterality: N/A;  . THORACOTOMY  2005   S/P Right Throactomy for Empyema    Social History   Socioeconomic History  . Marital status: Married    Spouse name: Not on file  . Number of children: Not on file  . Years of education: Not on file  . Highest education level: Not on file  Occupational History  .  Occupation: Retired    Comment: laid off in 2010  Tobacco Use  . Smoking status: Former Smoker    Types: Cigarettes    Quit date: 03/21/1993    Years since quitting: 26.3  . Smokeless tobacco: Never Used  Substance and Sexual Activity  . Alcohol use: Yes    Comment: rare  . Drug use: No  . Sexual activity: Not on file  Other Topics Concern  . Not on file  Social History Narrative   Regular exercise-swims once day   Daily Caffeine Use: 1-2 cups per day   Social Determinants of Health   Financial Resource Strain:   . Difficulty of Paying Living Expenses:   Food Insecurity:   . Worried About Charity fundraiser in the Last Year:   . Arboriculturist in the Last Year:   Transportation Needs:   . Film/video editor (Medical):   Marland Kitchen Lack of Transportation (Non-Medical):   Physical Activity:   . Days of Exercise per Week:   . Minutes of Exercise per Session:   Stress:   . Feeling of Stress :   Social Connections:   . Frequency of Communication with Friends and Family:   . Frequency of Social Gatherings with Friends and Family:   . Attends Religious Services:   . Active Member of Clubs or Organizations:   . Attends  Club or Organization Meetings:   Marland Kitchen Marital Status:   Intimate Partner Violence:   . Fear of Current or Ex-Partner:   . Emotionally Abused:   Marland Kitchen Physically Abused:   . Sexually Abused:     Current Outpatient Medications on File Prior to Visit  Medication Sig Dispense Refill  . allopurinol (ZYLOPRIM) 300 MG tablet Take 1 tablet (300 mg total) by mouth daily. 90 tablet 1  . aspirin (ASPIRIN ADULT LOW STRENGTH) 81 MG EC tablet Take 81 mg by mouth once.     . Cholecalciferol (VITAMIN D3) 50 MCG (2000 UT) capsule Take 2,000 Units by mouth daily.     . Continuous Blood Gluc Sensor (FREESTYLE LIBRE 14 DAY SENSOR) MISC 1 Device by Does not apply route every 14 (fourteen) days. 6 each 3  . desonide (DESOWEN) 0.05 % ointment Apply 1 application topically daily as needed  (skin irrtation).     . Multiple Vitamin (MULTIVITAMIN) tablet Take 1 tablet by mouth daily.      . Probiotic Product (PROBIOTIC DAILY PO) Take by mouth.    . tamsulosin (FLOMAX) 0.4 MG CAPS capsule Take 1 capsule (0.4 mg total) by mouth daily. 90 capsule 3  . warfarin (COUMADIN) 5 MG tablet     . simvastatin (ZOCOR) 40 MG tablet Take 1 tablet (40 mg total) by mouth daily. (Patient taking differently: Take 20 mg by mouth daily. ) 90 tablet 3   No current facility-administered medications on file prior to visit.    No Known Allergies  Family History  Problem Relation Age of Onset  . Crohn's disease Father   . Colon cancer Neg Hx     BP (!) 146/70   Pulse (!) 102   Ht 6\' 3"  (1.905 m)   Wt (!) 405 lb (183.7 kg)   SpO2 97%   BMI 50.62 kg/m    Review of Systems Denies LOC.      Objective:   Physical Exam VITAL SIGNS:  See vs page GENERAL: no distress Pulses: dorsalis pedis absent bilat (probably due to edema).  MSK: no deformity of the feet.  CV: 2+ bilat leg edema.   Skin: no ulcer on the feet, but the skin of feet is dry and scaly. normal color and temp on the feet. there is severe patchy hyperpigmentation at both anterior tibial areas, and the plantar aspects of both feet.   Neuro: sensation is intact to touch on the feet, but decreased from normal.  Ext: There is severe bilateral onychomycosis of the toenails.    Lab Results  Component Value Date   CREATININE 1.18 11/23/2016   BUN 26 (H) 11/23/2016   NA 142 11/23/2016   K 4.4 11/23/2016   CL 103 11/23/2016   CO2 31 11/23/2016   Lab Results  Component Value Date   HGBA1C 8.3 (A) 07/02/2019        Assessment & Plan:  HTN: is noted today Insulin-requiring type 2 DM, with CAD Hypoglycemia, due to insulin: this limits aggressiveness of glycemic control   Patient Instructions  Your blood pressure is high today.  Please see your primary care provider soon, to have it rechecked. I have sent a prescription  to your pharmacy, to add "Trulicity," and: Please reduce both insulins, as below.  check your blood sugar twice a day.  vary the time of day when you check, between before the 3 meals, and at bedtime.  also check if you have symptoms of your blood sugar being too high  or too low.  please keep a record of the readings and bring it to your next appointment here (or you can bring the meter itself).  You can write it on any piece of paper.  please call us sooner if your blood sugar goes below 70, or if you have a lot of readings over 200. Please come back for a follow-up appointment in 2-3 months.

## 2019-08-28 ENCOUNTER — Telehealth: Payer: Self-pay

## 2019-08-28 NOTE — Telephone Encounter (Signed)
[place]: [place]: Medical records request for DOS 03/24/19 and 07/02/19 ALL SIGNED AND DATED BY DR. Loanne Drilling Misc: 8 failed faxes resulted in need to mail requested information  All above requested information had been attempted to be faxed to the Company listed above but resulted in 8 failed faxes. Signed and dated office pertaining to the DOS listed above, all failed faxes and medical records request were mailed as follows:  [place] [address] ATTENTION: Anna  Copy of medical records request has been placed in the labeled and sent to HIM for future reference.

## 2019-09-24 ENCOUNTER — Telehealth: Payer: Self-pay

## 2019-09-24 NOTE — Telephone Encounter (Signed)
Received notification from [facility] indicating they still have not received records from [facility] 03/24/19 and 07/02/19. In addition, also requesting records from 09/21/19 visit which did not occur. Returned fax indicating that on 7/29/221 8 failed attempts were made to fax 03/24/19 and 07/02/19 office notes. Those records were then mailed. Advised an alternate fax # will be required.

## 2019-10-07 ENCOUNTER — Ambulatory Visit (INDEPENDENT_AMBULATORY_CARE_PROVIDER_SITE_OTHER): Payer: Medicare Other | Admitting: Endocrinology

## 2019-10-07 ENCOUNTER — Other Ambulatory Visit: Payer: Self-pay

## 2019-10-07 ENCOUNTER — Encounter: Payer: Self-pay | Admitting: Endocrinology

## 2019-10-07 VITALS — BP 130/78 | HR 105 | Ht 75.0 in | Wt >= 6400 oz

## 2019-10-07 DIAGNOSIS — E1159 Type 2 diabetes mellitus with other circulatory complications: Secondary | ICD-10-CM

## 2019-10-07 DIAGNOSIS — Z794 Long term (current) use of insulin: Secondary | ICD-10-CM | POA: Diagnosis not present

## 2019-10-07 LAB — POCT GLYCOSYLATED HEMOGLOBIN (HGB A1C): Hemoglobin A1C: 8.2 % — AB (ref 4.0–5.6)

## 2019-10-07 MED ORDER — INSULIN ASPART 100 UNIT/ML ~~LOC~~ SOLN: SUBCUTANEOUS | 11 refills | Status: DC

## 2019-10-07 MED ORDER — TRULICITY 0.75 MG/0.5ML ~~LOC~~ SOAJ: 0.7500 mg | SUBCUTANEOUS | 3 refills | Status: DC

## 2019-10-07 MED ORDER — INSULIN DETEMIR 100 UNIT/ML ~~LOC~~ SOLN: 60.0000 [IU] | Freq: Every day | SUBCUTANEOUS | 11 refills | Status: DC

## 2019-10-07 NOTE — Patient Instructions (Addendum)
I have sent a prescription to your pharmacy, to add "Trulicity," and:  Please reduce both insulins, to the numbers listed below.  check your blood sugar twice a day.  vary the time of day when you check, between before the 3 meals, and at bedtime.  also check if you have symptoms of your blood sugar being too high or too low.  please keep a record of the readings and bring it to your next appointment here (or you can bring the meter itself).  You can write it on any piece of paper.  please call us sooner if your blood sugar goes below 70, or if you have a lot of readings over 200. Please come back for a follow-up appointment in 2-3 months.

## 2019-10-07 NOTE — Progress Notes (Signed)
Subjective:    Patient ID: [name], male    DOB: Jun 24, 1940, 79 y.o.   MRN: [id number]  HPI Pt returns for f/u of diabetes mellitus:  DM type: insulin-requiring type 2. Dx'ed: 9417 Complications: CAD, CRI, foot ulcer, and PN.   Therapy: insulin since 2004.  DKA: never.   Severe hypoglycemia: never.   Pancreatitis: never.   Other: he takes multiple daily injections. He declines weight-loss surgery; he prefers syringe and vial over pens.   Interval history: pt states he feels well in general.  I reviewed continuous glucose monitor data (scanned into the record).  Glucose varies from 70-300.  It is in general lowest in the afternoon.  He did not start Trulicity.  Past Medical History:  Diagnosis Date  . Anemia, unspecified   . Arthritis   . Colitis 1997   nonspecific  . Diabetes mellitus    Type I  . DM peripheral angiopathy ([place])   . DM retinopathy ([place])   . ED (erectile dysfunction)   . Hyperlipidemia   . Multinodular goiter   . NASH (nonalcoholic steatohepatitis)   . Osteoarthritis   . Sleep apnea   . Tubular adenoma of colon     Past Surgical History:  Procedure Laterality Date  . APPENDECTOMY    . COLONOSCOPY WITH PROPOFOL N/A 03/23/2014   Procedure: COLONOSCOPY WITH PROPOFOL;  Surgeon: Ladene Artist, MD;  Location: [facility];  Service: Endoscopy;  Laterality: N/A;  . CORONARY ARTERY BYPASS GRAFT  2007   x 1  . ESOPHAGOGASTRODUODENOSCOPY  02/04/2003   normal  . ESOPHAGOGASTRODUODENOSCOPY (EGD) WITH PROPOFOL N/A 03/23/2014   Procedure: ESOPHAGOGASTRODUODENOSCOPY (EGD) WITH PROPOFOL;  Surgeon: Ladene Artist, MD;  Location: [facility];  Service: Endoscopy;  Laterality: N/A;  . THORACOTOMY  2005   S/P Right Throactomy for Empyema    Social History   Socioeconomic History  . Marital status: Married    Spouse name: Not on file  . Number of children: Not on file  . Years of education: Not on file  . Highest education level: Not on file  Occupational  History  . Occupation: Retired    Comment: laid off in 2010  Tobacco Use  . Smoking status: Former Smoker    Types: Cigarettes    Quit date: 03/21/1993    Years since quitting: 26.5  . Smokeless tobacco: Never Used  Substance and Sexual Activity  . Alcohol use: Yes    Comment: rare  . Drug use: No  . Sexual activity: Not on file  Other Topics Concern  . Not on file  Social History Narrative   Regular exercise-swims once day   Daily Caffeine Use: 1-2 cups per day   Social Determinants of Health   Financial Resource Strain:   . Difficulty of Paying Living Expenses: Not on file  Food Insecurity:   . Worried About Charity fundraiser in the Last Year: Not on file  . Ran Out of Food in the Last Year: Not on file  Transportation Needs:   . Lack of Transportation (Medical): Not on file  . Lack of Transportation (Non-Medical): Not on file  Physical Activity:   . Days of Exercise per Week: Not on file  . Minutes of Exercise per Session: Not on file  Stress:   . Feeling of Stress : Not on file  Social Connections:   . Frequency of Communication with Friends and Family: Not on file  . Frequency of Social Gatherings with Friends  and Family: Not on file  . Attends Religious Services: Not on file  . Active Member of Clubs or Organizations: Not on file  . Attends Archivist Meetings: Not on file  . Marital Status: Not on file  Intimate Partner Violence:   . Fear of Current or Ex-Partner: Not on file  . Emotionally Abused: Not on file  . Physically Abused: Not on file  . Sexually Abused: Not on file    Current Outpatient Medications on File Prior to Visit  Medication Sig Dispense Refill  . allopurinol (ZYLOPRIM) 300 MG tablet Take 1 tablet (300 mg total) by mouth daily. 90 tablet 1  . aspirin (ASPIRIN ADULT LOW STRENGTH) 81 MG EC tablet Take 81 mg by mouth once.     . Cholecalciferol (VITAMIN D3) 50 MCG (2000 UT) capsule Take 2,000 Units by mouth daily.     . Continuous  Blood Gluc Sensor (FREESTYLE LIBRE 14 DAY SENSOR) MISC 1 Device by Does not apply route every 14 (fourteen) days. 6 each 3  . desonide (DESOWEN) 0.05 % ointment Apply 1 application topically daily as needed (skin irrtation).     . Multiple Vitamin (MULTIVITAMIN) tablet Take 1 tablet by mouth daily.      . Probiotic Product (PROBIOTIC DAILY PO) Take by mouth.    . tamsulosin (FLOMAX) 0.4 MG CAPS capsule Take 1 capsule (0.4 mg total) by mouth daily. 90 capsule 3  . warfarin (COUMADIN) 5 MG tablet     . simvastatin (ZOCOR) 40 MG tablet Take 1 tablet (40 mg total) by mouth daily. (Patient taking differently: Take 20 mg by mouth daily. ) 90 tablet 3   No current facility-administered medications on file prior to visit.    No Known Allergies  Family History  Problem Relation Age of Onset  . Crohn's disease Father   . Colon cancer Neg Hx     BP 130/78   Pulse (!) 105   Ht 6\' 3"  (1.905 m)   Wt (!) 411 lb 6.4 oz (186.6 kg)   SpO2 95%   BMI 51.42 kg/m    Review of Systems He denies hypoglycemia.      Objective:   Physical Exam VITAL SIGNS:  See vs page GENERAL: no distress Pulses: dorsalis pedis absent bilat (probably due to edema).  MSK: no deformity of the feet.  CV: 3+ bilat leg edema.   Skin: no ulcer on the feet, but the skin of feet is dry and scaly. normal color and temp on the feet. there is severe patchy hyperpigmentation at both anterior tibial areas, and the plantar aspects of both feet.   Neuro: sensation is intact to touch on the feet, but decreased from normal.  Ext: There is severe bilateral onychomycosis of the toenails.    Lab Results  Component Value Date   HGBA1C 8.2 (A) 10/07/2019   Lab Results  Component Value Date   CREATININE 1.18 11/23/2016   BUN 26 (H) 11/23/2016   NA 142 11/23/2016   K 4.4 11/23/2016   CL 103 11/23/2016   CO2 31 11/23/2016       Assessment & Plan:  Insulin-requiring type 2 DM, with CRI: uncontrolled.   Patient  Instructions  I have sent a prescription to your pharmacy, to add "Trulicity," and:  Please reduce both insulins, to the numbers listed below.  check your blood sugar twice a day.  vary the time of day when you check, between before the 3 meals, and at bedtime.  also check if you have symptoms of your blood sugar being too high or too low.  please keep a record of the readings and bring it to your next appointment here (or you can bring the meter itself).  You can write it on any piece of paper.  please call us sooner if your blood sugar goes below 70, or if you have a lot of readings over 200. Please come back for a follow-up appointment in 2-3 months.

## 2020-01-07 ENCOUNTER — Ambulatory Visit (INDEPENDENT_AMBULATORY_CARE_PROVIDER_SITE_OTHER): Payer: Medicare Other | Admitting: Endocrinology

## 2020-01-07 ENCOUNTER — Other Ambulatory Visit: Payer: Self-pay

## 2020-01-07 ENCOUNTER — Encounter: Payer: Self-pay | Admitting: Endocrinology

## 2020-01-07 DIAGNOSIS — Z794 Long term (current) use of insulin: Secondary | ICD-10-CM

## 2020-01-07 DIAGNOSIS — E1159 Type 2 diabetes mellitus with other circulatory complications: Secondary | ICD-10-CM | POA: Diagnosis not present

## 2020-01-07 LAB — POCT GLYCOSYLATED HEMOGLOBIN (HGB A1C): Hemoglobin A1C: 8.7 % — AB (ref 4.0–5.6)

## 2020-01-07 MED ORDER — INSULIN ASPART 100 UNIT/ML ~~LOC~~ SOLN: SUBCUTANEOUS | 11 refills | Status: DC

## 2020-01-07 MED ORDER — INSULIN DETEMIR 100 UNIT/ML ~~LOC~~ SOLN: 80.0000 [IU] | Freq: Every day | SUBCUTANEOUS | 11 refills | Status: DC

## 2020-01-07 NOTE — Patient Instructions (Addendum)
Please change both insulins to the numbers listed below.  check your blood sugar twice a day.  vary the time of day when you check, between before the 3 meals, and at bedtime.  also check if you have symptoms of your blood sugar being too high or too low.  please keep a record of the readings and bring it to your next appointment here (or you can bring the meter itself).  You can write it on any piece of paper.  please call us sooner if your blood sugar goes below 70, or if you have a lot of readings over 200.   Please come back for a follow-up appointment in 2 months.

## 2020-01-07 NOTE — Progress Notes (Signed)
Subjective:    Patient ID: [name], male    DOB: [birth year], 79 y.o.   MRN: [id number]  HPI Pt returns for f/u of diabetes mellitus:  DM type: insulin-requiring type 2. Dx'ed: 5465 Complications: CAD, CRI, foot ulcer, and PN.  Therapy: insulin since 2004.  DKA: never.   Severe hypoglycemia: never.   Pancreatitis: never.   Other: he takes multiple daily injections. He declines weight-loss surgery; he prefers syringe and vial over pens; he eats meals at 10AM, 2PM, and 7PM.  Interval history: I reviewed continuous glucose monitor data (scanned into the record).  Glucose varies from 40-270.  It is in general lowest in the afternoon, but the lowest single episode was fasting (he does not know why that was).  He did not start Trulicity, due to cost.  He says levemir is 80 units qhs, and novolog is 3 times a day (just before each meal) 80-70-70 units.   Past Medical History:  Diagnosis Date  . Anemia, unspecified   . Arthritis   . Colitis 1997   nonspecific  . Diabetes mellitus    Type I  . DM peripheral angiopathy ([place])   . DM retinopathy ([place])   . ED (erectile dysfunction)   . Hyperlipidemia   . Multinodular goiter   . NASH (nonalcoholic steatohepatitis)   . Osteoarthritis   . Sleep apnea   . Tubular adenoma of colon     Past Surgical History:  Procedure Laterality Date  . APPENDECTOMY    . COLONOSCOPY WITH PROPOFOL N/A 03/23/2014   Procedure: COLONOSCOPY WITH PROPOFOL;  Surgeon: Ladene Artist, MD;  [facility];  Service: Endoscopy;  Laterality: N/A;  . CORONARY ARTERY BYPASS GRAFT  2007   x 1  . ESOPHAGOGASTRODUODENOSCOPY  02/04/2003   normal  . ESOPHAGOGASTRODUODENOSCOPY (EGD) WITH PROPOFOL N/A 03/23/2014   Procedure: ESOPHAGOGASTRODUODENOSCOPY (EGD) WITH PROPOFOL;  Surgeon: Ladene Artist, MD;  [facility];  Service: Endoscopy;  Laterality: N/A;  . THORACOTOMY  2005   S/P Right Throactomy for Empyema    Social History   Socioeconomic  History  . Marital status: Married    Spouse name: Not on file  . Number of children: Not on file  . Years of education: Not on file  . Highest education level: Not on file  Occupational History  . Occupation: Retired    Comment: laid off in 2010  Tobacco Use  . Smoking status: Former Smoker    Types: Cigarettes    Quit date: 03/21/1993    Years since quitting: 26.8  . Smokeless tobacco: Never Used  Substance and Sexual Activity  . Alcohol use: Yes    Comment: rare  . Drug use: No  . Sexual activity: Not on file  Other Topics Concern  . Not on file  Social History Narrative   Regular exercise-swims once day   Daily Caffeine Use: 1-2 cups per day   Social Determinants of Health   Financial Resource Strain: Not on file  Food Insecurity: Not on file  Transportation Needs: Not on file  Physical Activity: Not on file  Stress: Not on file  Social Connections: Not on file  Intimate Partner Violence: Not on file    Current Outpatient Medications on File Prior to Visit  Medication Sig Dispense Refill  . allopurinol (ZYLOPRIM) 300 MG tablet Take 1 tablet (300 mg total) by mouth daily. 90 tablet 1  . aspirin (ASPIRIN ADULT LOW STRENGTH) 81 MG EC tablet Take 81 mg  by mouth once.     . Cholecalciferol (VITAMIN D3) 50 MCG (2000 UT) capsule Take 2,000 Units by mouth daily.     . Continuous Blood Gluc Sensor (FREESTYLE LIBRE 14 DAY SENSOR) MISC 1 Device by Does not apply route every 14 (fourteen) days. 6 each 3  . desonide (DESOWEN) 0.05 % ointment Apply 1 application topically daily as needed (skin irrtation).     . Multiple Vitamin (MULTIVITAMIN) tablet Take 1 tablet by mouth daily.      . Probiotic Product (PROBIOTIC DAILY PO) Take by mouth.    . tamsulosin (FLOMAX) 0.4 MG CAPS capsule Take 1 capsule (0.4 mg total) by mouth daily. 90 capsule 3  . warfarin (COUMADIN) 5 MG tablet     . simvastatin (ZOCOR) 40 MG tablet Take 1 tablet (40 mg total) by mouth daily. (Patient taking  differently: Take 20 mg by mouth daily. ) 90 tablet 3   No current facility-administered medications on file prior to visit.    No Known Allergies  Family History  Problem Relation Age of Onset  . Crohn's disease Father   . Colon cancer Neg Hx     BP 130/62   Pulse (!) 105   Ht 6\' 3"  (1.905 m)   Wt (!) 409 lb (185.5 kg)   SpO2 93%   BMI 51.12 kg/m    Review of Systems Denies LOC    Objective:   Physical Exam VITAL SIGNS:  See vs page GENERAL: no distress Foot PE is declined    Lab Results  Component Value Date   HGBA1C 8.7 (A) 01/07/2020       Assessment & Plan:  Insulin-requiring type 2 DM, with CRI: uncontrolled.  Hypoglycemia, due to insulin: this limits aggressiveness of glycemic control.   Patient Instructions  Please change both insulins to the numbers listed below.  check your blood sugar twice a day.  vary the time of day when you check, between before the 3 meals, and at bedtime.  also check if you have symptoms of your blood sugar being too high or too low.  please keep a record of the readings and bring it to your next appointment here (or you can bring the meter itself).  You can write it on any piece of paper.  please call us sooner if your blood sugar goes below 70, or if you have a lot of readings over 200.   Please come back for a follow-up appointment in 2 months.

## 2020-02-11 ENCOUNTER — Telehealth: Payer: Self-pay | Admitting: Endocrinology

## 2020-02-11 DIAGNOSIS — Z794 Long term (current) use of insulin: Secondary | ICD-10-CM

## 2020-02-11 DIAGNOSIS — E1159 Type 2 diabetes mellitus with other circulatory complications: Secondary | ICD-10-CM

## 2020-02-11 NOTE — Telephone Encounter (Signed)
Patient called to advise that the Levemir is going to be filled as written by [facility] but he just needs a true 30 day supply of Levemir and Novolog [facility] [address] [place] to bridge the gap until [facility] [employer] can get his package shipped to him  Patient requested that we please call and leave a VM to advise when sent so he knows when to follow up and pick up at pharmacy [phone]

## 2020-02-11 NOTE — Telephone Encounter (Signed)
Patient called to request that a new RX for Levemir to be sent to his pharmacy to reflect his true dosing instructions. So that he gets 3 vials per 30 day supply - call back # [phone]

## 2020-02-11 NOTE — Telephone Encounter (Signed)
Patient called back saying he meant to say [place] [address] rather than [place] [address] in the last message.

## 2020-02-12 MED ORDER — INSULIN ASPART 100 UNIT/ML ~~LOC~~ SOLN: SUBCUTANEOUS | 0 refills | Status: DC

## 2020-02-12 MED ORDER — "SURE COMFORT INSULIN SYRINGE 31G X 5/16"" 1 ML MISC": 0 refills | Status: DC

## 2020-02-12 MED ORDER — INSULIN DETEMIR 100 UNIT/ML ~~LOC~~ SOLN: 80.0000 [IU] | Freq: Every day | SUBCUTANEOUS | 0 refills | Status: DC

## 2020-02-12 NOTE — Telephone Encounter (Signed)
Patient called again due to the [facility] has not received the new RX's request he called about yesterday (Please see previous message(s). Patient requests that new RX's for Levemir (approx 3 vials) AND Novolog (approx 9 vials ) AND for insulin syringes (2 boxes of 100 each) [facility] [address] [facility] [place]. Ph3 [phone]   Patient requested that we please call and leave a VM to advise when sent so he knows when to follow up and pick up at pharmacy [phone]

## 2020-02-12 NOTE — Telephone Encounter (Signed)
Erx sent per pt request.   Left patient a very detailed message as reqeusted at number listed [phone]. Informed via vm to contact pharmacy to confirm that his rx is ready for pick up before he goes to pick it up and to call us with any other questions or concerns.

## 2020-03-07 ENCOUNTER — Other Ambulatory Visit: Payer: Self-pay | Admitting: Endocrinology

## 2020-03-07 DIAGNOSIS — E1159 Type 2 diabetes mellitus with other circulatory complications: Secondary | ICD-10-CM

## 2020-03-07 DIAGNOSIS — Z794 Long term (current) use of insulin: Secondary | ICD-10-CM

## 2020-03-08 ENCOUNTER — Other Ambulatory Visit: Payer: Self-pay | Admitting: Endocrinology

## 2020-03-09 ENCOUNTER — Other Ambulatory Visit: Payer: Self-pay

## 2020-03-11 ENCOUNTER — Ambulatory Visit: Payer: Medicare Other | Admitting: Endocrinology

## 2020-03-30 ENCOUNTER — Other Ambulatory Visit: Payer: Self-pay | Admitting: Endocrinology

## 2020-03-30 ENCOUNTER — Telehealth: Payer: Self-pay

## 2020-03-30 DIAGNOSIS — Z794 Long term (current) use of insulin: Secondary | ICD-10-CM

## 2020-03-30 DIAGNOSIS — E1159 Type 2 diabetes mellitus with other circulatory complications: Secondary | ICD-10-CM

## 2020-03-30 NOTE — Telephone Encounter (Signed)
Inbound fax from [facility] requesting pt's last 46mos office notes. Notes routed via [place] [phone].

## 2020-04-08 ENCOUNTER — Telehealth: Payer: Self-pay | Admitting: Endocrinology

## 2020-04-08 NOTE — Telephone Encounter (Signed)
[facility] called, they would like the pt's office clinical notes from  02/15/2019-08/15/2019 faxed over to them # [phone]

## 2020-04-09 NOTE — Telephone Encounter (Signed)
OV notes from 02/15/2019-716/2021 faxed to [facility] External to [phone]

## 2020-04-19 ENCOUNTER — Ambulatory Visit (INDEPENDENT_AMBULATORY_CARE_PROVIDER_SITE_OTHER): Payer: Medicare Other | Admitting: Endocrinology

## 2020-04-19 ENCOUNTER — Other Ambulatory Visit: Payer: Self-pay

## 2020-04-19 VITALS — BP 160/74 | HR 113 | Ht 75.0 in | Wt >= 6400 oz

## 2020-04-19 DIAGNOSIS — E1159 Type 2 diabetes mellitus with other circulatory complications: Secondary | ICD-10-CM

## 2020-04-19 DIAGNOSIS — Z794 Long term (current) use of insulin: Secondary | ICD-10-CM | POA: Diagnosis not present

## 2020-04-19 LAB — POCT GLYCOSYLATED HEMOGLOBIN (HGB A1C): Hemoglobin A1C: 8.6 % — AB (ref 4.0–5.6)

## 2020-04-19 MED ORDER — INSULIN ASPART 100 UNIT/ML ~~LOC~~ SOLN: SUBCUTANEOUS | 1 refills | Status: DC

## 2020-04-19 NOTE — Patient Instructions (Addendum)
Please change the Novolog to 3 times a day (just before each meal), 80-70-80 units. check your blood sugar twice a day.  vary the time of day when you check, between before the 3 meals, and at bedtime.  also check if you have symptoms of your blood sugar being too high or too low.  please keep a record of the readings and bring it to your next appointment here (or you can bring the meter itself).  You can write it on any piece of paper.  please call us sooner if your blood sugar goes below 70, or if you have a lot of readings over 200.   Please come back for a follow-up appointment in 2-3 months.

## 2020-04-19 NOTE — Progress Notes (Unsigned)
Subjective:    Patient ID: [name], male    DOB: 08-25-1940, 80 y.o.   MRN: [id number]  HPI Pt returns for f/u of diabetes mellitus:  DM type: insulin-requiring type 2. Dx'ed: 3295 Complications: CAD, CRI, foot ulcer, and PN.  Therapy: insulin since 2004.  DKA: never.   Severe hypoglycemia: never.   Pancreatitis: never.   SDOH: he declined Trulicity, due to cost Other: he takes multiple daily injections. He declines weight-loss surgery; he prefers syringe and vial over pens; he eats meals at 10AM, 2PM, and 7PM.   Interval history:  continuous glucose monitor is downloaded, but data are minimal.  He seldom has hypoglycemia, and these episodes are mild.  Pt says cbg varies from 95-270.  It is in general highest fasting.   Past Medical History:  Diagnosis Date  . Anemia, unspecified   . Arthritis   . Colitis 1997   nonspecific  . Diabetes mellitus    Type I  . DM peripheral angiopathy ([place])   . DM retinopathy ([place])   . ED (erectile dysfunction)   . Hyperlipidemia   . Multinodular goiter   . NASH (nonalcoholic steatohepatitis)   . Osteoarthritis   . Sleep apnea   . Tubular adenoma of colon     Past Surgical History:  Procedure Laterality Date  . APPENDECTOMY    . COLONOSCOPY WITH PROPOFOL N/A 03/23/2014   Procedure: COLONOSCOPY WITH PROPOFOL;  Surgeon: Ladene Artist, MD;  Location: [facility];  Service: Endoscopy;  Laterality: N/A;  . CORONARY ARTERY BYPASS GRAFT  2007   x 1  . ESOPHAGOGASTRODUODENOSCOPY  02/04/2003   normal  . ESOPHAGOGASTRODUODENOSCOPY (EGD) WITH PROPOFOL N/A 03/23/2014   Procedure: ESOPHAGOGASTRODUODENOSCOPY (EGD) WITH PROPOFOL;  Surgeon: Ladene Artist, MD;  Location: [facility];  Service: Endoscopy;  Laterality: N/A;  . THORACOTOMY  2005   S/P Right Throactomy for Empyema    Social History   Socioeconomic History  . Marital status: Married    Spouse name: Not on file  . Number of children: Not on file  . Years of education: Not  on file  . Highest education level: Not on file  Occupational History  . Occupation: Retired    Comment: laid off in 2010  Tobacco Use  . Smoking status: Former Smoker    Types: Cigarettes    Quit date: 03/21/1993    Years since quitting: 27.1  . Smokeless tobacco: Never Used  Substance and Sexual Activity  . Alcohol use: Yes    Comment: rare  . Drug use: No  . Sexual activity: Not on file  Other Topics Concern  . Not on file  Social History Narrative   Regular exercise-swims once day   Daily Caffeine Use: 1-2 cups per day   Social Determinants of Health   Financial Resource Strain: Not on file  Food Insecurity: Not on file  Transportation Needs: Not on file  Physical Activity: Not on file  Stress: Not on file  Social Connections: Not on file  Intimate Partner Violence: Not on file    Current Outpatient Medications on File Prior to Visit  Medication Sig Dispense Refill  . allopurinol (ZYLOPRIM) 300 MG tablet Take 1 tablet (300 mg total) by mouth daily. 90 tablet 1  . aspirin 81 MG EC tablet Take 81 mg by mouth once.    . BD INSULIN SYRINGE U/F 31G X 5/16" 1 ML MISC USE TO INJECT INSULIN FOUR TIMES DAILY 200 each 5  . Cholecalciferol (  VITAMIN D3) 50 MCG (2000 UT) capsule Take 2,000 Units by mouth daily.     . Continuous Blood Gluc Sensor (FREESTYLE LIBRE 14 DAY SENSOR) MISC 1 Device by Does not apply route every 14 (fourteen) days. 6 each 3  . desonide (DESOWEN) 0.05 % ointment Apply 1 application topically daily as needed (skin irrtation).     . LEVEMIR 100 UNIT/ML injection INJECT 0.8 MLS (80 UNITS TOTAL) INTO THE SKIN AT BEDTIME. 30 mL 0  . Multiple Vitamin (MULTIVITAMIN) tablet Take 1 tablet by mouth daily.    . Probiotic Product (PROBIOTIC DAILY PO) Take by mouth.    . tamsulosin (FLOMAX) 0.4 MG CAPS capsule Take 1 capsule (0.4 mg total) by mouth daily. 90 capsule 3  . warfarin (COUMADIN) 5 MG tablet     . simvastatin (ZOCOR) 40 MG tablet Take 1 tablet (40 mg total)  by mouth daily. (Patient taking differently: Take 20 mg by mouth daily. ) 90 tablet 3   No current facility-administered medications on file prior to visit.    No Known Allergies  Family History  Problem Relation Age of Onset  . Crohn's disease Father   . Colon cancer Neg Hx     BP (!) 160/74 (BP Location: Right Arm, Patient Position: Sitting, Cuff Size: Large)   Pulse (!) 113   Ht 6\' 3"  (1.905 m)   Wt (!) 420 lb 6.4 oz (190.7 kg)   SpO2 93%   BMI 52.55 kg/m    Review of Systems     Objective:   Physical Exam VITAL SIGNS:  See vs page GENERAL: no distress Pulses: dorsalis pedis absent bilat (probably due to edema).  MSK: no deformity of the feet.  CV: 3+ bilat leg edema.   Skin: no ulcer on the feet, but the skin of feet is red, dry and scaly. normal color and temp on the feet. there is severe patchy hyperpigmentation at both anterior tibial areas, and the plantar aspects of both feet.   Neuro: sensation is intact to touch on the feet, but decreased from normal.  Ext: There is severe bilateral onychomycosis of the toenails.   A1c=8.6%     Assessment & Plan:  Insulin-requiring type 2 DM, with CRI: uncontrolled.   Patient Instructions  Please change the Novolog to 3 times a day (just before each meal), 80-70-80 units. check your blood sugar twice a day.  vary the time of day when you check, between before the 3 meals, and at bedtime.  also check if you have symptoms of your blood sugar being too high or too low.  please keep a record of the readings and bring it to your next appointment here (or you can bring the meter itself).  You can write it on any piece of paper.  please call us sooner if your blood sugar goes below 70, or if you have a lot of readings over 200.   Please come back for a follow-up appointment in 2-3 months.

## 2020-04-26 ENCOUNTER — Other Ambulatory Visit: Payer: Self-pay | Admitting: Endocrinology

## 2020-04-26 DIAGNOSIS — Z794 Long term (current) use of insulin: Secondary | ICD-10-CM

## 2020-04-26 DIAGNOSIS — E1159 Type 2 diabetes mellitus with other circulatory complications: Secondary | ICD-10-CM

## 2020-04-26 NOTE — Telephone Encounter (Signed)
Patient called to request the following RX's with  as many refills as possible:  MEDICATION:  LEVEMIR 100 UNIT/ML injection  (3 Vials)   AND  insulin aspart (NOVOLOG) 100 UNIT/ML injection  (7 Vials)   PHARMACY:   [id number] - [place], [place] - [address] Phone:  [phone]  Fax:  [phone]       HAS THE PATIENT CONTACTED THEIR PHARMACY? Yes    IS THIS A 90 DAY SUPPLY : No  IS PATIENT OUT OF MEDICATION: No  IF NOT; HOW MUCH IS LEFT: Low on Levemir-will need by 05/02/20, Novolog -will need by 06/01/20   LAST APPOINTMENT DATE: @3 /28/2022  NEXT APPOINTMENT DATE:@6 /27/2022  DO WE HAVE YOUR PERMISSION TO LEAVE A DETAILED MESSAGE?: Yes  OTHER COMMENTS:    **Let patient know to contact pharmacy at the end of the day to make sure medication is ready. **  ** Please notify patient to allow 48-72 hours to process**  **Encourage patient to contact the pharmacy for refills or they can request refills through [facility]**

## 2020-05-01 ENCOUNTER — Other Ambulatory Visit: Payer: Self-pay | Admitting: Endocrinology

## 2020-05-01 DIAGNOSIS — E1159 Type 2 diabetes mellitus with other circulatory complications: Secondary | ICD-10-CM

## 2020-05-01 DIAGNOSIS — Z794 Long term (current) use of insulin: Secondary | ICD-10-CM

## 2020-07-19 ENCOUNTER — Other Ambulatory Visit: Payer: Self-pay | Admitting: Endocrinology

## 2020-07-19 DIAGNOSIS — Z794 Long term (current) use of insulin: Secondary | ICD-10-CM

## 2020-07-19 DIAGNOSIS — E1159 Type 2 diabetes mellitus with other circulatory complications: Secondary | ICD-10-CM

## 2020-07-26 ENCOUNTER — Ambulatory Visit (INDEPENDENT_AMBULATORY_CARE_PROVIDER_SITE_OTHER): Payer: Medicare Other | Admitting: Endocrinology

## 2020-07-26 ENCOUNTER — Other Ambulatory Visit: Payer: Self-pay

## 2020-07-26 VITALS — BP 170/90 | HR 108 | Ht 75.0 in | Wt 398.4 lb

## 2020-07-26 DIAGNOSIS — E1159 Type 2 diabetes mellitus with other circulatory complications: Secondary | ICD-10-CM | POA: Diagnosis not present

## 2020-07-26 DIAGNOSIS — Z794 Long term (current) use of insulin: Secondary | ICD-10-CM | POA: Diagnosis not present

## 2020-07-26 LAB — POCT GLYCOSYLATED HEMOGLOBIN (HGB A1C): Hemoglobin A1C: 7.9 % — AB (ref 4.0–5.6)

## 2020-07-26 MED ORDER — INSULIN ASPART 100 UNIT/ML IJ SOLN: INTRAMUSCULAR | 99 refills | Status: DC

## 2020-07-26 NOTE — Patient Instructions (Addendum)
Please change the Novolog to 3 times a day (just before each meal) 90-60-80 units, check your blood sugar twice a day.  vary the time of day when you check, between before the 3 meals, and at bedtime.  also check if you have symptoms of your blood sugar being too high or too low.  please keep a record of the readings and bring it to your next appointment here (or you can bring the meter itself).  You can write it on any piece of paper.  please call us sooner if your blood sugar goes below 70, or if you have a lot of readings over 200.   Please come back for a follow-up appointment in 3 months.

## 2020-07-26 NOTE — Progress Notes (Signed)
Subjective:    Patient ID: [name], male    DOB: 1941/01/11, 80 y.o.   MRN: [id number]  HPI Pt returns for f/u of diabetes mellitus:  DM type: insulin-requiring type 2. Dx'ed: 5573 Complications: CAD, CRI, foot ulcer, and PN.  Therapy: insulin since 2004.  DKA: never.   Severe hypoglycemia: never.   Pancreatitis: never.   SDOH: he declined Trulicity, due to cost Other: he takes multiple daily injections. He declines weight-loss surgery; he prefers syringe and vial over pens; he eats meals at 9AM, 3PM, and 7PM.   Interval history:  continuous glucose monitor is downloaded.  He seldom has hypoglycemia, and these episodes are mild.  Pt says cbg varies from 65-300.  It is in general highest 10AM-12PM.  Otherwise, there is no trend throughout the day.  Past Medical History:  Diagnosis Date   Anemia, unspecified    Arthritis    Colitis 1997   nonspecific   Diabetes mellitus    Type I   DM peripheral angiopathy ([facility])    DM retinopathy ([facility])    ED (erectile dysfunction)    Hyperlipidemia    Multinodular goiter    NASH (nonalcoholic steatohepatitis)    Osteoarthritis    Sleep apnea    Tubular adenoma of colon     Past Surgical History:  Procedure Laterality Date   APPENDECTOMY     COLONOSCOPY WITH PROPOFOL N/A 03/23/2014   Procedure: COLONOSCOPY WITH PROPOFOL;  Surgeon: Ladene Artist, MD;  Location: [facility];  Service: Endoscopy;  Laterality: N/A;   CORONARY ARTERY BYPASS GRAFT  2007   x 1   ESOPHAGOGASTRODUODENOSCOPY  02/04/2003   normal   ESOPHAGOGASTRODUODENOSCOPY (EGD) WITH PROPOFOL N/A 03/23/2014   Procedure: ESOPHAGOGASTRODUODENOSCOPY (EGD) WITH PROPOFOL;  Surgeon: Ladene Artist, MD;  Location: [facility];  Service: Endoscopy;  Laterality: N/A;   THORACOTOMY  2005   S/P Right Throactomy for Empyema    Social History   Socioeconomic History   Marital status: Married    Spouse name: Not on file   Number of children: Not on file   Years of education:  Not on file   Highest education level: Not on file  Occupational History   Occupation: Retired    Comment: laid off in 2010  Tobacco Use   Smoking status: Former    Pack years: 0.00    Types: Cigarettes    Quit date: 03/21/1993    Years since quitting: 27.3   Smokeless tobacco: Never  Substance and Sexual Activity   Alcohol use: Yes    Comment: rare   Drug use: No   Sexual activity: Not on file  Other Topics Concern   Not on file  Social History Narrative   Regular exercise-swims once day   Daily Caffeine Use: 1-2 cups per day   Social Determinants of Health   Financial Resource Strain: Not on file  Food Insecurity: Not on file  Transportation Needs: Not on file  Physical Activity: Not on file  Stress: Not on file  Social Connections: Not on file  Intimate Partner Violence: Not on file    Current Outpatient Medications on File Prior to Visit  Medication Sig Dispense Refill   allopurinol (ZYLOPRIM) 300 MG tablet Take 1 tablet (300 mg total) by mouth daily. 90 tablet 1   aspirin 81 MG EC tablet Take 81 mg by mouth once.     BD INSULIN SYRINGE U/F 31G X 5/16" 1 ML MISC USE TO INJECT INSULIN FOUR  TIMES DAILY 200 each 5   Cholecalciferol (VITAMIN D3) 50 MCG (2000 UT) capsule Take 2,000 Units by mouth daily.      Continuous Blood Gluc Sensor (FREESTYLE LIBRE 14 DAY SENSOR) MISC 1 Device by Does not apply route every 14 (fourteen) days. 6 each 3   desonide (DESOWEN) 0.05 % ointment Apply 1 application topically daily as needed (skin irrtation).      LEVEMIR 100 UNIT/ML injection INJECT 0.8 MLS (80 UNITS TOTAL) INTO THE SKIN AT BEDTIME. 30 mL 1   Multiple Vitamin (MULTIVITAMIN) tablet Take 1 tablet by mouth daily.     Probiotic Product (PROBIOTIC DAILY PO) Take by mouth.     tamsulosin (FLOMAX) 0.4 MG CAPS capsule Take 1 capsule (0.4 mg total) by mouth daily. 90 capsule 3   warfarin (COUMADIN) 5 MG tablet      simvastatin (ZOCOR) 40 MG tablet Take 1 tablet (40 mg total) by mouth  daily. (Patient taking differently: Take 20 mg by mouth daily. ) 90 tablet 3   No current facility-administered medications on file prior to visit.    No Known Allergies  Family History  Problem Relation Age of Onset   Crohn's disease Father    Colon cancer Neg Hx     BP (!) 170/90 (BP Location: Right Arm, Patient Position: Sitting, Cuff Size: Large)   Pulse (!) 108   Ht 6\' 3"  (1.905 m)   Wt (!) 398 lb 6.4 oz (180.7 kg)   SpO2 92%   BMI 49.80 kg/m    Review of Systems He hjas lost weight, due to his efforts.      Objective:   Physical Exam GENERAL: no distress.     Lab Results  Component Value Date   HGBA1C 7.9 (A) 07/26/2020       Assessment & Plan:  Insulin-requiring type 2 DM: uncontrolled.  The pattern of his cbg's indicates he needs some adjustment in his therapy.   Patient Instructions  Please change the Novolog to 3 times a day (just before each meal) 90-60-80 units, check your blood sugar twice a day.  vary the time of day when you check, between before the 3 meals, and at bedtime.  also check if you have symptoms of your blood sugar being too high or too low.  please keep a record of the readings and bring it to your next appointment here (or you can bring the meter itself).  You can write it on any piece of paper.  please call us sooner if your blood sugar goes below 70, or if you have a lot of readings over 200.   Please come back for a follow-up appointment in 3 months.

## 2020-08-12 ENCOUNTER — Telehealth: Payer: Self-pay

## 2020-08-12 NOTE — Telephone Encounter (Signed)
Internal fax to [place] @ [phone] with most recent OV notes for pt.

## 2020-10-07 LAB — HM DIABETES EYE EXAM

## 2020-10-25 ENCOUNTER — Encounter: Payer: Self-pay | Admitting: Endocrinology

## 2020-10-26 ENCOUNTER — Ambulatory Visit (INDEPENDENT_AMBULATORY_CARE_PROVIDER_SITE_OTHER): Payer: Medicare Other | Admitting: Endocrinology

## 2020-10-26 ENCOUNTER — Other Ambulatory Visit: Payer: Self-pay

## 2020-10-26 VITALS — BP 130/50 | HR 100 | Ht 75.0 in | Wt >= 6400 oz

## 2020-10-26 DIAGNOSIS — E1159 Type 2 diabetes mellitus with other circulatory complications: Secondary | ICD-10-CM | POA: Diagnosis not present

## 2020-10-26 DIAGNOSIS — Z794 Long term (current) use of insulin: Secondary | ICD-10-CM | POA: Diagnosis not present

## 2020-10-26 LAB — POCT GLYCOSYLATED HEMOGLOBIN (HGB A1C): Hemoglobin A1C: 8.4 % — AB (ref 4.0–5.6)

## 2020-10-26 MED ORDER — INSULIN ASPART 100 UNIT/ML IJ SOLN: INTRAMUSCULAR | 99 refills | Status: DC

## 2020-10-26 MED ORDER — INSULIN DETEMIR 100 UNIT/ML ~~LOC~~ SOLN: 70.0000 [IU] | Freq: Every day | SUBCUTANEOUS | 11 refills | Status: DC

## 2020-10-26 NOTE — Patient Instructions (Addendum)
Please change the insulins to the numbers listed below. You should also consider adding a once per week injection, such as Trulicity, Ozempic, or Mounjaro.  Please let me know if you decide to take.   check your blood sugar twice a day.  vary the time of day when you check, between before the 3 meals, and at bedtime.  also check if you have symptoms of your blood sugar being too high or too low.  please keep a record of the readings and bring it to your next appointment here (or you can bring the meter itself).  You can write it on any piece of paper.  please call us sooner if your blood sugar goes below 70, or if you have a lot of readings over 200.   Please come back for a follow-up appointment in 2-3 months.

## 2020-10-26 NOTE — Progress Notes (Signed)
Subjective:    Patient ID: [name], male    DOB: November 20, 1940, 80 y.o.   MRN: [id number]  HPI Pt returns for f/u of diabetes mellitus:  DM type: insulin-requiring type 2. Dx'ed: 4627 Complications: CAD, CRI, foot ulcer, and PN.  Therapy: insulin since 2004.  DKA: never.   Severe hypoglycemia: never.   Pancreatitis: never.   SDOH: he declined Trulicity, due to cost Other: he takes multiple daily injections. He declines weight-loss surgery; he prefers syringe and vial over pens; he eats meals at 9AM, 3PM, and 7PM.   Interval history:  continuous glucose monitor is downloaded.  Glucose varies from 68-300.  It is in general highest at 1PM and 12MN.  It is in general lowest at [facility].  He seldom has hypoglycemia, and these episodes are mild.  It is in general highest 10AM-12PM.  Otherwise, there is no trend throughout the day.  Past Medical History:  Diagnosis Date   Anemia, unspecified    Arthritis    Colitis 1997   nonspecific   Diabetes mellitus    Type I   DM peripheral angiopathy ([facility])    DM retinopathy ([facility])    ED (erectile dysfunction)    Hyperlipidemia    Multinodular goiter    NASH (nonalcoholic steatohepatitis)    Osteoarthritis    Sleep apnea    Tubular adenoma of colon     Past Surgical History:  Procedure Laterality Date   APPENDECTOMY     COLONOSCOPY WITH PROPOFOL N/A 03/23/2014   Procedure: COLONOSCOPY WITH PROPOFOL;  Surgeon: Ladene Artist, MD;  Location: [facility];  Service: Endoscopy;  Laterality: N/A;   CORONARY ARTERY BYPASS GRAFT  2007   x 1   ESOPHAGOGASTRODUODENOSCOPY  02/04/2003   normal   ESOPHAGOGASTRODUODENOSCOPY (EGD) WITH PROPOFOL N/A 03/23/2014   Procedure: ESOPHAGOGASTRODUODENOSCOPY (EGD) WITH PROPOFOL;  Surgeon: Ladene Artist, MD;  Location: [facility];  Service: Endoscopy;  Laterality: N/A;   THORACOTOMY  2005   S/P Right Throactomy for Empyema    Social History   Socioeconomic History   Marital status: Married    Spouse  name: Not on file   Number of children: Not on file   Years of education: Not on file   Highest education level: Not on file  Occupational History   Occupation: Retired    Comment: laid off in 2010  Tobacco Use   Smoking status: Former    Types: Cigarettes    Quit date: 03/21/1993    Years since quitting: 27.6   Smokeless tobacco: Never  Substance and Sexual Activity   Alcohol use: Yes    Comment: rare   Drug use: No   Sexual activity: Not on file  Other Topics Concern   Not on file  Social History Narrative   Regular exercise-swims once day   Daily Caffeine Use: 1-2 cups per day   Social Determinants of Health   Financial Resource Strain: Not on file  Food Insecurity: Not on file  Transportation Needs: Not on file  Physical Activity: Not on file  Stress: Not on file  Social Connections: Not on file  Intimate Partner Violence: Not on file    Current Outpatient Medications on File Prior to Visit  Medication Sig Dispense Refill   allopurinol (ZYLOPRIM) 300 MG tablet Take 1 tablet (300 mg total) by mouth daily. 90 tablet 1   aspirin 81 MG EC tablet Take 81 mg by mouth once.     BD INSULIN SYRINGE U/F 31G  X 5/16" 1 ML MISC USE TO INJECT INSULIN FOUR TIMES DAILY 200 each 5   Cholecalciferol (VITAMIN D3) 50 MCG (2000 UT) capsule Take 2,000 Units by mouth daily.      Continuous Blood Gluc Sensor (FREESTYLE LIBRE 14 DAY SENSOR) MISC 1 Device by Does not apply route every 14 (fourteen) days. 6 each 3   desonide (DESOWEN) 0.05 % ointment Apply 1 application topically daily as needed (skin irrtation).      Multiple Vitamin (MULTIVITAMIN) tablet Take 1 tablet by mouth daily.     Probiotic Product (PROBIOTIC DAILY PO) Take by mouth.     tamsulosin (FLOMAX) 0.4 MG CAPS capsule Take 1 capsule (0.4 mg total) by mouth daily. 90 capsule 3   warfarin (COUMADIN) 5 MG tablet      simvastatin (ZOCOR) 40 MG tablet Take 1 tablet (40 mg total) by mouth daily. (Patient taking differently: Take 20  mg by mouth daily. ) 90 tablet 3   No current facility-administered medications on file prior to visit.    No Known Allergies  Family History  Problem Relation Age of Onset   Crohn's disease Father    Colon cancer Neg Hx     BP (!) 130/50 (BP Location: Right Arm, Patient Position: Sitting, Cuff Size: Large)   Pulse 100   Ht 6\' 3"  (1.905 m)   Wt (!) 400 lb 12.8 oz (181.8 kg)   SpO2 92%   BMI 50.10 kg/m    Review of Systems     Objective:   Physical Exam Foot PE is declined.    Lab Results  Component Value Date   HGBA1C 8.4 (A) 10/26/2020      Assessment & Plan:  Insulin-requiring type 2 DM: uncontrolled.   Hypoglycemia, due to insulin: The pattern of his cbg's indicates he needs some adjustment in his therapy  Patient Instructions  Please change the insulins to the numbers listed below. You should also consider adding a once per week injection, such as Trulicity, Ozempic, or Mounjaro.  Please let me know if you decide to take.   check your blood sugar twice a day.  vary the time of day when you check, between before the 3 meals, and at bedtime.  also check if you have symptoms of your blood sugar being too high or too low.  please keep a record of the readings and bring it to your next appointment here (or you can bring the meter itself).  You can write it on any piece of paper.  please call us sooner if your blood sugar goes below 70, or if you have a lot of readings over 200.   Please come back for a follow-up appointment in 2-3 months.

## 2021-01-25 LAB — HM DIABETES EYE EXAM

## 2021-02-03 ENCOUNTER — Other Ambulatory Visit: Payer: Self-pay

## 2021-02-03 ENCOUNTER — Ambulatory Visit (INDEPENDENT_AMBULATORY_CARE_PROVIDER_SITE_OTHER): Payer: Medicare Other | Admitting: Endocrinology

## 2021-02-03 ENCOUNTER — Telehealth: Payer: Self-pay

## 2021-02-03 VITALS — BP 144/60 | HR 103 | Ht 75.0 in | Wt >= 6400 oz

## 2021-02-03 DIAGNOSIS — E1159 Type 2 diabetes mellitus with other circulatory complications: Secondary | ICD-10-CM | POA: Diagnosis not present

## 2021-02-03 DIAGNOSIS — Z794 Long term (current) use of insulin: Secondary | ICD-10-CM

## 2021-02-03 LAB — POCT GLYCOSYLATED HEMOGLOBIN (HGB A1C): Hemoglobin A1C: 8.9 % — AB (ref 4.0–5.6)

## 2021-02-03 MED ORDER — INSULIN DETEMIR 100 UNIT/ML ~~LOC~~ SOLN: 70.0000 [IU] | Freq: Every day | SUBCUTANEOUS | 11 refills | Status: DC

## 2021-02-03 MED ORDER — BD INSULIN SYRINGE U/F 31G X 5/16" 1 ML MISC
5 refills | Status: AC
Start: 2021-02-03 — End: ?

## 2021-02-03 MED ORDER — INSULIN ASPART 100 UNIT/ML IJ SOLN: INTRAMUSCULAR | 99 refills | Status: DC

## 2021-02-03 NOTE — Progress Notes (Signed)
Subjective:    Patient ID: [name], male    DOB: 06-30-1940, 81 y.o.   MRN: [id number]  HPI Pt returns for f/u of diabetes mellitus:  DM type: insulin-requiring type 2. Dx'ed: 1696 Complications: CAD, CRI, foot ulcer, and PN.  Therapy: insulin since 2004.  DKA: never.   Severe hypoglycemia: never.   Pancreatitis: never.   SDOH: he declined Trulicity, due to cost.   Other: he takes multiple daily injections. He declines weight-loss surgery; he prefers syringe and vial over pens; he eats meals at 9AM, 3PM, and 7PM.   Interval history:  continuous glucose monitor is downloaded.  Glucose varies from 68-340.  It is in general highest at [place].  It is in general lowest at 6PM-8PM.  He seldom has hypoglycemia, and these episodes are mild.  He now takes insulin based on cbg.  pt states he feels well in general.   Past Medical History:  Diagnosis Date   Anemia, unspecified    Arthritis    Colitis 1997   nonspecific   Diabetes mellitus    Type I   DM peripheral angiopathy (HCC)    DM retinopathy (HCC)    ED (erectile dysfunction)    Hyperlipidemia    Multinodular goiter    NASH (nonalcoholic steatohepatitis)    Osteoarthritis    Sleep apnea    Tubular adenoma of colon     Past Surgical History:  Procedure Laterality Date   APPENDECTOMY     COLONOSCOPY WITH PROPOFOL N/A 03/23/2014   Procedure: COLONOSCOPY WITH PROPOFOL;  Surgeon: Ladene Artist, MD;  Location: [facility];  Service: Endoscopy;  Laterality: N/A;   CORONARY ARTERY BYPASS GRAFT  2007   x 1   ESOPHAGOGASTRODUODENOSCOPY  02/04/2003   normal   ESOPHAGOGASTRODUODENOSCOPY (EGD) WITH PROPOFOL N/A 03/23/2014   Procedure: ESOPHAGOGASTRODUODENOSCOPY (EGD) WITH PROPOFOL;  Surgeon: Ladene Artist, MD;  Location: [facility];  Service: Endoscopy;  Laterality: N/A;   THORACOTOMY  2005   S/P Right Throactomy for Empyema    Social History   Socioeconomic History   Marital status: Married    Spouse name: Not  on file   Number of children: Not on file   Years of education: Not on file   Highest education level: Not on file  Occupational History   Occupation: Retired    Comment: laid off in 2010  Tobacco Use   Smoking status: Former    Types: Cigarettes    Quit date: 03/21/1993    Years since quitting: 27.8   Smokeless tobacco: Never  Substance and Sexual Activity   Alcohol use: Yes    Comment: rare   Drug use: No   Sexual activity: Not on file  Other Topics Concern   Not on file  Social History Narrative   Regular exercise-swims once day   Daily Caffeine Use: 1-2 cups per day   Social Determinants of Health   Financial Resource Strain: Not on file  Food Insecurity: Not on file  Transportation Needs: Not on file  Physical Activity: Not on file  Stress: Not on file  Social Connections: Not on file  Intimate Partner Violence: Not on file    Current Outpatient Medications on File Prior to Visit  Medication Sig Dispense Refill   allopurinol (ZYLOPRIM) 300 MG tablet Take 1 tablet (300 mg total) by mouth daily. 90 tablet 1   aspirin 81 MG EC tablet Take 81 mg by mouth once.     Cholecalciferol (VITAMIN  D3) 50 MCG (2000 UT) capsule Take 2,000 Units by mouth daily.      Continuous Blood Gluc Sensor (FREESTYLE LIBRE 14 DAY SENSOR) MISC 1 Device by Does not apply route every 14 (fourteen) days. 6 each 3   desonide (DESOWEN) 0.05 % ointment Apply 1 application topically daily as needed (skin irrtation).      Multiple Vitamin (MULTIVITAMIN) tablet Take 1 tablet by mouth daily.     Probiotic Product (PROBIOTIC DAILY PO) Take by mouth.     tamsulosin (FLOMAX) 0.4 MG CAPS capsule Take 1 capsule (0.4 mg total) by mouth daily. 90 capsule 3   warfarin (COUMADIN) 5 MG tablet      simvastatin (ZOCOR) 40 MG tablet Take 1 tablet (40 mg total) by mouth daily. (Patient taking differently: Take 20 mg by mouth daily. ) 90 tablet 3   No current facility-administered medications on file prior to visit.     No Known Allergies  Family History  Problem Relation Age of Onset   Crohn's disease Father    Colon cancer Neg Hx     BP (!) 144/60    Pulse (!) 103    Ht 6\' 3"  (1.905 m)    Wt (!) 409 lb 9.6 oz (185.8 kg)    SpO2 96%    BMI 51.20 kg/m    Review of Systems     Objective:   Physical Exam   Lab Results  Component Value Date   HGBA1C 8.9 (A) 02/03/2021      Assessment & Plan:  Insulin-requiring type 2 DM: uncontrolled Hypoglycemia, due to insulin: I advised pt to favor GLP rx.   Patient Instructions  Please continue the same insulins.  You should take the full amount, to improve the A1c.  It is also cheaper to take Reg and NPH, from [place].   You should also consider adding Victoza.  Please let us know if insurance allows it.   check your blood sugar twice a day.  vary the time of day when you check, between before the 3 meals, and at bedtime.  also check if you have symptoms of your blood sugar being too high or too low.  please keep a record of the readings and bring it to your next appointment here (or you can bring the meter itself).  You can write it on any piece of paper.  please call us sooner if your blood sugar goes below 70, or if you have a lot of readings over 200.   Please come back for a follow-up appointment in 2-3 months.

## 2021-02-03 NOTE — Patient Instructions (Addendum)
Please continue the same insulins.  You should take the full amount, to improve the A1c.  It is also cheaper to take Reg and NPH, from [place].   You should also consider adding Victoza.  Please let us know if insurance allows it.   check your blood sugar twice a day.  vary the time of day when you check, between before the 3 meals, and at bedtime.  also check if you have symptoms of your blood sugar being too high or too low.  please keep a record of the readings and bring it to your next appointment here (or you can bring the meter itself).  You can write it on any piece of paper.  please call us sooner if your blood sugar goes below 70, or if you have a lot of readings over 200.   Please come back for a follow-up appointment in 2-3 months.

## 2021-02-03 NOTE — Telephone Encounter (Signed)
Spoke with [name] at [facility] regarding [employer] that needed some clarification.

## 2021-02-17 ENCOUNTER — Telehealth: Payer: Self-pay | Admitting: Endocrinology

## 2021-02-17 ENCOUNTER — Other Ambulatory Visit: Payer: Self-pay

## 2021-02-17 DIAGNOSIS — Z794 Long term (current) use of insulin: Secondary | ICD-10-CM

## 2021-02-17 DIAGNOSIS — E1159 Type 2 diabetes mellitus with other circulatory complications: Secondary | ICD-10-CM

## 2021-02-17 MED ORDER — INSULIN DETEMIR 100 UNIT/ML ~~LOC~~ SOLN: 100.0000 [IU] | Freq: Every day | SUBCUTANEOUS | 3 refills | Status: DC

## 2021-02-17 NOTE — Telephone Encounter (Signed)
Patient called needing refill to reflect 100 unit not the 70 units of insulin to receive prescription at a lower cost. Patient is taking 90 to 100 units at bedtime.  insulin detemir (LEVEMIR) 100 UNIT/ML injection  Per Patient 70 units is not enough to control his sugar.  Patient need new prescription sent today 02/17/2021 to: [facility] [id number] - [place], [place] Phone:  [phone]  Fax:  [phone]

## 2021-02-17 NOTE — Telephone Encounter (Signed)
Rx sent 

## 2021-05-04 ENCOUNTER — Encounter: Payer: Self-pay | Admitting: Endocrinology

## 2021-05-04 ENCOUNTER — Ambulatory Visit (INDEPENDENT_AMBULATORY_CARE_PROVIDER_SITE_OTHER): Payer: Medicare Other | Admitting: Endocrinology

## 2021-05-04 VITALS — HR 78 | Ht 75.0 in | Wt >= 6400 oz

## 2021-05-04 DIAGNOSIS — Z794 Long term (current) use of insulin: Secondary | ICD-10-CM

## 2021-05-04 DIAGNOSIS — E1159 Type 2 diabetes mellitus with other circulatory complications: Secondary | ICD-10-CM | POA: Diagnosis not present

## 2021-05-04 LAB — POCT GLYCOSYLATED HEMOGLOBIN (HGB A1C): Hemoglobin A1C: 8.8 % — AB (ref 4.0–5.6)

## 2021-05-04 MED ORDER — INSULIN DETEMIR 100 UNIT/ML ~~LOC~~ SOLN: 85.0000 [IU] | Freq: Every day | SUBCUTANEOUS | 3 refills | Status: DC

## 2021-05-04 MED ORDER — INSULIN ASPART 100 UNIT/ML IJ SOLN: INTRAMUSCULAR | 99 refills | Status: AC

## 2021-05-04 NOTE — Patient Instructions (Addendum)
Please continue the insulins as listed below.   ?check your blood sugar twice a day.  vary the time of day when you check, between before the 3 meals, and at bedtime.  also check if you have symptoms of your blood sugar being too high or too low.  please keep a record of the readings and bring it to your next appointment here (or you can bring the meter itself).  You can write it on any piece of paper.  please call us sooner if your blood sugar goes below 70, or if you have a lot of readings over 200.   ?Please come back for a follow-up appointment in 3 months.   ?

## 2021-05-04 NOTE — Progress Notes (Signed)
? ?Subjective:  ? ? Patient ID: [name], male    DOB: April 25, 1940, 81 y.o.   MRN: [id number] ? ?HPI ?Pt returns for f/u of diabetes mellitus:  ?DM type: insulin-requiring type 2. ?Dx'ed: 1995 ?Complications: CAD, CRI, foot ulcer, and PN.  ?Therapy: insulin since 2004.  ?DKA: never.   ?Severe hypoglycemia: never.   ?Pancreatitis: never.   ?SDOH: he declined GLP, due to cost.   ?Other: he takes multiple daily injections. He declines weight-loss surgery; he prefers syringe and vial over pens; he eats meals at 9AM, 3PM, and 7PM.   ?Interval history:  continuous glucose monitor is downloaded.  Glucose varies from 65-380.  It is in general highest at [place], and less so at 9PM.  It is in general lowest at [place], and less so at [facility].  It decreases overnight.  He has hypoglycemia approx 1/week, and these episodes are mild.  pt states he feels well in general.  He takes Novolog 20-80 units 3 times a day (just before each meal), according to cbg.   ?Past Medical History:  ?Diagnosis Date  ? Anemia, unspecified   ? Arthritis   ? Colitis 1997  ? nonspecific  ? Diabetes mellitus   ? Type I  ? DM peripheral angiopathy (HCC)   ? DM retinopathy ([place])   ? ED (erectile dysfunction)   ? Hyperlipidemia   ? Multinodular goiter   ? NASH (nonalcoholic steatohepatitis)   ? Osteoarthritis   ? Sleep apnea   ? Tubular adenoma of colon   ? ? ?Past Surgical History:  ?Procedure Laterality Date  ? APPENDECTOMY    ? COLONOSCOPY WITH PROPOFOL N/A 03/23/2014  ? Procedure: COLONOSCOPY WITH PROPOFOL;  Surgeon: Ladene Artist, MD;  Location: [facility];  Service: Endoscopy;  Laterality: N/A;  ? CORONARY ARTERY BYPASS GRAFT  2007  ? x 1  ? ESOPHAGOGASTRODUODENOSCOPY  02/04/2003  ? normal  ? ESOPHAGOGASTRODUODENOSCOPY (EGD) WITH PROPOFOL N/A 03/23/2014  ? Procedure: ESOPHAGOGASTRODUODENOSCOPY (EGD) WITH PROPOFOL;  Surgeon: Ladene Artist, MD;  Location: [facility];  Service: Endoscopy;  Laterality: N/A;  ? THORACOTOMY  2005  ? S/P Right Throactomy  for Empyema  ? ? ?Social History  ? ?Socioeconomic History  ? Marital status: Married  ?  Spouse name: Not on file  ? Number of children: Not on file  ? Years of education: Not on file  ? Highest education level: Not on file  ?Occupational History  ? Occupation: Retired  ?  Comment: laid off in 2010  ?Tobacco Use  ? Smoking status: Former  ?  Types: Cigarettes  ?  Quit date: 03/21/1993  ?  Years since quitting: 28.1  ? Smokeless tobacco: Never  ?Substance and Sexual Activity  ? Alcohol use: Yes  ?  Comment: rare  ? Drug use: No  ? Sexual activity: Not on file  ?Other Topics Concern  ? Not on file  ?Social History Narrative  ? Regular exercise-swims once day  ? Daily Caffeine Use: 1-2 cups per day  ? ?Social Determinants of Health  ? ?Financial Resource Strain: Not on file  ?Food Insecurity: Not on file  ?Transportation Needs: Not on file  ?Physical Activity: Not on file  ?Stress: Not on file  ?Social Connections: Not on file  ?Intimate Partner Violence: Not on file  ? ? ?Current Outpatient Medications on File Prior to Visit  ?Medication Sig Dispense Refill  ? allopurinol (ZYLOPRIM) 300 MG tablet Take 1 tablet (300 mg total) by mouth daily.  90 tablet 1  ? aspirin 81 MG EC tablet Take 81 mg by mouth once.    ? BD INSULIN SYRINGE U/F 31G X 5/16" 1 ML MISC USE TO INJECT INSULIN THREE TIMES DAILY 200 each 5  ? Cholecalciferol (VITAMIN D3) 50 MCG (2000 UT) capsule Take 2,000 Units by mouth daily.     ? Continuous Blood Gluc Sensor (FREESTYLE LIBRE 14 DAY SENSOR) MISC 1 Device by Does not apply route every 14 (fourteen) days. 6 each 3  ? desonide (DESOWEN) 0.05 % ointment Apply 1 application topically daily as needed (skin irrtation).     ? Multiple Vitamin (MULTIVITAMIN) tablet Take 1 tablet by mouth daily.    ? Probiotic Product (PROBIOTIC DAILY PO) Take by mouth.    ? tamsulosin (FLOMAX) 0.4 MG CAPS capsule Take 1 capsule (0.4 mg total) by mouth daily. 90 capsule 3  ? warfarin (COUMADIN) 5 MG tablet     ? simvastatin  (ZOCOR) 40 MG tablet Take 1 tablet (40 mg total) by mouth daily. (Patient taking differently: Take 20 mg by mouth daily. ) 90 tablet 3  ? ?No current facility-administered medications on file prior to visit.  ? ? ?No Known Allergies ? ?Family History  ?Problem Relation Age of Onset  ? Crohn's disease Father   ? Colon cancer Neg Hx   ? ? ?Pulse 78   Ht '6\' 3"'$  (1.905 m)   Wt (!) 408 lb (185.1 kg)   SpO2 91%   BMI 51.00 kg/m?  ? ? ?Review of Systems ? ?   ?Objective:  ? Physical Exam ?VITAL SIGNS:  See vs page ?GENERAL: no distress ? ? ?Lab Results  ?Component Value Date  ? HGBA1C 8.8 (A) 05/04/2021  ? ?   ?Assessment & Plan:  ?Insulin-requiring type 2 DM: uncontrolled.  Rx is limited by taking Novolog SS, rather than using anticipatory dosing.   ? ?Patient Instructions  ?Please continue the insulins as listed below.   ?check your blood sugar twice a day.  vary the time of day when you check, between before the 3 meals, and at bedtime.  also check if you have symptoms of your blood sugar being too high or too low.  please keep a record of the readings and bring it to your next appointment here (or you can bring the meter itself).  You can write it on any piece of paper.  please call us sooner if your blood sugar goes below 70, or if you have a lot of readings over 200.   ?Please come back for a follow-up appointment in 3 months.   ? ? ?

## 2021-05-10 ENCOUNTER — Encounter: Payer: Self-pay | Admitting: Endocrinology

## 2021-05-11 ENCOUNTER — Telehealth: Payer: Self-pay

## 2021-05-11 NOTE — Telephone Encounter (Signed)
Pharmacy requesting clarification on RX for Novolog. Wants to know if the insulin is 80-100 units with breakfast, 50 units with lunch and 0 units with dinner. Please clarify for pharmacist. ?

## 2021-07-26 ENCOUNTER — Other Ambulatory Visit: Payer: Self-pay

## 2021-07-26 DIAGNOSIS — E1159 Type 2 diabetes mellitus with other circulatory complications: Secondary | ICD-10-CM

## 2021-07-26 DIAGNOSIS — Z794 Long term (current) use of insulin: Secondary | ICD-10-CM

## 2021-07-26 MED ORDER — INSULIN DETEMIR 100 UNIT/ML ~~LOC~~ SOLN: 85.0000 [IU] | Freq: Every day | SUBCUTANEOUS | 3 refills | Status: AC

## 2021-08-09 ENCOUNTER — Telehealth: Payer: Self-pay

## 2021-08-09 NOTE — Telephone Encounter (Signed)
Spoke with the patient in regards to scheduling a follow up visit with another provider here at the practice, says that he has a new endo scheduled that will be taking over his DM management.

## 2022-12-15 ENCOUNTER — Encounter: Payer: Self-pay | Admitting: Gastroenterology

## 2023-01-19 ENCOUNTER — Ambulatory Visit: Payer: Medicare Other | Admitting: Gastroenterology
